# Patient Record
Sex: Female | Born: 1935 | Race: White | Hispanic: No | Smoking: Former smoker
Health system: Southern US, Community
[De-identification: ages and names within clinical notes are randomized; demographics above are authoritative.]

## PROBLEM LIST (undated history)

## (undated) DIAGNOSIS — I10 Essential (primary) hypertension: Secondary | ICD-10-CM

## (undated) DIAGNOSIS — E785 Hyperlipidemia, unspecified: Secondary | ICD-10-CM

## (undated) DIAGNOSIS — T50995A Adverse effect of other drugs, medicaments and biological substances, initial encounter: Secondary | ICD-10-CM

## (undated) DIAGNOSIS — E119 Type 2 diabetes mellitus without complications: Secondary | ICD-10-CM

## (undated) DIAGNOSIS — Z9289 Personal history of other medical treatment: Secondary | ICD-10-CM

## (undated) DIAGNOSIS — M1990 Unspecified osteoarthritis, unspecified site: Secondary | ICD-10-CM

## (undated) DIAGNOSIS — K760 Fatty (change of) liver, not elsewhere classified: Secondary | ICD-10-CM

## (undated) DIAGNOSIS — I639 Cerebral infarction, unspecified: Secondary | ICD-10-CM

## (undated) DIAGNOSIS — I2510 Atherosclerotic heart disease of native coronary artery without angina pectoris: Secondary | ICD-10-CM

## (undated) DIAGNOSIS — I350 Nonrheumatic aortic (valve) stenosis: Secondary | ICD-10-CM

## (undated) DIAGNOSIS — I340 Nonrheumatic mitral (valve) insufficiency: Secondary | ICD-10-CM

## (undated) DIAGNOSIS — G459 Transient cerebral ischemic attack, unspecified: Secondary | ICD-10-CM

## (undated) DIAGNOSIS — I480 Paroxysmal atrial fibrillation: Secondary | ICD-10-CM

## (undated) DIAGNOSIS — K635 Polyp of colon: Secondary | ICD-10-CM

## (undated) DIAGNOSIS — K219 Gastro-esophageal reflux disease without esophagitis: Secondary | ICD-10-CM

## (undated) DIAGNOSIS — F0390 Unspecified dementia without behavioral disturbance: Secondary | ICD-10-CM

## (undated) DIAGNOSIS — I255 Ischemic cardiomyopathy: Secondary | ICD-10-CM

## (undated) HISTORY — DX: Hyperlipidemia, unspecified: E78.5

## (undated) HISTORY — DX: Fatty (change of) liver, not elsewhere classified: K76.0

## (undated) HISTORY — PX: CATARACT EXTRACTION W/ INTRAOCULAR LENS  IMPLANT, BILATERAL: SHX1307

## (undated) HISTORY — PX: ABDOMINAL HYSTERECTOMY: SHX81

## (undated) HISTORY — DX: Polyp of colon: K63.5

## (undated) HISTORY — DX: Essential (primary) hypertension: I10

## (undated) HISTORY — DX: Gastro-esophageal reflux disease without esophagitis: K21.9

## (undated) HISTORY — PX: APPENDECTOMY: SHX54

---
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
## 1999-06-06 HISTORY — PX: CARDIAC CATHETERIZATION: SHX172

## 2000-02-17 ENCOUNTER — Ambulatory Visit (HOSPITAL_COMMUNITY): Admission: RE | Admit: 2000-02-17 | Discharge: 2000-02-17 | Payer: Self-pay | Admitting: Cardiology

## 2004-07-04 ENCOUNTER — Ambulatory Visit: Payer: Self-pay | Admitting: Internal Medicine

## 2005-09-12 ENCOUNTER — Ambulatory Visit: Payer: Self-pay | Admitting: Internal Medicine

## 2006-03-19 ENCOUNTER — Ambulatory Visit: Payer: Self-pay | Admitting: Internal Medicine

## 2006-11-01 ENCOUNTER — Ambulatory Visit: Payer: Self-pay | Admitting: Internal Medicine

## 2007-01-18 ENCOUNTER — Ambulatory Visit (HOSPITAL_COMMUNITY): Admission: RE | Admit: 2007-01-18 | Discharge: 2007-01-18 | Payer: Self-pay | Admitting: Internal Medicine

## 2007-01-18 ENCOUNTER — Ambulatory Visit: Payer: Self-pay | Admitting: Internal Medicine

## 2007-01-18 ENCOUNTER — Encounter: Payer: Self-pay | Admitting: Internal Medicine

## 2007-07-25 ENCOUNTER — Ambulatory Visit (HOSPITAL_COMMUNITY): Admission: RE | Admit: 2007-07-25 | Discharge: 2007-07-25 | Payer: Self-pay | Admitting: Ophthalmology

## 2007-08-22 ENCOUNTER — Ambulatory Visit (HOSPITAL_COMMUNITY): Admission: RE | Admit: 2007-08-22 | Discharge: 2007-08-22 | Payer: Self-pay | Admitting: Ophthalmology

## 2007-09-26 ENCOUNTER — Ambulatory Visit: Payer: Self-pay | Admitting: Internal Medicine

## 2008-09-29 DIAGNOSIS — I2510 Atherosclerotic heart disease of native coronary artery without angina pectoris: Secondary | ICD-10-CM

## 2008-09-29 DIAGNOSIS — Z8639 Personal history of other endocrine, nutritional and metabolic disease: Secondary | ICD-10-CM

## 2008-09-29 DIAGNOSIS — I10 Essential (primary) hypertension: Secondary | ICD-10-CM | POA: Insufficient documentation

## 2008-09-29 DIAGNOSIS — I517 Cardiomegaly: Secondary | ICD-10-CM

## 2008-09-29 DIAGNOSIS — E785 Hyperlipidemia, unspecified: Secondary | ICD-10-CM | POA: Insufficient documentation

## 2008-09-29 DIAGNOSIS — Z860100 Personal history of colon polyps, unspecified: Secondary | ICD-10-CM | POA: Insufficient documentation

## 2008-09-29 DIAGNOSIS — K219 Gastro-esophageal reflux disease without esophagitis: Secondary | ICD-10-CM

## 2008-09-29 DIAGNOSIS — J309 Allergic rhinitis, unspecified: Secondary | ICD-10-CM | POA: Insufficient documentation

## 2008-09-29 DIAGNOSIS — K7689 Other specified diseases of liver: Secondary | ICD-10-CM

## 2008-09-29 DIAGNOSIS — Z862 Personal history of diseases of the blood and blood-forming organs and certain disorders involving the immune mechanism: Secondary | ICD-10-CM

## 2008-09-30 ENCOUNTER — Ambulatory Visit: Payer: Self-pay | Admitting: Internal Medicine

## 2008-09-30 DIAGNOSIS — E119 Type 2 diabetes mellitus without complications: Secondary | ICD-10-CM

## 2008-09-30 DIAGNOSIS — R198 Other specified symptoms and signs involving the digestive system and abdomen: Secondary | ICD-10-CM

## 2008-12-29 ENCOUNTER — Encounter (INDEPENDENT_AMBULATORY_CARE_PROVIDER_SITE_OTHER): Payer: Self-pay

## 2009-01-13 ENCOUNTER — Encounter: Payer: Self-pay | Admitting: Internal Medicine

## 2009-08-20 ENCOUNTER — Encounter (INDEPENDENT_AMBULATORY_CARE_PROVIDER_SITE_OTHER): Payer: Self-pay | Admitting: *Deleted

## 2009-10-13 ENCOUNTER — Ambulatory Visit: Payer: Self-pay | Admitting: Internal Medicine

## 2009-10-27 ENCOUNTER — Encounter: Payer: Self-pay | Admitting: Internal Medicine

## 2010-02-10 ENCOUNTER — Encounter (INDEPENDENT_AMBULATORY_CARE_PROVIDER_SITE_OTHER): Payer: Self-pay | Admitting: *Deleted

## 2010-03-18 ENCOUNTER — Encounter: Payer: Self-pay | Admitting: Internal Medicine

## 2010-04-04 ENCOUNTER — Ambulatory Visit: Payer: Self-pay | Admitting: Internal Medicine

## 2010-04-04 ENCOUNTER — Ambulatory Visit (HOSPITAL_COMMUNITY): Admission: RE | Admit: 2010-04-04 | Discharge: 2010-04-04 | Payer: Self-pay | Admitting: Internal Medicine

## 2010-07-05 NOTE — Letter (Signed)
Summary: TRIAGE ORDER  TRIAGE ORDER   Imported By: Sofie Rower 03/18/2010 11:03:34  _____________________________________________________________________  External Attachment:    Type:   Image     Comment:   External Document

## 2010-07-05 NOTE — Letter (Signed)
Summary: Recall, Screening Colonoscopy Only  [facility]  [address]   [place], [place] [address]   Phone: [phone]  Fax: [phone]    February 10, 2010  CHEYLA DUCHEMIN [address] [place], [place]  [address] Sep 22, 1927   Dear Ms. Modena Nunnery,   [facility] records indicate it is time to schedule your colonoscopy.    Please call our office at [phone] and ask for the nurse.   Thank you,    Burnadette Peter, LPN [facility], [facility] Ph: [phone]   Fax: [phone]

## 2010-07-05 NOTE — Assessment & Plan Note (Signed)
Summary: FU OV 1 YR WITH RMR PER LSL,NASH,GERD/AMS   Visit Type:  Follow-up Visit Primary Care Provider:  tapper  Chief Complaint:  follow up- gerd and nash.  History of Present Illness: Followup history of colonic polyps-tubulovillous adenoma removed from her  colon in 2008, GERD, NASH. Patient is doing well from GI standpoint reflux symptoms well-controlled with Prilosec 20 mg orally daily. Has been on ursodiol 300 mg orally twice daily and vitamin E 800 IUs daily.  Mild constipation over the past couple of months since she fell. She denies rectal bleeding.  Last LFTs through Dr. Rayna Sexton office in August 2010 were normal. Patient fell in the bathtub 3 months and has been having significant left shoulder pain since that time. 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   Wants to be put off colonoscopy until this fall.  Preventive Screening-Counseling & Management  Alcohol-Tobacco     Smoking Status: quit  Current Problems (verified): 1)  Change in Bowels  (ICD-787.99) 2)  Dm  (ICD-250.00) 3)  Hypercholesterolemia  (ICD-272.0) 4)  Coronary Artery Disease  (ICD-414.00) 5)  Ventricular Hypertrophy, Left  (ICD-429.3) 6)  Hypertension  (ICD-401.9) 7)  Allergic Rhinitis, Chronic  (ICD-477.9) 8)  Colonic Polyps, Hx of  (ICD-V12.72) 9)  Gastroesophageal Reflux Disease, Hx of  (ICD-V12.79) 10)  Other Chronic Nonalcoholic Liver Disease  (JQZ-009.2) 11)  Liver Function Tests, Abnormal, Hx of  (ICD-V12.2)  Current Medications (verified): 1)  Atenolol 50 Mg Tabs (Atenolol) .... Once Daily 2)  Hydrochlorothiazide 25 Mg Tabs (Hydrochlorothiazide) .... Once Daily 3)  Allegra 180 Mg Tabs (Fexofenadine Hcl) .... Once Daily 4)  Nasonex 50 Mcg/act Susp (Mometasone Furoate) .... Once Daily 5)  Calcuim 600 + D .... Two Times A Day 6)  Simvastatin 20 Mg Tabs (Simvastatin) .... Once Daily 7)  Multivitamins  Tabs (Multiple Vitamin) .... Once Daily 8)  Vit E 400iu .... Two  Times A Day 9)  Zetia 10 Mg Tabs (Ezetimibe) .... Once Daily 10)  Lisinopril 40 Mg Tabs (Lisinopril) .... Once Daily 11)  Metformin Hcl 500 Mg Tabs (Metformin Hcl) .... Once Daily 12)  Prilosec Otc 20 Mg Tbec (Omeprazole Magnesium) .... Once Daily 13)  Ursodiol 300 Mg Caps (Ursodiol) .... Take 2 Capsules Every Day  Allergies (verified): 1)  ! Pcn 2)  ! * Ivp Dye 3)  ! Sulfa  Family History: Father: deceased Mother: deceased Siblings: 1 brother No FH of Colon Cancer:  Social History: Marital Status: Married Children: 2 Occupation: retired Patient is a former smoker.  Alcohol Use - no Smoking Status:  quit  Vital Signs:  Patient profile:   77 year old female Height:      64 inches Weight:      165 pounds BMI:     28.42 Temp:     98.0 degrees F oral Pulse rate:   88 / minute BP sitting:   112 / 70  (left arm) Cuff size:   regular  Vitals Entered By: Burnadette Peter LPN (Oct 13, 3298 7:62 AM)  Physical Exam  General:  pleasant lady appearing at baseline. Eyes:  no scleral icterus. Lungs:  clear to auscultation Heart:  regular irregular rhythm without murmur gallop rub Abdomen:  nondistended positive bowel sounds soft nontender liver palpable the right costal margin not enlarged by percussion. I do not appreciate a spleen today. No obvious ascites. Msk:  marked left shoulder clavicle tenderness diminished range of motion no bony deformity appreciated.  Impression & Recommendations: Impression: Pleasant 77 year old lady with history of tubulovillous adenoma removed from her colon in 2008.  No GI symptoms aside from  mild constipation these days;  She needs a surveillance colonoscopy. I recommend she have one now. Patient wants to put colonoscopy off until this fall.  GERD symptoms well controlled on Prilosec. We continue that regimen.  History of NASH. Normal LFTs August 20010; will repeat a a liver profile profile now.  She's had significant left shoulder pain since the  fall. Urged her to get back to Dr. Scotty Court ASAP for further evaluation of her symptoms.  We'll put her back on the call list to bring her back in September of this year for a followup colonoscopy.  Other Orders: T-Hepatic Function [phone])  Appended Document: Orders Update    Clinical Lists Changes  Orders: Added new Service order of Est. Patient Level III (92341) - Signed      Appended Document: FU OV 1 YR WITH RMR PER LSL,NASH,GERD/AMS reminder in computer

## 2010-07-05 NOTE — Letter (Signed)
Summary: [facility] Appointment Letter  [facility]  [address]   [place], [place] [address]   Phone: [phone]  Fax: [phone]    08/20/2009  CONTINA STRAIN [address] [place], [place]  [address] 1927-12-12  Dear Ms. Modena Nunnery,   Your physician has indicated that:   _______it is time to schedule an appointment.   _______you missed your appointment on______ and need to call and  reschedule.   _______you need to have lab work done.   _______you need to schedule an appointment to discuss lab or test results.   _______you need to call to reschedule your appointment that was scheduled on _________.   Please call our office at  [phone].    Thank you,    [facility] Ph: [phone]   Fax: [phone]

## 2010-08-17 LAB — GLUCOSE, CAPILLARY: Glucose-Capillary: 118 mg/dL — ABNORMAL HIGH (ref 70–99)

## 2010-10-18 NOTE — Op Note (Signed)
NAMEMESCAL, FLINCHBAUGH                 ACCOUNT NO.:  [id number]   MEDICAL RECORD NO.:  [id number]          PATIENT TYPE:  AMB   LOCATION:  DAY                           FACILITY:  [facility]   PHYSICIAN:  R. Garfield Cornea, M.D. DATE OF BIRTH:  1927-08-04   DATE OF PROCEDURE:  01/18/2007  DATE OF DISCHARGE:                                PROCEDURE NOTE   PROCEDURE:  Colonoscopy with snare polypectomy.   ENDOSCOPIST:  Bridgette Habermann, M.D.   INDICATIONS FOR PROCEDURE:  [age]-year-old lady with a history of  polyps on a prior colonoscopy.  Her last colonoscopy was in [place] in  2003, done by Dr. Lindalou Hose.  Reportedly, she had no polyps at that  time, but did 2 years prior to that exam.  She has no lower GI tract  symptoms.  There is no family history of colorectal neoplasia.  Colonoscopy is now being done as a surveillance maneuver.  This approach  has discussed with the patient at length.  Potential risks, benefits and  alternatives have been reviewed and questions answered; she is  agreeable.  Please see documentation in the medical record.   PROCEDURE NOTE:  O2 saturation, blood pressure, pulse and respirations  were monitored throughout the entire procedure.   CONSCIOUS SEDATION:  Versed 3 mg IV and Demerol 50 mg IV in divided  doses.   INSTRUMENT:  Pentax video chip system.   FINDINGS:  Digital rectal exam revealed no abnormalities.   ENDOSCOPIC FINDINGS:  Prep was adequate.   COLON:  Colonic mucosa was surveyed from the rectosigmoid junction  through the left, transverse and right colon to the area of the  appendical orifice, ileocecal valve and cecum; these structures were  well seen and photographed for the record.  From this level, the scope  was slowly withdrawn and all previously mentioned mucosal surfaces were  again seen.  The patient was noted to have a 5-mm polyp on a stalk in  the mid ascending colon; it was removed with hot snare cautery technique  and recovered  through the scope.  The remainder of the colonic mucosa  appeared normal.  Scope was pulled down to the rectum and a thorough  examination of the rectal mucosa including a retroflexed view of the  anal verge was undertaken.  The patient had an 8-mm pedunculated polyp  and approximately 10 cm from the anal verge; it was resected with hot  snare cautery technique and recovered.  The remaining rectal mucosa  appeared normal.  The patient tolerated the procedure well and was  reactive after endoscopy.   IMPRESSION:  1. Pedunculated rectal polyp, removed as described above, otherwise      normal rectum.  2. Ascending colon polyp removed with hot snare cautery technique.      Remainder of the colon mucosa appeared normal.   RECOMMENDATIONS:  1. No aspirin or arthritis medications for the next 10 days.  2. Follow up on path.  3. Further recommendations to follow.      Bridgette Habermann, M.D.  Electronically Signed     RMR/MEDQ  D:  01/18/2007  T:  01/19/2007  Job:  [id number]   cc:   Matthias Hughs  Fax: [phone]

## 2010-10-18 NOTE — H&P (Signed)
[name], [name]                 ACCOUNT NO.:  [id number]   MEDICAL RECORD NO.:  [id number]          PATIENT TYPE:  AMB   LOCATION:                                FACILITY:  [facility]   PHYSICIAN:  R. Garfield Cornea, M.D. DATE OF BIRTH:  05/11/1928   DATE OF ADMISSION:  DATE OF DISCHARGE:  LH                              HISTORY & PHYSICAL   CHIEF COMPLAINT:  History of colonic polyps.  Due for surveillance  colonoscopy.  Also history of nonalcoholic steatohepatitis, elevated  liver enzymes.   [name] is a pleasant 77 year old lady who last underwent colonoscopy by  Dr. Benson Norway back 5 years ago.  She is overdue for surveillance  and wishes to have her colonoscopy done by me.   It is interesting to note the reason we brought this nice lady in today  is because she has a history of nonalcoholic steatohepatitis, and we  received a hepatic profile from [facility] 2 days ago which revealed  aminotransferases at nearly 1000 with her name, date of birth, telephone  number, etc., on the lab report.  These labs were drawn on Oct 30, 2006.  However it is more interesting to note that [name] tells me  unequivocally that she has not had any lab work drawn (no phlebotomy) in  [place] this month.  The last set of labs she had was done over at  the [facility] in [place] back on September 19, 2006 orchestrated  by Dr. Scotty Court, at which time her GPT and GOT were normal and her  alkaline phosphatase was minimally elevated to 119.   This obviously presents a significant problem for someone else and will  be investigated further, but at this point in time I would conclude that  the hepatic profile on the chart from May 27 belongs to some other  patient.   Ms. Lentsch has not any melena or rectal bleeding.  She does have some  difficulty with personal hygiene, getting clean after she has a bowel  movement.  She is really not taking any fiber supplementation.  Reportedly prior  colonoscopy demonstrated aside from polyps only some  hemorrhoids.  I first saw this lady originally back in February 2004 for  elevated liver enzymes.  She did have mildly elevated transaminase at  that time and alkaline phosphatase.  Iron studies were not elevated.  AMA and ANA came back negative.  Anti-smooth muscle antibody came back  minimally elevated at 120.  Hepatitis B antibody and hepatitis B surface  antigen were negative at that time.  Ultrasound of the abdomen  demonstrated diffuse fatty infiltration of the liver with some  hepatomegaly.  Gallbladder appeared normal.   [name] has been able to accomplish significant weight loss in an  attempt to obtain a more healthy BMI and help with her steatohepatitis.  She has been maintained on Actigall and vitamin E since 2004.  Over the  past 3-4 years her GPT has been minimally elevated in the 50 range.  Alkaline phosphatase has been in the 1-teens to 120 to  130 range.  She  does have some typical gastroesophageal reflux symptoms which are well  controlled on Prilosec 20 mg orally daily.   PAST MEDICAL HISTORY:  1. Chronic allergic rhinitis.  2. Hypertension.  3. Left ventricular hypertrophy.  4. Nonobstructive coronary artery disease.  5. Hypercholesterolemia.  6. Gastroesophageal reflux disease.   PAST SURGERIES:  1. Complete hysterectomy.  2. Appendectomy.  3. Colonoscopies in the past by Dr. Lindalou Hose.   CURRENT MEDICATIONS:  1. Atenolol 50 mg daily.  2. Hydrochlorothiazide 25 mg daily.  3. Allegra 180 mg daily.  4. Nasonex 50 mcg daily.  5. Calcium 200 international units with vitamin D daily.  6. Simvastatin 40 mg daily.  7. Vitamin E 400 units b.i.d. daily.  8. Zetia 10 mg daily.  9. Lisinopril 40 mg daily.  10.Metformin 500 mg daily.  11.Multivitamin daily.  12.Prilosec 20 mg daily.   ALLERGIES:  PENICILLIN, IODINE, SULFA.   SOCIAL HISTORY:  The patient has been married for 52 years.  She has 2  children.   She is a retired Education officer, museum.  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  She is now a nonsmoker, has not smoked  in 15 years.  No alcohol or illicit drug use.   REVIEW OF SYSTEMS:  No recent chest pain or dyspnea on exertion.  Weight  has been stable recently.  She has lost 20 pounds since she first  started seeing me in 2004   PHYSICAL EXAMINATION:  Pleasant 78-year lady resting comfortably.  Weight 167.5, height 5 feet 2-1/2 inches, temperature 97.4, BP 120/60,  pulse 60.  SKIN:  Warm and dry.  There is no jaundice or cutaneous stigmata of  chronic liver disease.  HEENT EXAM:  No scleral icterus.  Conjunctivae are pink.  CHEST:  Lungs are clear to auscultation.  Heart has a regular rate and  rhythm without murmur, gallop or rub.  BREAST EXAM:  Deferred.  ABDOMEN:  Nondistended.  Positive bowel sounds, soft.  Liver edge  palpable at right costal margin.  No appreciable mass or splenomegaly.  EXTREMITIES:  No edema.  RECTAL EXAM:  Deferred until time of colonoscopy.   Labs from Dr. Rayna Sexton office on Oct 19, 2006:  Albumin is 3.9, alkaline  phosphatase 119, SGOT 19, SGPT 19, total bilirubin 0.6.   IMPRESSION:  [name] is a very pleasant 77 year old lady with a  history of nonalcoholic steatohepatitis with now minimal elevation in  alkaline phosphatase.  She has had some difficulties with personal  hygiene with having a bowel movement and a distant history of colonic  polyps.  She is due for colonoscopy now.   As far as polyp surveillance is concerned, will go ahead and set her up  for colonoscopy at her convenience; however, she does want to wait until  August until the hay season is over with.  Potential risks, benefits and  alternatives have been reviewed and questions answered.  She is  agreeable.  I am going to try her on some Metamucil one dose daily and see if this does not facilitate personal hygiene.   Would give some consideration to  backing off or stopping the Actigall.  This lady has been able to lose a good bit of weight and the efficacy of  Actigall in this setting may be somewhat limited.  The same could be  said for vitamin E supplementation at this time.   As stated above, we  have a hepatic profile from [facility] with this  nice lady's identifying information on it with significantly elevated  liver enzymes, but just simply based on the fact that Ms. Truman Hayward has not  had any labs drawn in [place] this month makes me conclude that these  labs pertain to another patient and this matter will be continue to be  investigated by our staff.  Will make further recommendations in the  very near future.      Bridgette Habermann, M.D.  Electronically Signed     RMR/MEDQ  D:  11/01/2006  T:  11/01/2006  Job:  [id number]   cc:   Matthias Hughs  Fax: [phone]

## 2010-10-18 NOTE — Assessment & Plan Note (Signed)
[name], [name]                  CHART#:  [id number]   DATE:  09/26/2007                       DOB:  [birth year]   Follow-up history of elevated LFTs, more recently predominantly elevated  alkaline phosphatase, nonalcoholic fatty liver disease (NASH), history  of gastroesophageal flux disease.  [name] was last seen by me on January 18, 2007 at which time she underwent a colonoscopy and she was found to  have colonic polyps which were removed.  She had tubular adenoma and  tubulovillous adenoma and it was recommendation she return in 3 years  for a follow-up examination.  She has been on Prilosec 20 mg orally  daily in lieu of Prevacid secondary to insurance constraints.  On  balance she liked Prevacid a little better than Prilosec but by and  large Prilosec is doing a pretty good job with controlling her reflux  symptoms.  She is having no odynophagia or dysphagia.  She is on vitamin  E and Ursodiol.  Her alkaline phosphatase has steadily improved. The  last assay I have is from September 16, 2007 when Dr. Scotty Court ordered LFTs.  Her alkaline phosphatase was 101, her SGOT and SGPT were normal along  with the bilirubin.   CURRENT MEDICATIONS:  See updated list.   ALLERGIES:  PENICILLIN, IVP DYE, AND SULFA.   She recently had cataract surgery performed.   PHYSICAL EXAMINATION:  Today she appears well.  Weight 169, height 5  foot 4, temperature 97.7,  BP 130/70, pulse 72.  SKIN:  Warm and dry.  There is no jaundice.  CHEST:  Lungs are clear to auscultation.  HEART:  Regular rate and rhythm without murmur, gallop, rub.  ABDOMEN:  Nondistended, positive bowel sounds, soft, nontender.  Liver  edge palpable right costal margin.  No appreciable spleen. No peripheral  edema.   ASSESSMENT:  1. Gastroesophageal reflux disease symptoms fairly well controlled on      Prilosec. I have advocated weight loss.  She is to continue      Prilosec 20 grams orally daily.  2. History of elevated LFTs,  alkaline phos almost certainly secondary      to NASH related to diabetes and obesity.   RECOMMENDATIONS:  Maximization of glycemic control, weight loss,  exercise,.  Her alkaline phosphatase has been slowly coming down.  Although the evidence is somewhat weak on the use of Urso with vitamin E  in the setting of steatohepatitis it is very much benign therapy and  seems to be helping this nice lady.  Consequently, I have recommended  she continue this approach along with maximization of diet, glycemic  control and weight loss/exercise.   We will get Dr. Scotty Court when she has labs rechecked in 3-6 months to  repeat her liver enzymes once again unless something comes up.  I plan  to see this nice lady back in 1 year.       [name], M.D.  Electronically Signed     RMR/MEDQ  D:  09/26/2007  T:  09/26/2007  Job:  [id number]   cc:   [name]

## 2010-10-21 NOTE — Cardiovascular Report (Signed)
[facility]  Patient:    [name], [name]                        MRN: [id number] Proc. Date: 02/17/00 Adm. Date:  [id number] Attending:  Ernestine Mcmurray CC:         Matthias Hughs, M.D.  Minus Breeding, M.D. [facility]  Cardiopulmonary Laboratory   Cardiac Catheterization  CLINICAL HISTORY:  Ms. 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  She is hypertension and hyperlipidemia and recently was seen by Dr. Percival Spanish with symptoms of exertional chest tightness.  He made a decision with her to evaluate her further with coronary angiography.  DESCRIPTION OF PROCEDURE:  The procedure was performed via the right femoral artery using an arterial sheath and 6 French preformed coronary catheters.  A front wall arterial puncture was performed and Isovue contrast was used. A distal aortogram was performed to rule out renovascular causes for hypertension.  The right femoral artery was closed with Perclose at the end of the procedure.  The patient tolerated the procedure well and left the laboratory in satisfactory condition.  RESULTS:  The left main coronary artery:  The left main coronary artery was free of significant disease.  Left anterior descending:  The left anterior descending artery gave rise to three septal perforators and two diagonal branches.  There was 40% ostial narrowing in the LAD.  Circumflex artery:  The circumflex artery was a moderate sized vessel that gave rise to an intermediate branch, a small and large marginal branch, an atrial branch and a posterolateral branch.  These vessels were free of significant disease.  Right coronary artery:  The right coronary was a moderate sized vessel that gave rise to a conus branch, two right ventricular branches, a posterior descending branch, and a posterolateral branch.  There was 30% narrowing in the proximal portion of the vessel.  LEFT  VENTRICULOGRAPHY:  The left ventriculogram performed in the RAO projection showed good wall motion with no areas of hypokinesis.  There appeared to be left ventricular hypertrophy.  The estimated ejection fraction was 70%.  DISTAL AORTOGRAM:  A distal aortogram was performed which showed patent renal arteries and no aortoiliac obstruction.  The aortic pressure was 194/97 with a mean of 140.  The left ventricular pressure was 194/21.  CONCLUSIONS: 1. Nonobstructive coronary artery disease with 40% narrowing in the    ostium of the left anterior descending artery and 30% narrowing in the    proximal right coronary artery with normal left ventricular function with    left ventricular hypertrophy. 2. Hypertensive heart disease.  RECOMMENDATIONS:  There is no major coronary obstruction.  The patients exertional dyspnea could be related to hypertensive disease and diastolic dysfunction.  We will plan to increase her Altace as suggested by Dr. Percival Spanish and arrange followup with Dr. Scotty Court. DD:  02/17/00 TD:  02/17/00 Job: [id number]

## 2011-02-01 DIAGNOSIS — G459 Transient cerebral ischemic attack, unspecified: Secondary | ICD-10-CM

## 2011-02-02 DIAGNOSIS — I4891 Unspecified atrial fibrillation: Secondary | ICD-10-CM

## 2011-02-03 DIAGNOSIS — G459 Transient cerebral ischemic attack, unspecified: Secondary | ICD-10-CM

## 2011-02-03 DIAGNOSIS — Q211 Atrial septal defect: Secondary | ICD-10-CM

## 2011-02-03 DIAGNOSIS — Q2111 Secundum atrial septal defect: Secondary | ICD-10-CM

## 2011-02-07 ENCOUNTER — Ambulatory Visit (INDEPENDENT_AMBULATORY_CARE_PROVIDER_SITE_OTHER): Payer: Medicare Other | Admitting: *Deleted

## 2011-02-07 DIAGNOSIS — Z7901 Long term (current) use of anticoagulants: Secondary | ICD-10-CM | POA: Insufficient documentation

## 2011-02-07 DIAGNOSIS — G459 Transient cerebral ischemic attack, unspecified: Secondary | ICD-10-CM

## 2011-02-07 DIAGNOSIS — Q211 Atrial septal defect: Secondary | ICD-10-CM | POA: Insufficient documentation

## 2011-02-07 LAB — POCT INR: INR: 1.8

## 2011-02-09 DIAGNOSIS — R072 Precordial pain: Secondary | ICD-10-CM

## 2011-02-10 ENCOUNTER — Encounter: Payer: Self-pay | Admitting: Cardiovascular Disease

## 2011-02-14 ENCOUNTER — Ambulatory Visit (INDEPENDENT_AMBULATORY_CARE_PROVIDER_SITE_OTHER): Payer: Medicare Other | Admitting: *Deleted

## 2011-02-14 DIAGNOSIS — Q211 Atrial septal defect: Secondary | ICD-10-CM

## 2011-02-14 DIAGNOSIS — G459 Transient cerebral ischemic attack, unspecified: Secondary | ICD-10-CM

## 2011-02-14 DIAGNOSIS — Z7901 Long term (current) use of anticoagulants: Secondary | ICD-10-CM

## 2011-02-24 ENCOUNTER — Ambulatory Visit (INDEPENDENT_AMBULATORY_CARE_PROVIDER_SITE_OTHER): Payer: Medicare Other | Admitting: Cardiology

## 2011-02-24 ENCOUNTER — Ambulatory Visit (INDEPENDENT_AMBULATORY_CARE_PROVIDER_SITE_OTHER): Payer: Medicare Other | Admitting: *Deleted

## 2011-02-24 ENCOUNTER — Encounter: Payer: Self-pay | Admitting: Cardiology

## 2011-02-24 VITALS — BP 130/83 | HR 57 | Ht 64.0 in | Wt 169.0 lb

## 2011-02-24 DIAGNOSIS — E7800 Pure hypercholesterolemia, unspecified: Secondary | ICD-10-CM

## 2011-02-24 DIAGNOSIS — Z7901 Long term (current) use of anticoagulants: Secondary | ICD-10-CM

## 2011-02-24 DIAGNOSIS — Q211 Atrial septal defect: Secondary | ICD-10-CM

## 2011-02-24 DIAGNOSIS — I2510 Atherosclerotic heart disease of native coronary artery without angina pectoris: Secondary | ICD-10-CM

## 2011-02-24 DIAGNOSIS — G459 Transient cerebral ischemic attack, unspecified: Secondary | ICD-10-CM

## 2011-02-24 LAB — BASIC METABOLIC PANEL
CO2: 30
Chloride: 100
Glucose, Bld: 83
Potassium: 4
Sodium: 137

## 2011-02-24 MED ORDER — WARFARIN SODIUM 5 MG PO TABS: 5.0000 mg | ORAL_TABLET | Freq: Every day | ORAL | Status: DC

## 2011-02-24 MED ORDER — NITROGLYCERIN 0.4 MG SL SUBL: 0.4000 mg | SUBLINGUAL_TABLET | SUBLINGUAL | Status: DC | PRN

## 2011-02-24 NOTE — Assessment & Plan Note (Signed)
Identified by saline contrast study on a limited echo. There was right-to-left shunting. 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

## 2011-02-24 NOTE — Assessment & Plan Note (Signed)
The patient does report some chest tightness on exertion but this is not always consistent at other times when she works in the yard she has no symptoms. She had a recent negative stress test. I provided her with some sublingual nitroglycerin. If she has ongoing symptoms or her symptoms worsen certainly a cardiac catheterization could be considered. The patient is a prior history of nonobstructive coronary artery disease.

## 2011-02-24 NOTE — Patient Instructions (Signed)
Your physician wants you to follow-up in: 6 months. You will receive a reminder letter in the mail one-two months in advance. If you don't receive a letter, please call our office to schedule the follow-up appointment. Your physician recommends that you continue on your current medications as directed. Please refer to the Current Medication list given to you today. Use NTG as needed/as directed.

## 2011-02-24 NOTE — Assessment & Plan Note (Signed)
Continue lifelong Coumadin therapy unless any contraindications given her recent TIA associated with a PFO. Aspirin was also added low dose.

## 2011-02-24 NOTE — Assessment & Plan Note (Signed)
On Coumadin therapy no recurrence

## 2011-02-24 NOTE — Assessment & Plan Note (Signed)
Continue risk factor modification the patient on statin drug therapy.

## 2011-02-24 NOTE — Progress Notes (Signed)
HPI The patient is an 76 year old female recently presented with dysarthria. She was felt to have a TIA and eventually a patent foramen ovale was affected with right-to-left shunt as well as evidence of right ventricular aneurysm. Followup stress testing was negative for ischemia. The patient an MRI of the brain which was within normal limits. There was a vas atrophy and extensive white matter change. Carotid Doppler showed no significant stenosis. He was on noncontrasted head CT a vague area of low attenuation in the right occipital lobe reflective of an acute or subacute infarct. The patient was placed on warfarin therapy. She has done well. She had no recurrent symptoms suggestive of TIA. The patient does report occasionally when she is walking particularly when she goes for a walk with her doctors some substernal chest tightness. There is also mild associated shortness of breath. There is no radiation of the chest pressure. However on today's the patient is able to work in the yard and pick up limbs as well as right her riding lawnmower without any difficulty. She experienced some pressure in the left side when laying in bed. She denies any palpitations presyncope orthopnea PND.  Allergies  Allergen Reactions  . Ivp Dye (Iodinated Diagnostic Agents)   . Penicillins     REACTION: unknown reaction  . Sulfonamide Derivatives     REACTION: unknown reaction    No current outpatient prescriptions on file prior to visit.    Past Medical History  Diagnosis Date  . Hypertension   . Diabetes mellitus   . Nonalcoholic hepatosteatosis   . GERD (gastroesophageal reflux disease)   . Allergic rhinitis   . Hyperlipidemia   . Colonic polyp     Past Surgical History  Procedure Date  . Appendectomy   . Cardiac catheterization 2005  . Abdominal hysterectomy     Family History  Problem Relation Age of Onset  . Heart failure Mother   . Prostate cancer Father   . Prostate cancer Brother      History   Social History  . Marital Status: Married    Spouse Name: N/A    Number of Children: N/A  . Years of Education: N/A   Occupational History  . Not on file.   Social History Main Topics  . Smoking status: Former Smoker -- 2.0 packs/day for 40 years    Types: Cigarettes    Quit date: 06/05/1992  . Smokeless tobacco: Former Systems developer    Types: [place] date: 12/03/1992   Comment: used tobacco to help quit smoking  . Alcohol Use: Not on file  . Drug Use: Not on file  . Sexually Active: Not on file   Other Topics Concern  . Not on file   Social History Narrative  . No narrative on file    YHC:WCBJSEGBT positives as outlined above. The remainder of the 18  point review of systems is negative  PHYSICAL EXAM BP 130/83  Pulse 57  Ht 5' 4"  (1.626 m)  Wt 169 lb (76.658 kg)  BMI 29.01 kg/m2 General: Well-developed, well-nourished in no distress Head: Normocephalic and atraumatic Eyes:PERRLA/EOMI intact, conjunctiva and lids normal Ears: No deformity or lesions Mouth:normal dentition, normal posterior pharynx Neck: Supple, no JVD.  No masses, thyromegaly or abnormal cervical nodes Lungs: Normal breath sounds bilaterally without wheezing.  Normal percussion Cardiac: regular rate and rhythm with normal S1 and S2, no S3 or S4.  PMI is normal.  No pathological murmurs Abdomen: Normal bowel sounds, abdomen is  soft and nontender without masses, organomegaly or hernias noted.  No hepatosplenomegaly MSK: Back normal, normal gait muscle strength and tone normal Vascular: Pulse is normal in all 4 extremities Extremities: No peripheral pitting edema Neurologic: Alert and oriented x 3 Skin: Intact without lesions or rashes Lymphatics: No significant adenopathy Psychologic: Normal affect    ECG: Not obtained  ASSESSMENT AND PLAN

## 2011-03-03 ENCOUNTER — Ambulatory Visit (INDEPENDENT_AMBULATORY_CARE_PROVIDER_SITE_OTHER): Payer: Medicare Other | Admitting: *Deleted

## 2011-03-03 ENCOUNTER — Encounter: Payer: Self-pay | Admitting: Internal Medicine

## 2011-03-03 DIAGNOSIS — G459 Transient cerebral ischemic attack, unspecified: Secondary | ICD-10-CM

## 2011-03-03 DIAGNOSIS — Z7901 Long term (current) use of anticoagulants: Secondary | ICD-10-CM

## 2011-03-03 DIAGNOSIS — Q211 Atrial septal defect: Secondary | ICD-10-CM

## 2011-03-03 DIAGNOSIS — Q2112 Patent foramen ovale: Secondary | ICD-10-CM

## 2011-03-03 LAB — POCT INR: INR: 2.5

## 2011-03-17 ENCOUNTER — Ambulatory Visit (INDEPENDENT_AMBULATORY_CARE_PROVIDER_SITE_OTHER): Payer: Medicare Other | Admitting: *Deleted

## 2011-03-17 DIAGNOSIS — Z7901 Long term (current) use of anticoagulants: Secondary | ICD-10-CM

## 2011-03-17 DIAGNOSIS — G459 Transient cerebral ischemic attack, unspecified: Secondary | ICD-10-CM

## 2011-03-17 DIAGNOSIS — Q211 Atrial septal defect: Secondary | ICD-10-CM

## 2011-03-17 LAB — POCT INR: INR: 3.2

## 2011-04-04 ENCOUNTER — Ambulatory Visit (INDEPENDENT_AMBULATORY_CARE_PROVIDER_SITE_OTHER): Payer: Medicare Other | Admitting: *Deleted

## 2011-04-04 DIAGNOSIS — Q211 Atrial septal defect: Secondary | ICD-10-CM

## 2011-04-04 DIAGNOSIS — Z7901 Long term (current) use of anticoagulants: Secondary | ICD-10-CM

## 2011-04-04 DIAGNOSIS — G459 Transient cerebral ischemic attack, unspecified: Secondary | ICD-10-CM

## 2011-04-04 LAB — POCT INR: INR: 2.3

## 2011-05-02 ENCOUNTER — Other Ambulatory Visit: Payer: Self-pay | Admitting: *Deleted

## 2011-05-02 ENCOUNTER — Ambulatory Visit (INDEPENDENT_AMBULATORY_CARE_PROVIDER_SITE_OTHER): Payer: Medicare Other | Admitting: *Deleted

## 2011-05-02 DIAGNOSIS — Z7901 Long term (current) use of anticoagulants: Secondary | ICD-10-CM

## 2011-05-02 DIAGNOSIS — Q211 Atrial septal defect: Secondary | ICD-10-CM

## 2011-05-02 DIAGNOSIS — G459 Transient cerebral ischemic attack, unspecified: Secondary | ICD-10-CM

## 2011-05-02 LAB — POCT INR: INR: 2.3

## 2011-05-02 MED ORDER — NITROGLYCERIN 0.4 MG SL SUBL: 0.4000 mg | SUBLINGUAL_TABLET | SUBLINGUAL | Status: DC | PRN

## 2011-05-02 MED ORDER — WARFARIN SODIUM 5 MG PO TABS: 5.0000 mg | ORAL_TABLET | Freq: Every day | ORAL | Status: DC

## 2011-06-02 ENCOUNTER — Ambulatory Visit (INDEPENDENT_AMBULATORY_CARE_PROVIDER_SITE_OTHER): Payer: Medicare Other | Admitting: *Deleted

## 2011-06-02 DIAGNOSIS — Z7901 Long term (current) use of anticoagulants: Secondary | ICD-10-CM

## 2011-06-02 DIAGNOSIS — Q211 Atrial septal defect: Secondary | ICD-10-CM

## 2011-06-02 DIAGNOSIS — G459 Transient cerebral ischemic attack, unspecified: Secondary | ICD-10-CM

## 2011-06-02 LAB — POCT INR: INR: 1.7

## 2011-06-23 ENCOUNTER — Ambulatory Visit (INDEPENDENT_AMBULATORY_CARE_PROVIDER_SITE_OTHER): Payer: Medicare Other | Admitting: *Deleted

## 2011-06-23 DIAGNOSIS — Q211 Atrial septal defect: Secondary | ICD-10-CM

## 2011-06-23 DIAGNOSIS — Z7901 Long term (current) use of anticoagulants: Secondary | ICD-10-CM

## 2011-06-23 DIAGNOSIS — G459 Transient cerebral ischemic attack, unspecified: Secondary | ICD-10-CM

## 2011-07-14 ENCOUNTER — Ambulatory Visit (INDEPENDENT_AMBULATORY_CARE_PROVIDER_SITE_OTHER): Payer: Medicare Other | Admitting: *Deleted

## 2011-07-14 DIAGNOSIS — G459 Transient cerebral ischemic attack, unspecified: Secondary | ICD-10-CM

## 2011-07-14 DIAGNOSIS — Q211 Atrial septal defect: Secondary | ICD-10-CM

## 2011-07-14 DIAGNOSIS — Q2112 Patent foramen ovale: Secondary | ICD-10-CM

## 2011-07-14 DIAGNOSIS — Z7901 Long term (current) use of anticoagulants: Secondary | ICD-10-CM

## 2011-07-14 MED ORDER — WARFARIN SODIUM 5 MG PO TABS: 5.0000 mg | ORAL_TABLET | Freq: Every day | ORAL | Status: DC

## 2011-08-11 ENCOUNTER — Ambulatory Visit (INDEPENDENT_AMBULATORY_CARE_PROVIDER_SITE_OTHER): Payer: Medicare Other | Admitting: *Deleted

## 2011-08-11 DIAGNOSIS — G459 Transient cerebral ischemic attack, unspecified: Secondary | ICD-10-CM

## 2011-08-11 DIAGNOSIS — Q211 Atrial septal defect: Secondary | ICD-10-CM

## 2011-08-11 DIAGNOSIS — Z7901 Long term (current) use of anticoagulants: Secondary | ICD-10-CM

## 2011-09-08 ENCOUNTER — Ambulatory Visit (INDEPENDENT_AMBULATORY_CARE_PROVIDER_SITE_OTHER): Payer: Medicare Other | Admitting: *Deleted

## 2011-09-08 DIAGNOSIS — Z7901 Long term (current) use of anticoagulants: Secondary | ICD-10-CM

## 2011-09-08 DIAGNOSIS — Q211 Atrial septal defect: Secondary | ICD-10-CM

## 2011-09-08 DIAGNOSIS — G459 Transient cerebral ischemic attack, unspecified: Secondary | ICD-10-CM

## 2011-10-20 ENCOUNTER — Ambulatory Visit (INDEPENDENT_AMBULATORY_CARE_PROVIDER_SITE_OTHER): Payer: Medicare Other | Admitting: *Deleted

## 2011-10-20 DIAGNOSIS — Z7901 Long term (current) use of anticoagulants: Secondary | ICD-10-CM

## 2011-10-20 DIAGNOSIS — Q211 Atrial septal defect: Secondary | ICD-10-CM

## 2011-10-20 DIAGNOSIS — G459 Transient cerebral ischemic attack, unspecified: Secondary | ICD-10-CM

## 2011-11-24 ENCOUNTER — Ambulatory Visit (INDEPENDENT_AMBULATORY_CARE_PROVIDER_SITE_OTHER): Payer: Medicare Other | Admitting: *Deleted

## 2011-11-24 DIAGNOSIS — G459 Transient cerebral ischemic attack, unspecified: Secondary | ICD-10-CM

## 2011-11-24 DIAGNOSIS — Q211 Atrial septal defect: Secondary | ICD-10-CM

## 2011-11-24 DIAGNOSIS — Z7901 Long term (current) use of anticoagulants: Secondary | ICD-10-CM

## 2011-12-15 ENCOUNTER — Ambulatory Visit (INDEPENDENT_AMBULATORY_CARE_PROVIDER_SITE_OTHER): Payer: Medicare Other | Admitting: *Deleted

## 2011-12-15 DIAGNOSIS — Q211 Atrial septal defect: Secondary | ICD-10-CM

## 2011-12-15 DIAGNOSIS — Z7901 Long term (current) use of anticoagulants: Secondary | ICD-10-CM

## 2011-12-15 DIAGNOSIS — G459 Transient cerebral ischemic attack, unspecified: Secondary | ICD-10-CM

## 2012-01-12 ENCOUNTER — Ambulatory Visit (INDEPENDENT_AMBULATORY_CARE_PROVIDER_SITE_OTHER): Payer: Medicare Other | Admitting: *Deleted

## 2012-01-12 DIAGNOSIS — G459 Transient cerebral ischemic attack, unspecified: Secondary | ICD-10-CM

## 2012-01-12 DIAGNOSIS — Z7901 Long term (current) use of anticoagulants: Secondary | ICD-10-CM

## 2012-01-12 DIAGNOSIS — Q211 Atrial septal defect: Secondary | ICD-10-CM

## 2012-01-12 LAB — POCT INR: INR: 2.1

## 2012-02-09 ENCOUNTER — Ambulatory Visit (INDEPENDENT_AMBULATORY_CARE_PROVIDER_SITE_OTHER): Payer: Medicare Other | Admitting: *Deleted

## 2012-02-09 DIAGNOSIS — G459 Transient cerebral ischemic attack, unspecified: Secondary | ICD-10-CM

## 2012-02-09 DIAGNOSIS — Q211 Atrial septal defect: Secondary | ICD-10-CM

## 2012-02-09 DIAGNOSIS — Z7901 Long term (current) use of anticoagulants: Secondary | ICD-10-CM

## 2012-02-15 ENCOUNTER — Ambulatory Visit: Payer: Medicare Other | Admitting: Cardiology

## 2012-02-16 ENCOUNTER — Ambulatory Visit (INDEPENDENT_AMBULATORY_CARE_PROVIDER_SITE_OTHER): Payer: Medicare Other | Admitting: Cardiology

## 2012-02-16 ENCOUNTER — Encounter: Payer: Self-pay | Admitting: Cardiology

## 2012-02-16 VITALS — BP 150/78 | HR 69 | Ht 64.0 in | Wt 172.0 lb

## 2012-02-16 DIAGNOSIS — G459 Transient cerebral ischemic attack, unspecified: Secondary | ICD-10-CM

## 2012-02-16 DIAGNOSIS — I10 Essential (primary) hypertension: Secondary | ICD-10-CM

## 2012-02-16 DIAGNOSIS — Q211 Atrial septal defect: Secondary | ICD-10-CM

## 2012-02-16 DIAGNOSIS — I2510 Atherosclerotic heart disease of native coronary artery without angina pectoris: Secondary | ICD-10-CM

## 2012-02-16 DIAGNOSIS — I517 Cardiomegaly: Secondary | ICD-10-CM

## 2012-02-16 DIAGNOSIS — E7800 Pure hypercholesterolemia, unspecified: Secondary | ICD-10-CM

## 2012-02-16 MED ORDER — SPIRONOLACTONE 25 MG PO TABS: 25.0000 mg | ORAL_TABLET | Freq: Every day | ORAL | Status: DC

## 2012-02-16 NOTE — Progress Notes (Signed)
HPI The patient presents for followup of a history of TIA, nonobstructive coronary disease and a PFO. Since she was last seen she has not been hospitalized. She has had no chest pressure other than some usual discomfort she gets intermittently in her upper epigastric area. She has some chronic dyspnea with exertion but this is unchanged. She's not having any PND or orthopnea. She's not had any palpitations, presyncope or syncope. She's had no neurologic complaints. She has very mild lower extremity swelling which she's been on her feet for a while. She tolerates blood thinners without any evidence of bleeding.  Allergies  Allergen Reactions  . Ivp Dye (Iodinated Diagnostic Agents)   . Penicillins     REACTION: unknown reaction  . Sulfonamide Derivatives     REACTION: unknown reaction    Current Outpatient Prescriptions  Medication Sig Dispense Refill  . aspirin EC 81 MG tablet Take 81 mg by mouth daily.        Marland Kitchen atenolol (TENORMIN) 50 MG tablet Take 50 mg by mouth daily.        . Calcium Carbonate-Vitamin D (CALTRATE 600+D) 600-400 MG-UNIT per tablet Take 2 tablets by mouth daily.       . carboxymethylcellulose (REFRESH) 1 % ophthalmic solution Place 1 drop into both eyes daily.        . fexofenadine (ALLEGRA) 180 MG tablet Take 180 mg by mouth daily.        . hydrochlorothiazide (HYDRODIURIL) 25 MG tablet Take 25 mg by mouth daily.        Marland Kitchen losartan (COZAAR) 100 MG tablet Take 100 mg by mouth daily.      . metFORMIN (GLUCOPHAGE-XR) 500 MG 24 hr tablet Take 500 mg by mouth daily with breakfast.        . mometasone (NASONEX) 50 MCG/ACT nasal spray Place 2 sprays into the nose daily.        . Multiple Vitamin (MULTIVITAMIN) tablet Take 1 tablet by mouth daily.        . nitroGLYCERIN (NITROSTAT) 0.4 MG SL tablet Place 1 tablet (0.4 mg total) under the tongue every 5 (five) minutes as needed for chest pain.  100 tablet  3  . omeprazole (PRILOSEC) 20 MG capsule Take 20 mg by mouth daily.         . simvastatin (ZOCOR) 20 MG tablet Take 20 mg by mouth at bedtime.        . ursodiol (ACTIGALL) 300 MG capsule Take 300 mg by mouth 2 (two) times daily.        . vitamin E 400 UNIT capsule Take 400 Units by mouth 2 (two) times daily.        Marland Kitchen warfarin (JANTOVEN) 5 MG tablet Take 1 tablet (5 mg total) by mouth daily.  90 tablet  3    Past Medical History  Diagnosis Date  . Hypertension   . Diabetes mellitus   . Nonalcoholic hepatosteatosis   . GERD (gastroesophageal reflux disease)   . Allergic rhinitis   . Hyperlipidemia   . Colonic polyp     Past Surgical History  Procedure Date  . Appendectomy   . Cardiac catheterization 2005  . Abdominal hysterectomy     ROS:  As stated in the HPI and negative for all other systems.  PHYSICAL EXAM BP 150/78  Ht 5' 4"  (1.626 m)  Wt 172 lb (78.019 kg)  BMI 29.52 kg/m2 GENERAL:  Well appearing HEENT:  Pupils equal round and reactive, fundi not visualized,  oral mucosa unremarkable NECK:  No jugular venous distention, waveform within normal limits, carotid upstroke brisk and symmetric, no bruits, no thyromegaly LYMPHATICS:  No cervical, inguinal adenopathy LUNGS:  Clear to auscultation bilaterally BACK:  No CVA tenderness CHEST:  Unremarkable HEART:  PMI not displaced or sustained,S1 and S2 within normal limits, no S3, no S4, no clicks, no rubs, no murmurs ABD:  Flat, positive bowel sounds normal in frequency in pitch, no bruits, no rebound, no guarding, no midline pulsatile mass, no hepatomegaly, no splenomegaly EXT:  2 plus pulses throughout, no edema, no cyanosis no clubbing SKIN:  No rashes no nodules NEURO:  Cranial nerves II through XII grossly intact, motor grossly intact throughout PSYCH:  Cognitively intact, oriented to person place and time   EKG:  Sinus rhythm, rate 69, left axis deviation, poor anterior R wave progression, no acute ST-T wave changes. 02/16/2012   ASSESSMENT AND PLAN  TIA (transient ischemic attack) -    She has had no recurrence. She'll remain on anticoagulation as below.  PFO (patent foramen ovale) -  Identified by saline contrast study on a limited echo. There was right-to-left shunting. 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  However, she's had no symptoms. No imaging is indicated at this point.   CORONARY ARTERY DISEASE -  She has had nonobstructive disease in the past with a negative stress perfusion study in September of last year. No change in therapy is indicated. She will continue with risk reduction.   HYPERTENSION Her blood pressure is not at target.  I am going start spironolactone.  She will have appropriate followup of potassium and renal function.   Encounter for long-term (current) use of anticoagulants -  Continue lifelong Coumadin therapy unless any contraindications given her recent TIA associated with a PFO. Aspirin was also added low dose.

## 2012-02-16 NOTE — Patient Instructions (Addendum)
Your physician recommends that you schedule a follow-up appointment in: 6 months. You will receive a reminder letter in the mail in about 4 months reminding you to call and schedule your appointment. If you don't receive this letter, please contact our office.  Your physician has recommended you make the following change in your medication: Start spironolactone 25 mg daily. All other medication will remain the same. Your new prescription has been sent to your pharmacy. Your physician recommends that you return for lab work for BMET ON 02/21/12 AGAIN ON 03/01/12 AND AGAIN ON 04/17/12 AT [facility].

## 2012-02-21 ENCOUNTER — Encounter: Payer: Self-pay | Admitting: Cardiology

## 2012-02-26 ENCOUNTER — Telehealth: Payer: Self-pay | Admitting: *Deleted

## 2012-02-26 NOTE — Telephone Encounter (Signed)
Notes Recorded by Laurine Blazer, LPN on 07/23/2881 at 3:74 AM Patient notified.

## 2012-02-26 NOTE — Telephone Encounter (Signed)
Message copied by Laurine Blazer on Mon Feb 26, 2012  9:00 AM ------      Message from: Minus Breeding      Created: Sun Feb 25, 2012  4:22 PM       Labs OK.  Call Ms. Sebree with the results and send results to [facility] B, MD

## 2012-03-05 ENCOUNTER — Ambulatory Visit (INDEPENDENT_AMBULATORY_CARE_PROVIDER_SITE_OTHER): Payer: Medicare Other | Admitting: *Deleted

## 2012-03-05 DIAGNOSIS — G459 Transient cerebral ischemic attack, unspecified: Secondary | ICD-10-CM

## 2012-03-05 DIAGNOSIS — Q211 Atrial septal defect: Secondary | ICD-10-CM

## 2012-03-05 DIAGNOSIS — Z7901 Long term (current) use of anticoagulants: Secondary | ICD-10-CM

## 2012-03-05 LAB — POCT INR: INR: 2.7

## 2012-03-07 ENCOUNTER — Telehealth: Payer: Self-pay | Admitting: *Deleted

## 2012-03-07 NOTE — Telephone Encounter (Signed)
Patient informed and copy sent to PCP. 

## 2012-03-07 NOTE — Telephone Encounter (Signed)
Message copied by Merlene Laughter on Thu Mar 07, 2012 10:33 AM ------      Message from: Minus Breeding      Created: Thu Mar 07, 2012  7:50 AM       Labs OK.  Call Ms. Lefever with the results and send results to [facility] B, MD

## 2012-04-02 ENCOUNTER — Ambulatory Visit (INDEPENDENT_AMBULATORY_CARE_PROVIDER_SITE_OTHER): Payer: Medicare Other | Admitting: *Deleted

## 2012-04-02 DIAGNOSIS — Q211 Atrial septal defect: Secondary | ICD-10-CM

## 2012-04-02 DIAGNOSIS — G459 Transient cerebral ischemic attack, unspecified: Secondary | ICD-10-CM

## 2012-04-02 DIAGNOSIS — Z7901 Long term (current) use of anticoagulants: Secondary | ICD-10-CM

## 2012-04-02 LAB — POCT INR: INR: 2.9

## 2012-04-19 ENCOUNTER — Telehealth: Payer: Self-pay | Admitting: *Deleted

## 2012-04-19 NOTE — Telephone Encounter (Signed)
Patient informed and said she has already seen her family doctor on yesterday about the A1C elevation.

## 2012-04-19 NOTE — Telephone Encounter (Signed)
Message copied by Merlene Laughter on Fri Apr 19, 2012 10:31 AM ------      Message from: Minus Breeding      Created: Thu Apr 18, 2012  1:43 PM       Labs are OK except that her A1C is not well controlled.  She needs to follow with her primary MD for this.

## 2012-04-30 ENCOUNTER — Other Ambulatory Visit: Payer: Self-pay | Admitting: *Deleted

## 2012-04-30 ENCOUNTER — Ambulatory Visit (INDEPENDENT_AMBULATORY_CARE_PROVIDER_SITE_OTHER): Payer: Medicare Other | Admitting: *Deleted

## 2012-04-30 DIAGNOSIS — Q211 Atrial septal defect: Secondary | ICD-10-CM

## 2012-04-30 DIAGNOSIS — Z7901 Long term (current) use of anticoagulants: Secondary | ICD-10-CM

## 2012-04-30 DIAGNOSIS — G459 Transient cerebral ischemic attack, unspecified: Secondary | ICD-10-CM

## 2012-04-30 LAB — POCT INR: INR: 2.7

## 2012-04-30 MED ORDER — ASPIRIN EC 81 MG PO TBEC: 81.0000 mg | DELAYED_RELEASE_TABLET | Freq: Every day | ORAL | Status: DC

## 2012-06-04 ENCOUNTER — Ambulatory Visit (INDEPENDENT_AMBULATORY_CARE_PROVIDER_SITE_OTHER): Payer: Medicare Other | Admitting: *Deleted

## 2012-06-04 DIAGNOSIS — G459 Transient cerebral ischemic attack, unspecified: Secondary | ICD-10-CM

## 2012-06-04 DIAGNOSIS — Z7901 Long term (current) use of anticoagulants: Secondary | ICD-10-CM

## 2012-06-04 DIAGNOSIS — Q211 Atrial septal defect: Secondary | ICD-10-CM

## 2012-06-04 LAB — POCT INR: INR: 2.2

## 2012-07-16 ENCOUNTER — Ambulatory Visit (INDEPENDENT_AMBULATORY_CARE_PROVIDER_SITE_OTHER): Payer: Medicare Other | Admitting: *Deleted

## 2012-07-16 DIAGNOSIS — Z7901 Long term (current) use of anticoagulants: Secondary | ICD-10-CM

## 2012-07-16 DIAGNOSIS — G459 Transient cerebral ischemic attack, unspecified: Secondary | ICD-10-CM

## 2012-07-16 DIAGNOSIS — Q211 Atrial septal defect: Secondary | ICD-10-CM

## 2012-07-31 ENCOUNTER — Encounter: Payer: Self-pay | Admitting: Cardiovascular Disease

## 2012-08-20 ENCOUNTER — Encounter: Payer: Self-pay | Admitting: Cardiology

## 2012-08-20 ENCOUNTER — Ambulatory Visit (INDEPENDENT_AMBULATORY_CARE_PROVIDER_SITE_OTHER): Payer: Medicare Other | Admitting: Cardiology

## 2012-08-20 ENCOUNTER — Ambulatory Visit (INDEPENDENT_AMBULATORY_CARE_PROVIDER_SITE_OTHER): Payer: Medicare Other | Admitting: *Deleted

## 2012-08-20 VITALS — BP 121/75 | HR 67 | Ht 64.0 in | Wt 176.8 lb

## 2012-08-20 DIAGNOSIS — G459 Transient cerebral ischemic attack, unspecified: Secondary | ICD-10-CM

## 2012-08-20 DIAGNOSIS — Q211 Atrial septal defect: Secondary | ICD-10-CM

## 2012-08-20 DIAGNOSIS — I10 Essential (primary) hypertension: Secondary | ICD-10-CM

## 2012-08-20 DIAGNOSIS — Z7901 Long term (current) use of anticoagulants: Secondary | ICD-10-CM

## 2012-08-20 LAB — POCT INR: INR: 2.4

## 2012-08-20 NOTE — Patient Instructions (Signed)
Continue all current medications. Your physician wants you to follow up in: 6 months.  You will receive a reminder letter in the mail one-two months in advance.  If you don't receive a letter, please call our office to schedule the follow up appointment   

## 2012-08-20 NOTE — Progress Notes (Signed)
HPI The patient presents for followup of a history of TIA, nonobstructive coronary disease and a PFO. Since I last saw her she thinks that she has done well.  . She has had no chest pressure.  She walks daily without limiting symptoms.  She's not having any PND or orthopnea. She's not had any palpitations, presyncope or syncope.  She does get the discomfort. However, this has been a stable pattern for some time and unchanged and she had her stress test previously. She says she has taken about 10 nitroglycerin in the last 6 months. She'll take this with the arm pain and the pain will go away quickly. She doesn't describe associated symptoms. She can't do her walking or vacuuming without bringing this on. She's not had any new neurologic complaints such as visual or speech disturbances she has no motor deficit.   Allergies  Allergen Reactions  . Ivp Dye (Iodinated Diagnostic Agents)   . Penicillins     REACTION: unknown reaction  . Sulfonamide Derivatives     REACTION: unknown reaction    Current Outpatient Prescriptions  Medication Sig Dispense Refill  . aspirin EC 81 MG tablet Take 1 tablet (81 mg total) by mouth daily.  90 tablet  3  . atenolol (TENORMIN) 50 MG tablet Take 50 mg by mouth daily.        . Calcium Carbonate-Vitamin D (CALTRATE 600+D) 600-400 MG-UNIT per tablet Take 2 tablets by mouth daily.       . carboxymethylcellulose (REFRESH) 1 % ophthalmic solution Place 1 drop into both eyes as needed.       . fexofenadine (ALLEGRA) 180 MG tablet Take 180 mg by mouth daily.        Marland Kitchen glipiZIDE (GLUCOTROL XL) 2.5 MG 24 hr tablet Take 2.5 mg by mouth daily.      . hydrochlorothiazide (HYDRODIURIL) 25 MG tablet Take 25 mg by mouth daily.        Marland Kitchen losartan (COZAAR) 100 MG tablet Take 100 mg by mouth daily.      . metFORMIN (GLUCOPHAGE-XR) 500 MG 24 hr tablet Take 500 mg by mouth daily with breakfast.        . mometasone (NASONEX) 50 MCG/ACT nasal spray Place 2 sprays into the nose daily.         . Multiple Vitamin (MULTIVITAMIN) tablet Take 1 tablet by mouth daily.        . nitroGLYCERIN (NITROSTAT) 0.4 MG SL tablet Place 1 tablet (0.4 mg total) under the tongue every 5 (five) minutes as needed for chest pain.  100 tablet  3  . omeprazole (PRILOSEC) 20 MG capsule Take 20 mg by mouth daily.        . simvastatin (ZOCOR) 20 MG tablet Take 20 mg by mouth at bedtime.        Marland Kitchen spironolactone (ALDACTONE) 25 MG tablet Take 1 tablet (25 mg total) by mouth daily.  30 tablet  6  . ursodiol (ACTIGALL) 300 MG capsule Take 300 mg by mouth 2 (two) times daily.        . vitamin E 400 UNIT capsule Take 400 Units by mouth 2 (two) times daily.        Marland Kitchen warfarin (JANTOVEN) 5 MG tablet Take 1 tablet (5 mg total) by mouth daily.  90 tablet  3   No current facility-administered medications for this visit.    Past Medical History  Diagnosis Date  . Hypertension   . Diabetes mellitus   . Nonalcoholic  hepatosteatosis   . GERD (gastroesophageal reflux disease)   . Allergic rhinitis   . Hyperlipidemia   . Colonic polyp     Past Surgical History  Procedure Laterality Date  . Appendectomy    . Cardiac catheterization  2001    Nonobstructive CAD  . Abdominal hysterectomy      ROS:  As stated in the HPI and negative for all other systems.  PHYSICAL EXAM BP 121/75  Pulse 67  Ht 5' 4"  (1.626 m)  Wt 176 lb 12.8 oz (80.196 kg)  BMI 30.33 kg/m2 GENERAL:  Well appearing HEENT:  Pupils equal round and reactive, fundi not visualized, oral mucosa unremarkable NECK:  No jugular venous distention, waveform within normal limits, carotid upstroke brisk and symmetric, no bruits, no thyromegaly LUNGS:  Clear to auscultation bilaterally CHEST:  Unremarkable HEART:  PMI not displaced or sustained,S1 and S2 within normal limits, no S3, no S4, no clicks, no rubs, no murmurs ABD:  Flat, positive bowel sounds normal in frequency in pitch, no bruits, no rebound, no guarding, no midline pulsatile mass, no  hepatomegaly, no splenomegaly EXT:  2 plus pulses throughout, no edema, no cyanosis no clubbing   ASSESSMENT AND PLAN  TIA (transient ischemic attack) -  She has had no recurrence. She'll remain on anticoagulation as below.  PFO (patent foramen ovale) -  Identified by saline contrast study on a limited echo. There was right-to-left shunting. 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  However, she's had no symptoms. No imaging is indicated at this point.   CORONARY ARTERY DISEASE -  She has had nonobstructive disease in the past with a negative stress perfusion study in September of 2012.  Her arm pain pattern is chronic. She will continue the meds as listed.  HYPERTENSION Her blood pressure is at target.  She will continue the meds as listed. She is followup of her potassium and renal function since starting spironolactone.  Encounter for long-term (current) use of anticoagulants -  Continue lifelong Coumadin therapy unless any contraindications given her recent TIA associated with a PFO. I reviewed previous documentation and agree that she also should continue the aspirin.

## 2012-10-01 ENCOUNTER — Ambulatory Visit (INDEPENDENT_AMBULATORY_CARE_PROVIDER_SITE_OTHER): Payer: Medicare Other | Admitting: *Deleted

## 2012-10-01 DIAGNOSIS — Z7901 Long term (current) use of anticoagulants: Secondary | ICD-10-CM

## 2012-10-01 DIAGNOSIS — G459 Transient cerebral ischemic attack, unspecified: Secondary | ICD-10-CM

## 2012-10-01 DIAGNOSIS — Q211 Atrial septal defect: Secondary | ICD-10-CM

## 2012-10-01 LAB — POCT INR: INR: 2.4

## 2012-11-12 ENCOUNTER — Ambulatory Visit (INDEPENDENT_AMBULATORY_CARE_PROVIDER_SITE_OTHER): Payer: Medicare Other | Admitting: *Deleted

## 2012-11-12 DIAGNOSIS — G459 Transient cerebral ischemic attack, unspecified: Secondary | ICD-10-CM

## 2012-11-12 DIAGNOSIS — Q211 Atrial septal defect: Secondary | ICD-10-CM

## 2012-11-12 DIAGNOSIS — Z7901 Long term (current) use of anticoagulants: Secondary | ICD-10-CM

## 2012-11-12 LAB — POCT INR: INR: 3.3

## 2012-12-02 ENCOUNTER — Encounter: Payer: Self-pay | Admitting: Cardiovascular Disease

## 2012-12-10 ENCOUNTER — Ambulatory Visit (INDEPENDENT_AMBULATORY_CARE_PROVIDER_SITE_OTHER): Payer: Medicare Other | Admitting: *Deleted

## 2012-12-10 DIAGNOSIS — G459 Transient cerebral ischemic attack, unspecified: Secondary | ICD-10-CM

## 2012-12-10 DIAGNOSIS — Q211 Atrial septal defect: Secondary | ICD-10-CM

## 2012-12-10 DIAGNOSIS — Z7901 Long term (current) use of anticoagulants: Secondary | ICD-10-CM

## 2013-01-07 ENCOUNTER — Ambulatory Visit (INDEPENDENT_AMBULATORY_CARE_PROVIDER_SITE_OTHER): Payer: Medicare Other | Admitting: *Deleted

## 2013-01-07 DIAGNOSIS — Z7901 Long term (current) use of anticoagulants: Secondary | ICD-10-CM

## 2013-01-07 DIAGNOSIS — Q211 Atrial septal defect: Secondary | ICD-10-CM

## 2013-01-07 DIAGNOSIS — G459 Transient cerebral ischemic attack, unspecified: Secondary | ICD-10-CM

## 2013-02-07 ENCOUNTER — Encounter: Payer: Self-pay | Admitting: Cardiovascular Disease

## 2013-02-07 ENCOUNTER — Ambulatory Visit (INDEPENDENT_AMBULATORY_CARE_PROVIDER_SITE_OTHER): Payer: Medicare Other | Admitting: *Deleted

## 2013-02-07 ENCOUNTER — Ambulatory Visit (INDEPENDENT_AMBULATORY_CARE_PROVIDER_SITE_OTHER): Payer: Medicare Other | Admitting: Cardiovascular Disease

## 2013-02-07 VITALS — BP 145/81 | HR 69 | Ht 64.0 in | Wt 180.4 lb

## 2013-02-07 DIAGNOSIS — Q211 Atrial septal defect: Secondary | ICD-10-CM

## 2013-02-07 DIAGNOSIS — I10 Essential (primary) hypertension: Secondary | ICD-10-CM

## 2013-02-07 DIAGNOSIS — I2510 Atherosclerotic heart disease of native coronary artery without angina pectoris: Secondary | ICD-10-CM

## 2013-02-07 DIAGNOSIS — G459 Transient cerebral ischemic attack, unspecified: Secondary | ICD-10-CM

## 2013-02-07 DIAGNOSIS — Z7901 Long term (current) use of anticoagulants: Secondary | ICD-10-CM

## 2013-02-07 NOTE — Progress Notes (Signed)
Patient ID: [name], female   DOB: [birth year], 79 y.o.   MRN: [id number]   SUBJECTIVE: [name] presents for followup of a history of TIA, nonobstructive coronary disease and a PFO.   She has had no chest pressure. She walks daily without limiting symptoms. She's not having any PND or orthopnea. She's not had any palpitations, presyncope or syncope. She does have arm discomfort which she believes may be due to arthritis. However, this has been a stable pattern for some time and unchanged since her stress test previously completed.   She also has diabetes.  She'll take SL nitro with the arm pain and the pain will go away quickly. She doesn't describe associated symptoms. She's not had any new neurologic complaints such as visual or speech disturbances she has no motor deficit.   2 weeks ago, she lost her balance and fell while doing yardwork and bumped her forehead. This happened when she was bending over to pick something up. She denies antecedent headache or dizziness. She's been told she has inner ear problems. These balance problems predate her TIA.  She checks her BP at home and her SBP runs between 120-130 mmHg range, 130/81 she says.  She has some difficulty with her speech at times. She sometimes has mild swelling in her legs and feet, but stopped HCTZ (PCP d/c it) as she was up much of the night urinating. She says the swelling isn't bothersome. She had previously been on it 5-6 years.  She is a retired Public relations account executive (Educational psychologist), and taught for 40 years.     Allergies  Allergen Reactions  . Ivp Dye [Iodinated Diagnostic Agents]   . Penicillins     REACTION: unknown reaction  . Sulfonamide Derivatives     REACTION: unknown reaction    Current Outpatient Prescriptions  Medication Sig Dispense Refill  . aspirin EC 81 MG tablet Take 1 tablet (81 mg total) by mouth daily.  90 tablet  3  . atenolol (TENORMIN) 50 MG tablet Take 50 mg by mouth daily.        .  Calcium Carbonate-Vitamin D (CALTRATE 600+D) 600-400 MG-UNIT per tablet Take 2 tablets by mouth daily.       . carboxymethylcellulose (REFRESH) 1 % ophthalmic solution Place 1 drop into both eyes as needed.       . fexofenadine (ALLEGRA) 180 MG tablet Take 180 mg by mouth daily.        Marland Kitchen glipiZIDE (GLUCOTROL XL) 2.5 MG 24 hr tablet Take 2.5 mg by mouth daily.      Marland Kitchen losartan (COZAAR) 100 MG tablet Take 100 mg by mouth daily.      . metFORMIN (GLUCOPHAGE-XR) 500 MG 24 hr tablet Take 500 mg by mouth daily with breakfast.        . mometasone (NASONEX) 50 MCG/ACT nasal spray Place 2 sprays into the nose daily.        . Multiple Vitamin (MULTIVITAMIN) tablet Take 1 tablet by mouth daily.        . nitroGLYCERIN (NITROSTAT) 0.4 MG SL tablet Place 1 tablet (0.4 mg total) under the tongue every 5 (five) minutes as needed for chest pain.  100 tablet  3  . omeprazole (PRILOSEC) 20 MG capsule Take 20 mg by mouth daily.        . simvastatin (ZOCOR) 20 MG tablet Take 20 mg by mouth at bedtime.        . ursodiol (ACTIGALL) 300 MG capsule Take 300  mg by mouth 2 (two) times daily.        . vitamin E 400 UNIT capsule Take 400 Units by mouth 2 (two) times daily.        Marland Kitchen warfarin (JANTOVEN) 5 MG tablet Take 1 tablet (5 mg total) by mouth daily.  90 tablet  3   No current facility-administered medications for this visit.    Past Medical History  Diagnosis Date  . Hypertension   . Diabetes mellitus   . Nonalcoholic hepatosteatosis   . GERD (gastroesophageal reflux disease)   . Allergic rhinitis   . Hyperlipidemia   . Colonic polyp     Past Surgical History  Procedure Laterality Date  . Appendectomy    . Cardiac catheterization  2001    Nonobstructive CAD  . Abdominal hysterectomy      History   Social History  . Marital Status: Married    Spouse Name: N/A    Number of Children: N/A  . Years of Education: N/A   Occupational History  . Not on file.   Social History Main Topics  . Smoking  status: Former Smoker -- 2.00 packs/day for 40 years    Types: Cigarettes    Quit date: 06/05/1992  . Smokeless tobacco: Former Systems developer    Types: [place] date: 12/03/1992     Comment: used tobacco to help quit smoking  . Alcohol Use: Not on file  . Drug Use: Not on file  . Sexual Activity: Not on file   Other Topics Concern  . Not on file   Social History Narrative  . No narrative on file    BP: 145/81 Pulse: 69  PHYSICAL EXAM General: NAD Neck: No JVD, no thyromegaly or thyroid nodule.  Lungs: Clear to auscultation bilaterally with normal respiratory effort. CV: Nondisplaced PMI.  Heart regular S1/S2, no S3/S4, no murmur.  No peripheral edema.  No carotid bruit.  Normal pedal pulses.  Abdomen: Soft, nontender, no hepatosplenomegaly, no distention.  Neurologic: Alert and oriented x 3.  Psych: Normal affect. Extremities: No clubbing or cyanosis.   ECG: reviewed and available in electronic records. (NSR, 69 bpm)      ASSESSMENT AND PLAN: TIA (transient ischemic attack) -  She has had no recurrence. She'll remain on anticoagulation as below.   PFO (patent foramen ovale) -  Identified by saline contrast study on a limited echo. There was right-to-left shunting. 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 However, she's had no symptoms. No imaging is indicated at this point.   CORONARY ARTERY DISEASE -  She has had nonobstructive disease in the past with a negative stress perfusion study in September of 2012. Her arm pain pattern is chronic. She will continue the meds as listed.   HYPERTENSION  Her blood pressure is at target (reportedly at home, slightly elevated in office). She will continue the meds as listed.   Encounter for long-term (current) use of anticoagulants -  Continue lifelong Coumadin therapy unless any contraindications given her h/o TIA associated with a PFO. She should continue the aspirin as well.     Kate Sable, M.D.,  F.A.C.C.

## 2013-02-07 NOTE — Patient Instructions (Signed)
Continue all current medications. Your physician wants you to follow up in:  1 year.  You will receive a reminder letter in the mail one-two months in advance.  If you don't receive a letter, please call our office to schedule the follow up appointment   

## 2013-03-21 ENCOUNTER — Ambulatory Visit (INDEPENDENT_AMBULATORY_CARE_PROVIDER_SITE_OTHER): Payer: Medicare Other | Admitting: *Deleted

## 2013-03-21 DIAGNOSIS — Q211 Atrial septal defect: Secondary | ICD-10-CM

## 2013-03-21 DIAGNOSIS — Z7901 Long term (current) use of anticoagulants: Secondary | ICD-10-CM

## 2013-03-21 DIAGNOSIS — G459 Transient cerebral ischemic attack, unspecified: Secondary | ICD-10-CM

## 2013-03-21 LAB — POCT INR: INR: 2.2

## 2013-04-02 ENCOUNTER — Encounter: Payer: Self-pay | Admitting: Cardiovascular Disease

## 2013-04-16 ENCOUNTER — Encounter: Payer: Self-pay | Admitting: Cardiovascular Disease

## 2013-04-16 ENCOUNTER — Ambulatory Visit (INDEPENDENT_AMBULATORY_CARE_PROVIDER_SITE_OTHER): Payer: Medicare Other | Admitting: Cardiovascular Disease

## 2013-04-16 VITALS — BP 146/79 | HR 75 | Ht 64.0 in | Wt 177.0 lb

## 2013-04-16 DIAGNOSIS — Z7901 Long term (current) use of anticoagulants: Secondary | ICD-10-CM

## 2013-04-16 DIAGNOSIS — I10 Essential (primary) hypertension: Secondary | ICD-10-CM

## 2013-04-16 DIAGNOSIS — G459 Transient cerebral ischemic attack, unspecified: Secondary | ICD-10-CM

## 2013-04-16 DIAGNOSIS — E7800 Pure hypercholesterolemia, unspecified: Secondary | ICD-10-CM

## 2013-04-16 DIAGNOSIS — I2510 Atherosclerotic heart disease of native coronary artery without angina pectoris: Secondary | ICD-10-CM

## 2013-04-16 DIAGNOSIS — Q211 Atrial septal defect: Secondary | ICD-10-CM

## 2013-04-16 MED ORDER — ATENOLOL 50 MG PO TABS: 75.0000 mg | ORAL_TABLET | Freq: Every day | ORAL | Status: DC

## 2013-04-16 NOTE — Addendum Note (Signed)
Addended by: Laurine Blazer on: 04/16/2013 10:57 AM   Modules accepted: Orders

## 2013-04-16 NOTE — Patient Instructions (Signed)
   Increase Atenolol to 81m daily - written script given Continue all other medications.   Nurse visit for blood pressure & heart rate check in 2 weeks  Follow up in  3 months

## 2013-04-16 NOTE — Progress Notes (Signed)
Patient ID: [name], female   DOB: [birth year], 79 y.o.   MRN: [id number]      SUBJECTIVE: [name] is an 79 yr old woman with a h/o recurrent TIA's and a PFO, diabetes, HTN, hyperlipidemia, nonobstructive CAD and is on lifelong warfarin therapy, who presents for post-hospital follow-up. She was hospitalized in late October at [facility] for a TIA. Her BP was 180/110 mmHg on admission. An echocardiogram revealed normal LV systolic function (EF 42-35%), grade I diastolic dysfunction,  mild to moderate LAE, mild to moderate mitral regurgitation, as well as moderate AV sclerosis without stenosis. The RV was noted to be poorly visualized thus there were no comments made on regional wall motion. However, the cavity size and systolic function were reported as normal. Carotid Dopplers revealed 36-14% LICA stenosis with no focal RICA stenosis. Her ECG showed NSR with a PVC, 74 bpm. Renal function was normal (BUN 12, creatinine 0.71).  She is feeling much better. She had some wax removed from her right ear and no longer has a sense of fullness or dizziness. She very seldom gets fleeting chest pains, but does carry her SL nitro with her. She denies shortness of breath as well.  She thinks her BP cuff is too large and is probably inaccurate.  Allergies  Allergen Reactions  . Ivp Dye [Iodinated Diagnostic Agents]   . Penicillins     REACTION: unknown reaction  . Sulfonamide Derivatives     REACTION: unknown reaction    Current Outpatient Prescriptions  Medication Sig Dispense Refill  . aspirin EC 81 MG tablet Take 1 tablet (81 mg total) by mouth daily.  90 tablet  3  . atenolol (TENORMIN) 50 MG tablet Take 50 mg by mouth daily.        . Calcium Carbonate-Vitamin D (CALTRATE 600+D) 600-400 MG-UNIT per tablet Take 1 tablet by mouth daily.       . carboxymethylcellulose (REFRESH) 1 % ophthalmic solution Place 1 drop into both eyes as needed.       . docusate sodium (COLACE) 100 MG capsule Take 100  mg by mouth 2 (two) times daily.      . fexofenadine (ALLEGRA) 180 MG tablet Take 180 mg by mouth daily.        Marland Kitchen glipiZIDE (GLUCOTROL XL) 2.5 MG 24 hr tablet Take 2.5 mg by mouth daily.      Marland Kitchen losartan (COZAAR) 100 MG tablet Take 100 mg by mouth daily.      . metFORMIN (GLUCOPHAGE-XR) 500 MG 24 hr tablet Take 500 mg by mouth daily with breakfast.        . mometasone (NASONEX) 50 MCG/ACT nasal spray Place 2 sprays into the nose daily.        . Multiple Vitamin (MULTIVITAMIN) tablet Take 1 tablet by mouth daily.        . nitroGLYCERIN (NITROSTAT) 0.4 MG SL tablet Place 0.4 mg under the tongue every 5 (five) minutes as needed for chest pain.      Marland Kitchen omeprazole (PRILOSEC) 20 MG capsule Take 20 mg by mouth daily.        . simvastatin (ZOCOR) 20 MG tablet Take 20 mg by mouth at bedtime.        . ursodiol (ACTIGALL) 300 MG capsule Take 300 mg by mouth 2 (two) times daily.        . vitamin E 400 UNIT capsule Take 400 Units by mouth 2 (two) times daily.        Marland Kitchen  warfarin (JANTOVEN) 5 MG tablet Take 1 tablet (5 mg total) by mouth daily.  90 tablet  3   No current facility-administered medications for this visit.    Past Medical History  Diagnosis Date  . Hypertension   . Diabetes mellitus   . Nonalcoholic hepatosteatosis   . GERD (gastroesophageal reflux disease)   . Allergic rhinitis   . Hyperlipidemia   . Colonic polyp     Past Surgical History  Procedure Laterality Date  . Appendectomy    . Cardiac catheterization  2001    Nonobstructive CAD  . Abdominal hysterectomy      History   Social History  . Marital Status: Married    Spouse Name: N/A    Number of Children: N/A  . Years of Education: N/A   Occupational History  . Not on file.   Social History Main Topics  . Smoking status: Former Smoker -- 2.00 packs/day for 40 years    Types: Cigarettes    Quit date: 06/05/1992  . Smokeless tobacco: Former Systems developer    Types: [employer] date: 12/03/1992     Comment: used tobacco  to help quit smoking  . Alcohol Use: Not on file  . Drug Use: Not on file  . Sexual Activity: Not on file   Other Topics Concern  . Not on file   Social History Narrative  . No narrative on file     Filed Vitals:   04/16/13 0946  BP: 146/79  Pulse: 75  Height: 5' 4"  (1.626 m)  Weight: 177 lb (80.287 kg)    PHYSICAL EXAM General: NAD Neck: No JVD, no thyromegaly or thyroid nodule.  Lungs: Clear to auscultation bilaterally with normal respiratory effort. CV: Nondisplaced PMI.  Heart regular S1/S2, no S3/S4, no murmur.  No peripheral edema.  Left carotid bruit.  Normal pedal pulses.  Abdomen: Soft, nontender, no hepatosplenomegaly, no distention.  Neurologic: Alert and oriented x 3.  Psych: Normal affect. Extremities: No clubbing or cyanosis.   ECG: reviewed and available in electronic records.      ASSESSMENT AND PLAN: TIA (transient ischemic attack) -  She has had a recurrence, and optimal BP control is of supreme importance. She'll remain on anticoagulation as below. I will increase her atenolol to 75 mg daily and have her return for a nurse visit in 2 weeks to assess both BP and HR.  PFO (patent foramen ovale) -  Previously identified by saline contrast study on a limited echo. There was right-to-left shunting and also some evidence of a right ventricular aneurysm. Her most recent echo did not comment on this, but did mention normal size and function.   CORONARY ARTERY DISEASE -  She has had nonobstructive disease in the past with a negative stress perfusion study in September of 2012. She will continue the meds as listed.   HYPERTENSION  Her blood pressure is not at target, given her recurrent TIA's. I will increase her atenolol to 75 mg daily and have her return for a nurse visit in 2 weeks to assess both BP and HR. If her HR is low or she begins to feel lightheaded or dizzy due to bradycardia, I would reduce the atenolol dose and start amlodipine.  Encounter for  long-term (current) use of anticoagulants -  Continue lifelong Coumadin therapy unless any contraindications given her recent TIA associated with a PFO. I reviewed previous documentation and agree that she also should continue the aspirin.  A significant amount  of time was spent reviewing all hospital records. Time spent: 45 minutes.  Kate Sable, M.D., F.A.C.C.

## 2013-04-30 ENCOUNTER — Ambulatory Visit (INDEPENDENT_AMBULATORY_CARE_PROVIDER_SITE_OTHER): Payer: Medicare Other | Admitting: Cardiology

## 2013-04-30 ENCOUNTER — Ambulatory Visit (INDEPENDENT_AMBULATORY_CARE_PROVIDER_SITE_OTHER): Payer: Medicare Other | Admitting: *Deleted

## 2013-04-30 VITALS — BP 129/70 | HR 65 | Ht 64.0 in | Wt 177.0 lb

## 2013-04-30 DIAGNOSIS — I10 Essential (primary) hypertension: Secondary | ICD-10-CM

## 2013-04-30 DIAGNOSIS — Z7901 Long term (current) use of anticoagulants: Secondary | ICD-10-CM

## 2013-04-30 DIAGNOSIS — G459 Transient cerebral ischemic attack, unspecified: Secondary | ICD-10-CM

## 2013-04-30 DIAGNOSIS — Q211 Atrial septal defect: Secondary | ICD-10-CM

## 2013-04-30 LAB — POCT INR: INR: 2

## 2013-04-30 NOTE — Progress Notes (Signed)
Patient complains of  no chest pain or shortness of breath. Patient does complain of dizziness while walking, and light headed when going from a sitting position to a standing position.

## 2013-05-05 ENCOUNTER — Telehealth: Payer: Self-pay | Admitting: Cardiology

## 2013-05-05 NOTE — Telephone Encounter (Signed)
Called left pt a message to return call.

## 2013-05-05 NOTE — Telephone Encounter (Signed)
Pt informed and pt verbalized understanding.

## 2013-05-05 NOTE — Telephone Encounter (Signed)
Message copied by Lewayne Bunting on Mon May 05, 2013 10:43 AM ------      Message from: Kate Sable A      Created: Wed Apr 30, 2013  7:44 PM       No changes in Rx for now.      Must move slowly when going from sitting to standing position, due to orthostasis.            ----- Message -----         From: Lewayne Bunting, CMA         Sent: 04/30/2013   9:03 AM           To: Herminio Commons, MD                   ------

## 2013-06-04 ENCOUNTER — Encounter: Payer: Self-pay | Admitting: Cardiovascular Disease

## 2013-06-10 ENCOUNTER — Ambulatory Visit (INDEPENDENT_AMBULATORY_CARE_PROVIDER_SITE_OTHER): Payer: Medicare Other | Admitting: *Deleted

## 2013-06-10 DIAGNOSIS — Z7901 Long term (current) use of anticoagulants: Secondary | ICD-10-CM

## 2013-06-10 DIAGNOSIS — Q2112 Patent foramen ovale: Secondary | ICD-10-CM

## 2013-06-10 DIAGNOSIS — Q211 Atrial septal defect: Secondary | ICD-10-CM

## 2013-06-10 DIAGNOSIS — G459 Transient cerebral ischemic attack, unspecified: Secondary | ICD-10-CM

## 2013-06-10 DIAGNOSIS — Q2111 Secundum atrial septal defect: Secondary | ICD-10-CM

## 2013-06-10 LAB — POCT INR: INR: 4.6

## 2013-06-24 ENCOUNTER — Ambulatory Visit (INDEPENDENT_AMBULATORY_CARE_PROVIDER_SITE_OTHER): Payer: Medicare Other | Admitting: *Deleted

## 2013-06-24 DIAGNOSIS — Q211 Atrial septal defect: Secondary | ICD-10-CM

## 2013-06-24 DIAGNOSIS — G459 Transient cerebral ischemic attack, unspecified: Secondary | ICD-10-CM

## 2013-06-24 DIAGNOSIS — Q2112 Patent foramen ovale: Secondary | ICD-10-CM

## 2013-06-24 DIAGNOSIS — Z7901 Long term (current) use of anticoagulants: Secondary | ICD-10-CM

## 2013-06-24 DIAGNOSIS — Q2111 Secundum atrial septal defect: Secondary | ICD-10-CM

## 2013-06-24 LAB — POCT INR: INR: 2.8

## 2013-07-17 ENCOUNTER — Ambulatory Visit (INDEPENDENT_AMBULATORY_CARE_PROVIDER_SITE_OTHER): Payer: Medicare Other | Admitting: Cardiovascular Disease

## 2013-07-17 ENCOUNTER — Ambulatory Visit (INDEPENDENT_AMBULATORY_CARE_PROVIDER_SITE_OTHER): Payer: Medicare Other | Admitting: *Deleted

## 2013-07-17 ENCOUNTER — Encounter: Payer: Self-pay | Admitting: Cardiovascular Disease

## 2013-07-17 VITALS — BP 158/79 | HR 72 | Ht 64.0 in | Wt 172.0 lb

## 2013-07-17 DIAGNOSIS — Q2112 Patent foramen ovale: Secondary | ICD-10-CM

## 2013-07-17 DIAGNOSIS — Z7901 Long term (current) use of anticoagulants: Secondary | ICD-10-CM

## 2013-07-17 DIAGNOSIS — Q2111 Secundum atrial septal defect: Secondary | ICD-10-CM

## 2013-07-17 DIAGNOSIS — G459 Transient cerebral ischemic attack, unspecified: Secondary | ICD-10-CM

## 2013-07-17 DIAGNOSIS — I10 Essential (primary) hypertension: Secondary | ICD-10-CM

## 2013-07-17 DIAGNOSIS — Q211 Atrial septal defect: Secondary | ICD-10-CM

## 2013-07-17 DIAGNOSIS — I2510 Atherosclerotic heart disease of native coronary artery without angina pectoris: Secondary | ICD-10-CM

## 2013-07-17 LAB — POCT INR: INR: 2.2

## 2013-07-17 MED ORDER — ATENOLOL 50 MG PO TABS: 50.0000 mg | ORAL_TABLET | Freq: Every day | ORAL | Status: DC

## 2013-07-17 MED ORDER — AMLODIPINE BESYLATE 5 MG PO TABS: 5.0000 mg | ORAL_TABLET | Freq: Every day | ORAL | Status: DC

## 2013-07-17 NOTE — Progress Notes (Signed)
Patient ID: [name], female   DOB: 11-04-1927, 80 y.o.   MRN: [id number]      SUBJECTIVE: Mrs. Byrer is an 80 yr old woman with a h/o recurrent TIA's and a PFO, diabetes, HTN, hyperlipidemia, nonobstructive CAD and is on lifelong warfarin therapy. She occasionally gets lightheaded and dizzy but denies syncope. She very seldom has chest pain which he thinks is related to her arthritis. She seldom gets shortness of breath. [place] walk [place].     Allergies  Allergen Reactions  . Ivp Dye [Iodinated Diagnostic Agents]   . Penicillins     REACTION: unknown reaction  . Sulfonamide Derivatives     REACTION: unknown reaction    Current Outpatient Prescriptions  Medication Sig Dispense Refill  . aspirin EC 81 MG tablet Take 1 tablet (81 mg total) by mouth daily.  90 tablet  3  . atenolol (TENORMIN) 50 MG tablet Take 1.5 tablets (75 mg total) by mouth daily.  45 tablet  1  . Calcium Carbonate-Vitamin D (CALTRATE 600+D) 600-400 MG-UNIT per tablet Take 1 tablet by mouth daily.       . carboxymethylcellulose (REFRESH) 1 % ophthalmic solution Place 1 drop into both eyes as needed.       . docusate sodium (COLACE) 100 MG capsule Take 100 mg by mouth 2 (two) times daily.      . fexofenadine (ALLEGRA) 180 MG tablet Take 180 mg by mouth daily.        Marland Kitchen glipiZIDE (GLUCOTROL XL) 2.5 MG 24 hr tablet Take 2.5 mg by mouth daily.      Marland Kitchen losartan (COZAAR) 100 MG tablet Take 100 mg by mouth daily.      . metFORMIN (GLUCOPHAGE-XR) 500 MG 24 hr tablet Take 500 mg by mouth daily with breakfast.        . mometasone (NASONEX) 50 MCG/ACT nasal spray Place 2 sprays into the nose daily.        . Multiple Vitamin (MULTIVITAMIN) tablet Take 1 tablet by mouth daily.        . nitroGLYCERIN (NITROSTAT) 0.4 MG SL tablet Place 0.4 mg under the tongue every 5 (five) minutes as needed for chest pain.      Marland Kitchen omeprazole (PRILOSEC) 20 MG capsule Take 20 mg by mouth daily.        . simvastatin (ZOCOR)  20 MG tablet Take 20 mg by mouth at bedtime.        . ursodiol (ACTIGALL) 300 MG capsule Take 300 mg by mouth 2 (two) times daily.        . vitamin E 400 UNIT capsule Take 400 Units by mouth 2 (two) times daily.        Marland Kitchen warfarin (JANTOVEN) 5 MG tablet Take 1 tablet (5 mg total) by mouth daily.  90 tablet  3   No current facility-administered medications for this visit.    Past Medical History  Diagnosis Date  . Hypertension   . Diabetes mellitus   . Nonalcoholic hepatosteatosis   . GERD (gastroesophageal reflux disease)   . Allergic rhinitis   . Hyperlipidemia   . Colonic polyp     Past Surgical History  Procedure Laterality Date  . Appendectomy    . Cardiac catheterization  2001    Nonobstructive CAD  . Abdominal hysterectomy      History   Social History  . Marital Status: Married    Spouse Name: N/A    Number  of Children: N/A  . Years of Education: N/A   Occupational History  . Not on file.   Social History Main Topics  . Smoking status: Former Smoker -- 2.00 packs/day for 40 years    Types: Cigarettes    Quit date: 06/05/1992  . Smokeless tobacco: Former Systems developer    Types: [place] date: 12/03/1992     Comment: used tobacco to help quit smoking  . Alcohol Use: Not on file  . Drug Use: Not on file  . Sexual Activity: Not on file   Other Topics Concern  . Not on file   Social History Narrative  . No narrative on file     Filed Vitals:   07/17/13 1024  BP: 158/79  Pulse: 72  Height: 5' 4"  (1.626 m)  Weight: 172 lb (78.019 kg)  SpO2: 96%    PHYSICAL EXAM General: NAD Neck: No JVD, no thyromegaly or thyroid nodule.  Lungs: Clear to auscultation bilaterally with normal respiratory effort. CV: Nondisplaced PMI.  Heart regular S1/S2, no S3/S4, no murmur.  No peripheral edema.  No carotid bruit.  Normal pedal pulses.  Abdomen: Soft, nontender, no hepatosplenomegaly, no distention.  Neurologic: Alert and oriented x 3.  Psych: Normal  affect. Extremities: No clubbing or cyanosis.   ECG: reviewed and available in electronic records.      ASSESSMENT AND PLAN: TIA (transient ischemic attack) -  Optimal BP control is of supreme importance. She'll remain on anticoagulation as below. I will reduce atenolol to 50 mg daily and start amlodipine 5 mg daily.  PFO (patent foramen ovale) -  Previously identified by saline contrast study on a limited echo. There was right-to-left shunting and also some evidence of a right ventricular aneurysm. Her most recent echo did not comment on this, but did mention normal size and function.   CORONARY ARTERY DISEASE -  She has had nonobstructive disease in the past with a negative stress perfusion study in September of 2012. She will continue the meds as listed.   HYPERTENSION  Her blood pressure is not at target, given her recurrent TIA's. I will reduce atenolol to 50 mg daily and start amlodipine 5 mg daily.  Encounter for long-term (current) use of anticoagulants -  Continue lifelong Coumadin therapy unless any contraindications given her h/o recurrent TIA associated with a PFO. Continue ASA as well. Will have INR checked today.  Dispo: f/u 6 months.  Kate Sable, M.D., F.A.C.C.

## 2013-07-17 NOTE — Patient Instructions (Signed)
Your physician recommends that you schedule a follow-up appointment in: 6 months with Dr. Bronson Ing. You should receive a letter in the mail in 4 months. If you do not receive this letter by June 2015 call our office to schedule this appointment.   Your physician has recommended you make the following change in your medication:  Decrease Atenolol 50 MG take 1 tablet by mouth once daily Start Amlodipine (Norvasc) 5 MG take 1 tablet by mouth once daily  Continue all other medications the same.

## 2013-08-15 ENCOUNTER — Ambulatory Visit (INDEPENDENT_AMBULATORY_CARE_PROVIDER_SITE_OTHER): Payer: Medicare Other | Admitting: *Deleted

## 2013-08-15 DIAGNOSIS — Q2112 Patent foramen ovale: Secondary | ICD-10-CM

## 2013-08-15 DIAGNOSIS — Q211 Atrial septal defect: Secondary | ICD-10-CM

## 2013-08-15 DIAGNOSIS — Q2111 Secundum atrial septal defect: Secondary | ICD-10-CM

## 2013-08-15 DIAGNOSIS — Z7901 Long term (current) use of anticoagulants: Secondary | ICD-10-CM

## 2013-08-15 DIAGNOSIS — G459 Transient cerebral ischemic attack, unspecified: Secondary | ICD-10-CM

## 2013-08-15 LAB — POCT INR: INR: 2.8

## 2013-09-17 ENCOUNTER — Encounter: Payer: Self-pay | Admitting: Cardiovascular Disease

## 2013-09-26 ENCOUNTER — Ambulatory Visit (INDEPENDENT_AMBULATORY_CARE_PROVIDER_SITE_OTHER): Payer: Medicare Other | Admitting: *Deleted

## 2013-09-26 DIAGNOSIS — Q211 Atrial septal defect: Secondary | ICD-10-CM

## 2013-09-26 DIAGNOSIS — Q2111 Secundum atrial septal defect: Secondary | ICD-10-CM

## 2013-09-26 DIAGNOSIS — Q2112 Patent foramen ovale: Secondary | ICD-10-CM

## 2013-09-26 DIAGNOSIS — Z7901 Long term (current) use of anticoagulants: Secondary | ICD-10-CM

## 2013-09-26 DIAGNOSIS — G459 Transient cerebral ischemic attack, unspecified: Secondary | ICD-10-CM

## 2013-09-26 LAB — POCT INR: INR: 1.9

## 2013-10-17 ENCOUNTER — Ambulatory Visit (INDEPENDENT_AMBULATORY_CARE_PROVIDER_SITE_OTHER): Payer: Medicare Other | Admitting: *Deleted

## 2013-10-17 DIAGNOSIS — Z7901 Long term (current) use of anticoagulants: Secondary | ICD-10-CM

## 2013-10-17 DIAGNOSIS — Q2112 Patent foramen ovale: Secondary | ICD-10-CM

## 2013-10-17 DIAGNOSIS — Q211 Atrial septal defect: Secondary | ICD-10-CM

## 2013-10-17 DIAGNOSIS — Q2111 Secundum atrial septal defect: Secondary | ICD-10-CM

## 2013-10-17 DIAGNOSIS — G459 Transient cerebral ischemic attack, unspecified: Secondary | ICD-10-CM

## 2013-10-17 LAB — POCT INR: INR: 3.1

## 2013-11-14 ENCOUNTER — Ambulatory Visit (INDEPENDENT_AMBULATORY_CARE_PROVIDER_SITE_OTHER): Payer: Medicare Other | Admitting: *Deleted

## 2013-11-14 DIAGNOSIS — Q211 Atrial septal defect: Secondary | ICD-10-CM

## 2013-11-14 DIAGNOSIS — Q2111 Secundum atrial septal defect: Secondary | ICD-10-CM

## 2013-11-14 DIAGNOSIS — G459 Transient cerebral ischemic attack, unspecified: Secondary | ICD-10-CM

## 2013-11-14 DIAGNOSIS — Q2112 Patent foramen ovale: Secondary | ICD-10-CM

## 2013-11-14 DIAGNOSIS — Z7901 Long term (current) use of anticoagulants: Secondary | ICD-10-CM

## 2013-11-14 LAB — POCT INR: INR: 2.2

## 2013-12-12 ENCOUNTER — Ambulatory Visit (INDEPENDENT_AMBULATORY_CARE_PROVIDER_SITE_OTHER): Payer: Medicare Other | Admitting: *Deleted

## 2013-12-12 DIAGNOSIS — Q211 Atrial septal defect: Secondary | ICD-10-CM

## 2013-12-12 DIAGNOSIS — Q2111 Secundum atrial septal defect: Secondary | ICD-10-CM

## 2013-12-12 DIAGNOSIS — G459 Transient cerebral ischemic attack, unspecified: Secondary | ICD-10-CM

## 2013-12-12 DIAGNOSIS — Q2112 Patent foramen ovale: Secondary | ICD-10-CM

## 2013-12-12 DIAGNOSIS — Z7901 Long term (current) use of anticoagulants: Secondary | ICD-10-CM

## 2013-12-12 LAB — POCT INR: INR: 3.2

## 2014-01-09 ENCOUNTER — Ambulatory Visit (INDEPENDENT_AMBULATORY_CARE_PROVIDER_SITE_OTHER): Payer: Medicare Other | Admitting: *Deleted

## 2014-01-09 DIAGNOSIS — Q211 Atrial septal defect: Secondary | ICD-10-CM

## 2014-01-09 DIAGNOSIS — Z7901 Long term (current) use of anticoagulants: Secondary | ICD-10-CM

## 2014-01-09 DIAGNOSIS — Q2111 Secundum atrial septal defect: Secondary | ICD-10-CM

## 2014-01-09 DIAGNOSIS — Q2112 Patent foramen ovale: Secondary | ICD-10-CM

## 2014-01-09 DIAGNOSIS — G459 Transient cerebral ischemic attack, unspecified: Secondary | ICD-10-CM

## 2014-01-09 LAB — POCT INR: INR: 2.5

## 2014-01-20 ENCOUNTER — Ambulatory Visit: Payer: Medicare Other | Admitting: Cardiovascular Disease

## 2014-02-06 ENCOUNTER — Ambulatory Visit (INDEPENDENT_AMBULATORY_CARE_PROVIDER_SITE_OTHER): Payer: Medicare Other | Admitting: Pharmacist

## 2014-02-06 DIAGNOSIS — Q211 Atrial septal defect: Secondary | ICD-10-CM

## 2014-02-06 DIAGNOSIS — Q2112 Patent foramen ovale: Secondary | ICD-10-CM

## 2014-02-06 DIAGNOSIS — G459 Transient cerebral ischemic attack, unspecified: Secondary | ICD-10-CM

## 2014-02-06 DIAGNOSIS — Z7901 Long term (current) use of anticoagulants: Secondary | ICD-10-CM

## 2014-02-06 DIAGNOSIS — Q2111 Secundum atrial septal defect: Secondary | ICD-10-CM

## 2014-02-06 LAB — POCT INR: INR: 1.6

## 2014-02-16 ENCOUNTER — Telehealth: Payer: Self-pay | Admitting: *Deleted

## 2014-02-16 NOTE — Telephone Encounter (Signed)
Can pt hold coumadin 3 days for dental extraction?

## 2014-02-16 NOTE — Telephone Encounter (Signed)
Pt's dentist called asking how long patient would need to hold her coumadin prior to her dental procedure.

## 2014-02-17 NOTE — Telephone Encounter (Signed)
Info relayed.

## 2014-02-17 NOTE — Telephone Encounter (Signed)
That would be fine (3 days).

## 2014-02-27 ENCOUNTER — Ambulatory Visit (INDEPENDENT_AMBULATORY_CARE_PROVIDER_SITE_OTHER): Payer: Medicare Other | Admitting: *Deleted

## 2014-02-27 DIAGNOSIS — G459 Transient cerebral ischemic attack, unspecified: Secondary | ICD-10-CM

## 2014-02-27 DIAGNOSIS — Q2112 Patent foramen ovale: Secondary | ICD-10-CM

## 2014-02-27 DIAGNOSIS — Z7901 Long term (current) use of anticoagulants: Secondary | ICD-10-CM

## 2014-02-27 DIAGNOSIS — Q2111 Secundum atrial septal defect: Secondary | ICD-10-CM

## 2014-02-27 DIAGNOSIS — Q211 Atrial septal defect: Secondary | ICD-10-CM

## 2014-02-27 LAB — POCT INR: INR: 2.3

## 2014-03-06 ENCOUNTER — Ambulatory Visit (INDEPENDENT_AMBULATORY_CARE_PROVIDER_SITE_OTHER): Payer: Medicare Other | Admitting: Cardiovascular Disease

## 2014-03-06 ENCOUNTER — Encounter: Payer: Self-pay | Admitting: Cardiovascular Disease

## 2014-03-06 VITALS — BP 130/78 | HR 60 | Ht 64.0 in | Wt 176.5 lb

## 2014-03-06 DIAGNOSIS — E7800 Pure hypercholesterolemia, unspecified: Secondary | ICD-10-CM

## 2014-03-06 DIAGNOSIS — Q211 Atrial septal defect: Secondary | ICD-10-CM

## 2014-03-06 DIAGNOSIS — G459 Transient cerebral ischemic attack, unspecified: Secondary | ICD-10-CM

## 2014-03-06 DIAGNOSIS — R42 Dizziness and giddiness: Secondary | ICD-10-CM

## 2014-03-06 DIAGNOSIS — Q2112 Patent foramen ovale: Secondary | ICD-10-CM

## 2014-03-06 DIAGNOSIS — I10 Essential (primary) hypertension: Secondary | ICD-10-CM

## 2014-03-06 DIAGNOSIS — I2510 Atherosclerotic heart disease of native coronary artery without angina pectoris: Secondary | ICD-10-CM

## 2014-03-06 MED ORDER — ATENOLOL 25 MG PO TABS: 25.0000 mg | ORAL_TABLET | Freq: Every day | ORAL | Status: DC

## 2014-03-06 NOTE — Progress Notes (Signed)
Patient ID: [name], female   DOB: 08-17-1927, 80 y.o.   MRN: [id number]      SUBJECTIVE: Mrs. Simonian is an 80 yr old woman with a h/o recurrent TIA's and a PFO, diabetes, HTN, hyperlipidemia, nonobstructive CAD and is on lifelong warfarin therapy. ECG performed in the office today demonstrates normal sinus rhythm with an isolated PVC and a nonspecific T wave abnormality. She has been experiencing dizziness from time to time. She denies chest pain and shortness of breath as well as palpitations. She has had some arthritic pain of her left knee and of her cervical spine. She has had some minimal swelling of her ankles on occasion.   Review of Systems: As per "subjective", otherwise negative.  Allergies  Allergen Reactions  . Ivp Dye [Iodinated Diagnostic Agents]   . Penicillins     REACTION: unknown reaction  . Sulfonamide Derivatives     REACTION: unknown reaction    Current Outpatient Prescriptions  Medication Sig Dispense Refill  . amLODipine (NORVASC) 5 MG tablet Take 1 tablet (5 mg total) by mouth daily.  90 tablet  3  . aspirin EC 81 MG tablet Take 1 tablet (81 mg total) by mouth daily.  90 tablet  3  . atenolol (TENORMIN) 50 MG tablet Take 1 tablet (50 mg total) by mouth daily.  30 tablet  6  . Calcium Carbonate-Vitamin D (CALTRATE 600+D) 600-400 MG-UNIT per tablet Take 1 tablet by mouth daily.       . carboxymethylcellulose (REFRESH) 1 % ophthalmic solution Place 1 drop into both eyes as needed.       . docusate sodium (COLACE) 100 MG capsule Take 100 mg by mouth 2 (two) times daily.      . fexofenadine (ALLEGRA) 180 MG tablet Take 180 mg by mouth daily.        . fluticasone (FLONASE) 50 MCG/ACT nasal spray Place 1 spray into both nostrils daily.      Marland Kitchen glipiZIDE (GLUCOTROL XL) 2.5 MG 24 hr tablet Take 2.5 mg by mouth daily.      Marland Kitchen losartan (COZAAR) 100 MG tablet Take 100 mg by mouth daily.      . metFORMIN (GLUCOPHAGE-XR) 500 MG 24 hr tablet Take 500 mg by mouth daily with  breakfast.        . Multiple Vitamin (MULTIVITAMIN) tablet Take 1 tablet by mouth daily.        . nitroGLYCERIN (NITROSTAT) 0.4 MG SL tablet Place 0.4 mg under the tongue every 5 (five) minutes as needed for chest pain.      Marland Kitchen omeprazole (PRILOSEC) 20 MG capsule Take 20 mg by mouth daily.        . simvastatin (ZOCOR) 20 MG tablet Take 20 mg by mouth at bedtime.        Marland Kitchen warfarin (JANTOVEN) 5 MG tablet Take 1 tablet (5 mg total) by mouth daily.  90 tablet  3   No current facility-administered medications for this visit.    Past Medical History  Diagnosis Date  . Hypertension   . Diabetes mellitus   . Nonalcoholic hepatosteatosis   . GERD (gastroesophageal reflux disease)   . Allergic rhinitis   . Hyperlipidemia   . Colonic polyp     Past Surgical History  Procedure Laterality Date  . Appendectomy    . Cardiac catheterization  2001    Nonobstructive CAD  . Abdominal hysterectomy      History   Social History  . Marital Status: Married  Spouse Name: N/A    Number of Children: N/A  . Years of Education: N/A   Occupational History  . Not on file.   Social History Main Topics  . Smoking status: Former Smoker -- 2.00 packs/day for 40 years    Types: Cigarettes    Quit date: 06/05/1992  . Smokeless tobacco: Former Systems developer    Types: [place] date: 12/03/1992     Comment: used tobacco to help quit smoking  . Alcohol Use: No  . Drug Use: No  . Sexual Activity: Not on file   Other Topics Concern  . Not on file   Social History Narrative  . No narrative on file     Filed Vitals:   03/06/14 1303  Height: 5' 4"  (1.626 m)  Weight: 176 lb 8 oz (80.06 kg)   BP 130/78 Pulse 60  SpO2 95%    PHYSICAL EXAM General: NAD HEENT: Normal. Neck: No JVD, no thyromegaly. Lungs: Clear to auscultation bilaterally with normal respiratory effort. CV: Nondisplaced PMI.  Regular rate and rhythm, normal S1/S2, no S3/S4, no murmur. No pretibial or periankle edema.  No carotid  bruit.  Normal pedal pulses.  Abdomen: Soft, nontender, no hepatosplenomegaly, no distention.  Neurologic: Alert and oriented x 3.  Psych: Normal affect. Skin: Normal. Musculoskeletal: Normal range of motion, no gross deformities. Extremities: No clubbing or cyanosis.   ECG: Most recent ECG reviewed.      ASSESSMENT AND PLAN:  TIA (transient ischemic attack) -  Optimal BP control is of supreme importance. Continue warfarin for anticoagulation as below. INR 2.3 on 9/25.  PFO (patent foramen ovale) -  Previously identified by saline contrast study on a limited echo. There was right-to-left shunting and also some evidence of a right ventricular aneurysm. Her most recent echo did not comment on this, but did mention normal size and function.   CORONARY ARTERY DISEASE -  She has had nonobstructive disease in the past with a negative stress perfusion study in September of 2012. She will continue the meds as listed.   HYPERTENSION  Well controlled on current therapy.  Encounter for long-term (current) use of anticoagulants -  Continue lifelong Coumadin therapy unless any contraindications given her h/o recurrent TIA associated with a PFO. Continue ASA as well.   Dizziness Resting HR is 60 bpm. I will reduce atenolol to 25 mg daily to see if this will help alleviate some of her symptoms.  Dispo: f/u 1 year.  Kate Sable, M.D., F.A.C.C.

## 2014-03-06 NOTE — Patient Instructions (Signed)
   Decrease Atenolol to 54m daily - printed script given today Continue all other medications.   Your physician wants you to follow up in:  1 year.  You will receive a reminder letter in the mail one-two months in advance.  If you don't receive a letter, please call our office to schedule the follow up appointment

## 2014-03-26 ENCOUNTER — Ambulatory Visit (INDEPENDENT_AMBULATORY_CARE_PROVIDER_SITE_OTHER): Payer: Medicare Other | Admitting: *Deleted

## 2014-03-26 DIAGNOSIS — Q211 Atrial septal defect: Secondary | ICD-10-CM

## 2014-03-26 DIAGNOSIS — Z7901 Long term (current) use of anticoagulants: Secondary | ICD-10-CM

## 2014-03-26 DIAGNOSIS — Q2112 Patent foramen ovale: Secondary | ICD-10-CM

## 2014-03-26 DIAGNOSIS — G459 Transient cerebral ischemic attack, unspecified: Secondary | ICD-10-CM

## 2014-03-26 LAB — POCT INR: INR: 2.7

## 2014-04-14 ENCOUNTER — Ambulatory Visit: Payer: Medicare Other | Admitting: *Deleted

## 2014-04-14 ENCOUNTER — Ambulatory Visit (INDEPENDENT_AMBULATORY_CARE_PROVIDER_SITE_OTHER): Payer: Medicare Other | Admitting: *Deleted

## 2014-04-14 VITALS — BP 145/80 | HR 69

## 2014-04-14 DIAGNOSIS — G459 Transient cerebral ischemic attack, unspecified: Secondary | ICD-10-CM

## 2014-04-14 DIAGNOSIS — Q2112 Patent foramen ovale: Secondary | ICD-10-CM

## 2014-04-14 DIAGNOSIS — Q211 Atrial septal defect: Secondary | ICD-10-CM

## 2014-04-14 DIAGNOSIS — Z7901 Long term (current) use of anticoagulants: Secondary | ICD-10-CM

## 2014-04-14 DIAGNOSIS — I10 Essential (primary) hypertension: Secondary | ICD-10-CM

## 2014-04-14 LAB — POCT INR
INR: 2.3
INR: 2.6

## 2014-04-14 NOTE — Progress Notes (Signed)
Patient in office for nurse visit for BP check & monitor comparison. Recent decrease on her Atenolol at [facility] on 03/06/14.  Our machine:  145/80  69  96%  Her machine:  145/80  70.  Patient states that her readings at home are a little lower, but tends to get anxious when she comes to MD office.

## 2014-05-12 ENCOUNTER — Ambulatory Visit (INDEPENDENT_AMBULATORY_CARE_PROVIDER_SITE_OTHER): Payer: Medicare Other | Admitting: *Deleted

## 2014-05-12 DIAGNOSIS — G459 Transient cerebral ischemic attack, unspecified: Secondary | ICD-10-CM

## 2014-05-12 DIAGNOSIS — Q2112 Patent foramen ovale: Secondary | ICD-10-CM

## 2014-05-12 DIAGNOSIS — Q211 Atrial septal defect: Secondary | ICD-10-CM

## 2014-05-12 DIAGNOSIS — Z7901 Long term (current) use of anticoagulants: Secondary | ICD-10-CM

## 2014-05-12 LAB — POCT INR: INR: 2.3

## 2014-06-23 ENCOUNTER — Ambulatory Visit (INDEPENDENT_AMBULATORY_CARE_PROVIDER_SITE_OTHER): Payer: Medicare Other | Admitting: *Deleted

## 2014-06-23 DIAGNOSIS — Q211 Atrial septal defect: Secondary | ICD-10-CM

## 2014-06-23 DIAGNOSIS — Q2112 Patent foramen ovale: Secondary | ICD-10-CM

## 2014-06-23 DIAGNOSIS — Z7901 Long term (current) use of anticoagulants: Secondary | ICD-10-CM

## 2014-06-23 DIAGNOSIS — G459 Transient cerebral ischemic attack, unspecified: Secondary | ICD-10-CM

## 2014-06-23 LAB — POCT INR: INR: 2.2

## 2014-08-04 ENCOUNTER — Ambulatory Visit (INDEPENDENT_AMBULATORY_CARE_PROVIDER_SITE_OTHER): Payer: Medicare Other | Admitting: *Deleted

## 2014-08-04 DIAGNOSIS — Z7901 Long term (current) use of anticoagulants: Secondary | ICD-10-CM

## 2014-08-04 DIAGNOSIS — Q211 Atrial septal defect: Secondary | ICD-10-CM

## 2014-08-04 DIAGNOSIS — Q2112 Patent foramen ovale: Secondary | ICD-10-CM

## 2014-08-04 DIAGNOSIS — G459 Transient cerebral ischemic attack, unspecified: Secondary | ICD-10-CM

## 2014-08-04 LAB — POCT INR: INR: 2.3

## 2014-09-14 ENCOUNTER — Encounter: Payer: Self-pay | Admitting: Cardiovascular Disease

## 2014-09-15 ENCOUNTER — Ambulatory Visit (INDEPENDENT_AMBULATORY_CARE_PROVIDER_SITE_OTHER): Payer: Medicare Other | Admitting: *Deleted

## 2014-09-15 DIAGNOSIS — Q211 Atrial septal defect: Secondary | ICD-10-CM

## 2014-09-15 DIAGNOSIS — G459 Transient cerebral ischemic attack, unspecified: Secondary | ICD-10-CM

## 2014-09-15 DIAGNOSIS — Z7901 Long term (current) use of anticoagulants: Secondary | ICD-10-CM

## 2014-09-15 DIAGNOSIS — Q2112 Patent foramen ovale: Secondary | ICD-10-CM

## 2014-09-15 LAB — POCT INR: INR: 2.4

## 2014-09-16 ENCOUNTER — Other Ambulatory Visit: Payer: Self-pay | Admitting: Radiology

## 2014-09-16 DIAGNOSIS — I6523 Occlusion and stenosis of bilateral carotid arteries: Secondary | ICD-10-CM

## 2014-09-23 ENCOUNTER — Encounter (INDEPENDENT_AMBULATORY_CARE_PROVIDER_SITE_OTHER): Payer: Medicare Other

## 2014-09-23 DIAGNOSIS — I6523 Occlusion and stenosis of bilateral carotid arteries: Secondary | ICD-10-CM | POA: Diagnosis not present

## 2014-10-01 ENCOUNTER — Telehealth: Payer: Self-pay | Admitting: *Deleted

## 2014-10-01 NOTE — Telephone Encounter (Signed)
-----   Message from Herminio Commons, MD sent at 09/28/2014 10:05 AM EDT ----- Repeat in 6 months given significant unilateral disease.

## 2014-10-01 NOTE — Telephone Encounter (Signed)
Notes Recorded by Laurine Blazer, LPN on 9/37/9024 at 09:73 AM Patient notified and verbalized understanding.

## 2014-10-27 ENCOUNTER — Ambulatory Visit (INDEPENDENT_AMBULATORY_CARE_PROVIDER_SITE_OTHER): Payer: Medicare Other | Admitting: *Deleted

## 2014-10-27 DIAGNOSIS — Z7901 Long term (current) use of anticoagulants: Secondary | ICD-10-CM

## 2014-10-27 DIAGNOSIS — Q211 Atrial septal defect: Secondary | ICD-10-CM

## 2014-10-27 DIAGNOSIS — Q2112 Patent foramen ovale: Secondary | ICD-10-CM

## 2014-10-27 DIAGNOSIS — G459 Transient cerebral ischemic attack, unspecified: Secondary | ICD-10-CM | POA: Diagnosis not present

## 2014-10-27 LAB — POCT INR: INR: 2

## 2014-12-08 ENCOUNTER — Ambulatory Visit (INDEPENDENT_AMBULATORY_CARE_PROVIDER_SITE_OTHER): Payer: Medicare Other | Admitting: *Deleted

## 2014-12-08 DIAGNOSIS — Q211 Atrial septal defect: Secondary | ICD-10-CM | POA: Diagnosis not present

## 2014-12-08 DIAGNOSIS — Z7901 Long term (current) use of anticoagulants: Secondary | ICD-10-CM | POA: Diagnosis not present

## 2014-12-08 DIAGNOSIS — G459 Transient cerebral ischemic attack, unspecified: Secondary | ICD-10-CM | POA: Diagnosis not present

## 2014-12-08 DIAGNOSIS — Q2112 Patent foramen ovale: Secondary | ICD-10-CM

## 2014-12-08 LAB — POCT INR: INR: 1.9

## 2014-12-29 ENCOUNTER — Ambulatory Visit (INDEPENDENT_AMBULATORY_CARE_PROVIDER_SITE_OTHER): Payer: Medicare Other | Admitting: *Deleted

## 2014-12-29 DIAGNOSIS — G459 Transient cerebral ischemic attack, unspecified: Secondary | ICD-10-CM | POA: Diagnosis not present

## 2014-12-29 DIAGNOSIS — Q211 Atrial septal defect: Secondary | ICD-10-CM | POA: Diagnosis not present

## 2014-12-29 DIAGNOSIS — Z7901 Long term (current) use of anticoagulants: Secondary | ICD-10-CM

## 2014-12-29 DIAGNOSIS — Q2112 Patent foramen ovale: Secondary | ICD-10-CM

## 2014-12-29 LAB — POCT INR: INR: 2.1

## 2015-01-26 ENCOUNTER — Ambulatory Visit (INDEPENDENT_AMBULATORY_CARE_PROVIDER_SITE_OTHER): Payer: Medicare Other | Admitting: *Deleted

## 2015-01-26 DIAGNOSIS — Z7901 Long term (current) use of anticoagulants: Secondary | ICD-10-CM

## 2015-01-26 DIAGNOSIS — Q211 Atrial septal defect: Secondary | ICD-10-CM | POA: Diagnosis not present

## 2015-01-26 DIAGNOSIS — G459 Transient cerebral ischemic attack, unspecified: Secondary | ICD-10-CM

## 2015-01-26 DIAGNOSIS — Q2112 Patent foramen ovale: Secondary | ICD-10-CM

## 2015-01-26 LAB — POCT INR: INR: 2.3

## 2015-02-23 ENCOUNTER — Ambulatory Visit (INDEPENDENT_AMBULATORY_CARE_PROVIDER_SITE_OTHER): Payer: Medicare Other | Admitting: *Deleted

## 2015-02-23 DIAGNOSIS — Z7901 Long term (current) use of anticoagulants: Secondary | ICD-10-CM

## 2015-02-23 DIAGNOSIS — Q211 Atrial septal defect: Secondary | ICD-10-CM | POA: Diagnosis not present

## 2015-02-23 DIAGNOSIS — G459 Transient cerebral ischemic attack, unspecified: Secondary | ICD-10-CM

## 2015-02-23 DIAGNOSIS — Q2112 Patent foramen ovale: Secondary | ICD-10-CM

## 2015-02-23 LAB — POCT INR: INR: 2.5

## 2015-03-17 ENCOUNTER — Other Ambulatory Visit: Payer: Self-pay | Admitting: Cardiovascular Disease

## 2015-03-17 ENCOUNTER — Encounter: Payer: Self-pay | Admitting: Internal Medicine

## 2015-03-17 DIAGNOSIS — I6523 Occlusion and stenosis of bilateral carotid arteries: Secondary | ICD-10-CM

## 2015-03-19 ENCOUNTER — Encounter: Payer: Self-pay | Admitting: Cardiovascular Disease

## 2015-03-19 ENCOUNTER — Ambulatory Visit (INDEPENDENT_AMBULATORY_CARE_PROVIDER_SITE_OTHER): Payer: Medicare Other | Admitting: Cardiovascular Disease

## 2015-03-19 VITALS — BP 138/68 | HR 75 | Ht 64.0 in | Wt 174.0 lb

## 2015-03-19 DIAGNOSIS — G459 Transient cerebral ischemic attack, unspecified: Secondary | ICD-10-CM

## 2015-03-19 DIAGNOSIS — I10 Essential (primary) hypertension: Secondary | ICD-10-CM | POA: Diagnosis not present

## 2015-03-19 DIAGNOSIS — H938X3 Other specified disorders of ear, bilateral: Secondary | ICD-10-CM | POA: Diagnosis not present

## 2015-03-19 DIAGNOSIS — Q2112 Patent foramen ovale: Secondary | ICD-10-CM

## 2015-03-19 DIAGNOSIS — R42 Dizziness and giddiness: Secondary | ICD-10-CM

## 2015-03-19 DIAGNOSIS — E7800 Pure hypercholesterolemia, unspecified: Secondary | ICD-10-CM

## 2015-03-19 DIAGNOSIS — I2510 Atherosclerotic heart disease of native coronary artery without angina pectoris: Secondary | ICD-10-CM | POA: Diagnosis not present

## 2015-03-19 DIAGNOSIS — Q211 Atrial septal defect: Secondary | ICD-10-CM

## 2015-03-19 DIAGNOSIS — Z7901 Long term (current) use of anticoagulants: Secondary | ICD-10-CM

## 2015-03-19 DIAGNOSIS — I6523 Occlusion and stenosis of bilateral carotid arteries: Secondary | ICD-10-CM

## 2015-03-19 NOTE — Progress Notes (Signed)
Patient ID: PETE MERTEN, female   DOB: [birth year], 81 y.o.   MRN: [id number]      SUBJECTIVE: Mrs. Thorington is an 81 yr old woman with a h/o recurrent TIA's and a PFO, diabetes, essential HTN, hyperlipidemia, nonobstructive CAD and is on lifelong warfarin therapy. Carotid Dopplers on 09/24/14 demonstrated 60-79% left internal carotid artery stenosis and 139% right carotid artery stenosis.  She denies chest pain and shortness of breath. When I had reduced her atenolol dose at her last visit nearly 1 year ago, her dizziness improved for several months but recurred in the past 2-3 months. She complains of bilateral ear pressure and occasional temporal pain. She occasionally has feet swelling at the end of the day.  ECG performed in the office today demonstrates normal sinus rhythm with sinus arrhythmia.  Review of Systems: As per "subjective", otherwise negative.  Allergies  Allergen Reactions  . Ivp Dye [Iodinated Diagnostic Agents]   . Penicillins     REACTION: unknown reaction  . Sulfonamide Derivatives     REACTION: unknown reaction    Current Outpatient Prescriptions  Medication Sig Dispense Refill  . amLODipine (NORVASC) 5 MG tablet Take 1 tablet (5 mg total) by mouth daily. 90 tablet 3  . aspirin EC 81 MG tablet Take 1 tablet (81 mg total) by mouth daily. 90 tablet 3  . atenolol (TENORMIN) 25 MG tablet Take 1 tablet (25 mg total) by mouth daily. 90 tablet 3  . Calcium Carbonate-Vitamin D (CALTRATE 600+D) 600-400 MG-UNIT per tablet Take 1 tablet by mouth daily.     . carboxymethylcellulose (REFRESH) 1 % ophthalmic solution Place 1 drop into both eyes as needed.     . docusate sodium (COLACE) 100 MG capsule Take 100 mg by mouth 2 (two) times daily.    . fexofenadine (ALLEGRA) 180 MG tablet Take 180 mg by mouth daily.      . fluticasone (FLONASE) 50 MCG/ACT nasal spray Place 1 spray into both nostrils daily.    Marland Kitchen glipiZIDE (GLUCOTROL XL) 2.5 MG 24 hr tablet Take 2.5 mg by mouth daily.      Marland Kitchen losartan (COZAAR) 100 MG tablet Take 100 mg by mouth daily.    . metFORMIN (GLUCOPHAGE-XR) 500 MG 24 hr tablet Take 500 mg by mouth daily with breakfast.      . Multiple Vitamin (MULTIVITAMIN) tablet Take 1 tablet by mouth daily.      . nitroGLYCERIN (NITROSTAT) 0.4 MG SL tablet Place 0.4 mg under the tongue every 5 (five) minutes as needed for chest pain.    Marland Kitchen omeprazole (PRILOSEC) 20 MG capsule Take 20 mg by mouth daily.      . simvastatin (ZOCOR) 20 MG tablet Take 20 mg by mouth at bedtime.      . ursodiol (ACTIGALL) 300 MG capsule Take 300 mg by mouth 2 (two) times daily.     . vitamin E (VITAMIN E) 400 UNIT capsule Take 400 Units by mouth daily.     Marland Kitchen warfarin (JANTOVEN) 5 MG tablet Take 1 tablet (5 mg total) by mouth daily. 90 tablet 3   No current facility-administered medications for this visit.    Past Medical History  Diagnosis Date  . Hypertension   . Diabetes mellitus   . Nonalcoholic hepatosteatosis   . GERD (gastroesophageal reflux disease)   . Allergic rhinitis   . Hyperlipidemia   . Colonic polyp     Past Surgical History  Procedure Laterality Date  . Appendectomy    .  Cardiac catheterization  2001    Nonobstructive CAD  . Abdominal hysterectomy      Social History   Social History  . Marital Status: Married    Spouse Name: N/A  . Number of Children: N/A  . Years of Education: N/A   Occupational History  . Not on file.   Social History Main Topics  . Smoking status: Former Smoker -- 2.00 packs/day for 40 years    Types: Cigarettes    Quit date: 06/05/1992  . Smokeless tobacco: Former Systems developer    Types: [place] date: 12/03/1992     Comment: used tobacco to help quit smoking  . Alcohol Use: No  . Drug Use: No  . Sexual Activity: Not on file   Other Topics Concern  . Not on file   Social History Narrative     Filed Vitals:   03/19/15 1546  BP: 138/68  Pulse: 75  Height: 5' 4"  (1.626 m)  Weight: 174 lb (78.926 kg)  SpO2: 95%     PHYSICAL EXAM General: NAD HEENT: Normal. Neck: No JVD, no thyromegaly. Lungs: Clear to auscultation bilaterally with normal respiratory effort. CV: Nondisplaced PMI.  Regular rate and rhythm, normal S1/S2, no S3/S4, no murmur. No pretibial or periankle edema.  No carotid bruit.    Abdomen: Soft, nontender, no distention.  Neurologic: Alert and oriented x 3.  Psych: Normal affect. Skin: Normal. Musculoskeletal: No gross deformities. Extremities: No clubbing or cyanosis.   ECG: Most recent ECG reviewed.      ASSESSMENT AND PLAN: TIA (transient ischemic attack) -  Optimal BP control is of supreme importance. Continue warfarin for anticoagulation as below. INR 2.5 on 9/20.  PFO (patent foramen ovale) -  Previously identified by saline contrast study on a limited echo. There was right-to-left shunting and also some evidence of a right ventricular aneurysm. Her most recent echo did not comment on this, but did mention normal size and function.   CORONARY ARTERY DISEASE -  She has had nonobstructive disease in the past with a negative stress perfusion study in September of 2012. Presently asymptomatic.   ESSENTIAL HYPERTENSION  Well controlled on current therapy. Monitor given discontinuation of atenolol.  Encounter for long-term (current) use of anticoagulants -  Continue lifelong Coumadin therapy unless any contraindications given her h/o recurrent TIA associated with a PFO. Continue ASA as well.   Carotid artery disease Most recent results noted above with high-grade LICA stenosis. Dopplers to be repeated in early November.  Dizziness and ear fullness Possible due to inner ear problems. Will make referral to ENT. Given symptom improvement for several months with reduction in atenolol dose at last visit, will discontinue altogether.  Dispo: f/u 1 year.   Kate Sable, M.D., F.A.C.C.

## 2015-03-19 NOTE — Patient Instructions (Addendum)
Your physician has recommended you make the following change in your medication:  Stop atenolol. Continue all other medications the same. You have been referred to an ENT. Your physician recommends that you schedule a follow-up appointment in: 1 year. You will receive a reminder letter in the mail in about 10 months reminding you to call and schedule your appointment. If you don't receive this letter, please contact our office.

## 2015-04-06 ENCOUNTER — Ambulatory Visit (INDEPENDENT_AMBULATORY_CARE_PROVIDER_SITE_OTHER): Payer: Medicare Other | Admitting: *Deleted

## 2015-04-06 DIAGNOSIS — Q2112 Patent foramen ovale: Secondary | ICD-10-CM

## 2015-04-06 DIAGNOSIS — Q211 Atrial septal defect: Secondary | ICD-10-CM

## 2015-04-06 DIAGNOSIS — G459 Transient cerebral ischemic attack, unspecified: Secondary | ICD-10-CM

## 2015-04-06 DIAGNOSIS — Z7901 Long term (current) use of anticoagulants: Secondary | ICD-10-CM

## 2015-04-06 LAB — POCT INR: INR: 1.9

## 2015-04-07 ENCOUNTER — Ambulatory Visit (INDEPENDENT_AMBULATORY_CARE_PROVIDER_SITE_OTHER): Payer: Medicare Other

## 2015-04-07 DIAGNOSIS — I6523 Occlusion and stenosis of bilateral carotid arteries: Secondary | ICD-10-CM

## 2015-04-09 ENCOUNTER — Telehealth: Payer: Self-pay | Admitting: *Deleted

## 2015-04-09 NOTE — Telephone Encounter (Signed)
Patient also questioned if her ears could make her wobbly headed.  Stated she was going up the steps today & felt wobbly and off balance.  Informed her that there are many reasons she could feel that way.  She may have just not had good balance going up the steps, blood pressure, or ears.  Advised to monitor for now & if symptoms persist to see her PMD.  Also, can keep check on BP to make sure not dropping.  Patient verbalized understanding.

## 2015-04-09 NOTE — Telephone Encounter (Signed)
-----   Message from Herminio Commons, MD sent at 04/08/2015 10:41 AM EDT ----- Mild right and mild to moderate left-sided plaque disease. Repeat in 1 year.

## 2015-04-09 NOTE — Telephone Encounter (Signed)
Notes Recorded by Laurine Blazer, LPN on 05/06/4824 at 0:03 PM Patient notified. Copy fwd to pmd.

## 2015-05-04 ENCOUNTER — Ambulatory Visit (INDEPENDENT_AMBULATORY_CARE_PROVIDER_SITE_OTHER): Payer: Medicare Other | Admitting: *Deleted

## 2015-05-04 DIAGNOSIS — Q211 Atrial septal defect: Secondary | ICD-10-CM

## 2015-05-04 DIAGNOSIS — G459 Transient cerebral ischemic attack, unspecified: Secondary | ICD-10-CM

## 2015-05-04 DIAGNOSIS — Q2112 Patent foramen ovale: Secondary | ICD-10-CM

## 2015-05-04 DIAGNOSIS — Z7901 Long term (current) use of anticoagulants: Secondary | ICD-10-CM

## 2015-05-04 LAB — POCT INR: INR: 2.1

## 2015-06-03 ENCOUNTER — Ambulatory Visit (INDEPENDENT_AMBULATORY_CARE_PROVIDER_SITE_OTHER): Payer: Medicare Other | Admitting: *Deleted

## 2015-06-03 DIAGNOSIS — Z7901 Long term (current) use of anticoagulants: Secondary | ICD-10-CM | POA: Diagnosis not present

## 2015-06-03 DIAGNOSIS — G459 Transient cerebral ischemic attack, unspecified: Secondary | ICD-10-CM | POA: Diagnosis not present

## 2015-06-03 DIAGNOSIS — Q2112 Patent foramen ovale: Secondary | ICD-10-CM

## 2015-06-03 DIAGNOSIS — Q211 Atrial septal defect: Secondary | ICD-10-CM | POA: Diagnosis not present

## 2015-06-03 LAB — POCT INR: INR: 2.5

## 2015-07-01 ENCOUNTER — Ambulatory Visit (INDEPENDENT_AMBULATORY_CARE_PROVIDER_SITE_OTHER): Payer: Medicare Other | Admitting: *Deleted

## 2015-07-01 DIAGNOSIS — Z7901 Long term (current) use of anticoagulants: Secondary | ICD-10-CM | POA: Diagnosis not present

## 2015-07-01 DIAGNOSIS — Q211 Atrial septal defect: Secondary | ICD-10-CM

## 2015-07-01 DIAGNOSIS — G459 Transient cerebral ischemic attack, unspecified: Secondary | ICD-10-CM

## 2015-07-01 DIAGNOSIS — Q2112 Patent foramen ovale: Secondary | ICD-10-CM

## 2015-07-01 LAB — POCT INR: INR: 2.2

## 2015-08-12 ENCOUNTER — Ambulatory Visit (INDEPENDENT_AMBULATORY_CARE_PROVIDER_SITE_OTHER): Payer: Medicare Other | Admitting: *Deleted

## 2015-08-12 DIAGNOSIS — Z7901 Long term (current) use of anticoagulants: Secondary | ICD-10-CM

## 2015-08-12 DIAGNOSIS — G459 Transient cerebral ischemic attack, unspecified: Secondary | ICD-10-CM

## 2015-08-12 DIAGNOSIS — Q2112 Patent foramen ovale: Secondary | ICD-10-CM

## 2015-08-12 DIAGNOSIS — Q211 Atrial septal defect: Secondary | ICD-10-CM | POA: Diagnosis not present

## 2015-08-12 LAB — POCT INR: INR: 2.3

## 2015-09-23 ENCOUNTER — Ambulatory Visit (INDEPENDENT_AMBULATORY_CARE_PROVIDER_SITE_OTHER): Payer: Medicare Other | Admitting: *Deleted

## 2015-09-23 DIAGNOSIS — G459 Transient cerebral ischemic attack, unspecified: Secondary | ICD-10-CM | POA: Diagnosis not present

## 2015-09-23 DIAGNOSIS — Z7901 Long term (current) use of anticoagulants: Secondary | ICD-10-CM | POA: Diagnosis not present

## 2015-09-23 DIAGNOSIS — Q2112 Patent foramen ovale: Secondary | ICD-10-CM

## 2015-09-23 DIAGNOSIS — Q211 Atrial septal defect: Secondary | ICD-10-CM

## 2015-09-23 LAB — POCT INR: INR: 2

## 2015-11-04 ENCOUNTER — Ambulatory Visit (INDEPENDENT_AMBULATORY_CARE_PROVIDER_SITE_OTHER): Payer: Medicare Other | Admitting: *Deleted

## 2015-11-04 DIAGNOSIS — G459 Transient cerebral ischemic attack, unspecified: Secondary | ICD-10-CM | POA: Diagnosis not present

## 2015-11-04 DIAGNOSIS — Q211 Atrial septal defect: Secondary | ICD-10-CM

## 2015-11-04 DIAGNOSIS — Z7901 Long term (current) use of anticoagulants: Secondary | ICD-10-CM | POA: Diagnosis not present

## 2015-11-04 DIAGNOSIS — Q2112 Patent foramen ovale: Secondary | ICD-10-CM

## 2015-11-04 LAB — POCT INR: INR: 2.6

## 2015-12-09 ENCOUNTER — Ambulatory Visit (INDEPENDENT_AMBULATORY_CARE_PROVIDER_SITE_OTHER): Payer: Medicare Other | Admitting: *Deleted

## 2015-12-09 DIAGNOSIS — Q211 Atrial septal defect: Secondary | ICD-10-CM

## 2015-12-09 DIAGNOSIS — G459 Transient cerebral ischemic attack, unspecified: Secondary | ICD-10-CM

## 2015-12-09 DIAGNOSIS — Z7901 Long term (current) use of anticoagulants: Secondary | ICD-10-CM | POA: Diagnosis not present

## 2015-12-09 DIAGNOSIS — Q2112 Patent foramen ovale: Secondary | ICD-10-CM

## 2015-12-09 LAB — POCT INR: INR: 2.3

## 2016-01-20 ENCOUNTER — Ambulatory Visit (INDEPENDENT_AMBULATORY_CARE_PROVIDER_SITE_OTHER): Payer: Medicare Other | Admitting: *Deleted

## 2016-01-20 DIAGNOSIS — G459 Transient cerebral ischemic attack, unspecified: Secondary | ICD-10-CM | POA: Diagnosis not present

## 2016-01-20 DIAGNOSIS — Z7901 Long term (current) use of anticoagulants: Secondary | ICD-10-CM | POA: Diagnosis not present

## 2016-01-20 DIAGNOSIS — Q2112 Patent foramen ovale: Secondary | ICD-10-CM

## 2016-01-20 DIAGNOSIS — Q211 Atrial septal defect: Secondary | ICD-10-CM

## 2016-01-20 LAB — POCT INR: INR: 2

## 2016-03-07 ENCOUNTER — Ambulatory Visit (INDEPENDENT_AMBULATORY_CARE_PROVIDER_SITE_OTHER): Payer: Medicare Other | Admitting: *Deleted

## 2016-03-07 DIAGNOSIS — Q2112 Patent foramen ovale: Secondary | ICD-10-CM

## 2016-03-07 DIAGNOSIS — Q211 Atrial septal defect: Secondary | ICD-10-CM | POA: Diagnosis not present

## 2016-03-07 DIAGNOSIS — Z7901 Long term (current) use of anticoagulants: Secondary | ICD-10-CM

## 2016-03-07 DIAGNOSIS — G459 Transient cerebral ischemic attack, unspecified: Secondary | ICD-10-CM

## 2016-03-07 LAB — POCT INR: INR: 2.3

## 2016-03-27 ENCOUNTER — Other Ambulatory Visit: Payer: Self-pay | Admitting: Cardiovascular Disease

## 2016-03-27 DIAGNOSIS — I6523 Occlusion and stenosis of bilateral carotid arteries: Secondary | ICD-10-CM

## 2016-04-06 ENCOUNTER — Ambulatory Visit: Payer: Medicare Other

## 2016-04-06 DIAGNOSIS — I6523 Occlusion and stenosis of bilateral carotid arteries: Secondary | ICD-10-CM | POA: Diagnosis not present

## 2016-04-06 LAB — VAS US CAROTID
LCCADSYS: 62 cm/s
LCCAPDIAS: 7 cm/s
LCCAPSYS: 67 cm/s
LEFT ECA DIAS: 0 cm/s
LEFT VERTEBRAL DIAS: 14 cm/s
Left CCA dist dias: 13 cm/s
Left ICA dist dias: -24 cm/s
Left ICA dist sys: -125 cm/s
Left ICA prox dias: -31 cm/s
Left ICA prox sys: -119 cm/s
RCCAPDIAS: 17 cm/s
RCCAPSYS: 126 cm/s
RIGHT ECA DIAS: 0 cm/s
RIGHT VERTEBRAL DIAS: 14 cm/s
Right cca dist sys: -85 cm/s

## 2016-04-17 ENCOUNTER — Ambulatory Visit: Payer: Medicare Other | Admitting: Cardiovascular Disease

## 2016-04-18 ENCOUNTER — Ambulatory Visit (INDEPENDENT_AMBULATORY_CARE_PROVIDER_SITE_OTHER): Payer: Medicare Other | Admitting: *Deleted

## 2016-04-18 DIAGNOSIS — Q211 Atrial septal defect: Secondary | ICD-10-CM

## 2016-04-18 DIAGNOSIS — G459 Transient cerebral ischemic attack, unspecified: Secondary | ICD-10-CM | POA: Diagnosis not present

## 2016-04-18 DIAGNOSIS — I6523 Occlusion and stenosis of bilateral carotid arteries: Secondary | ICD-10-CM

## 2016-04-18 DIAGNOSIS — Z7901 Long term (current) use of anticoagulants: Secondary | ICD-10-CM

## 2016-04-18 DIAGNOSIS — Q2112 Patent foramen ovale: Secondary | ICD-10-CM

## 2016-04-18 LAB — POCT INR: INR: 1.6

## 2016-05-09 ENCOUNTER — Ambulatory Visit (INDEPENDENT_AMBULATORY_CARE_PROVIDER_SITE_OTHER): Payer: Medicare Other | Admitting: *Deleted

## 2016-05-09 DIAGNOSIS — Z7901 Long term (current) use of anticoagulants: Secondary | ICD-10-CM | POA: Diagnosis not present

## 2016-05-09 DIAGNOSIS — Q2112 Patent foramen ovale: Secondary | ICD-10-CM

## 2016-05-09 DIAGNOSIS — Q211 Atrial septal defect: Secondary | ICD-10-CM | POA: Diagnosis not present

## 2016-05-09 DIAGNOSIS — I6523 Occlusion and stenosis of bilateral carotid arteries: Secondary | ICD-10-CM | POA: Diagnosis not present

## 2016-05-09 DIAGNOSIS — G459 Transient cerebral ischemic attack, unspecified: Secondary | ICD-10-CM

## 2016-05-09 LAB — POCT INR: INR: 2

## 2016-05-17 ENCOUNTER — Encounter: Payer: Self-pay | Admitting: Cardiovascular Disease

## 2016-05-17 ENCOUNTER — Ambulatory Visit (INDEPENDENT_AMBULATORY_CARE_PROVIDER_SITE_OTHER): Payer: Medicare Other | Admitting: Cardiovascular Disease

## 2016-05-17 VITALS — BP 132/72 | HR 90 | Ht 64.0 in | Wt 175.0 lb

## 2016-05-17 DIAGNOSIS — I2510 Atherosclerotic heart disease of native coronary artery without angina pectoris: Secondary | ICD-10-CM | POA: Diagnosis not present

## 2016-05-17 DIAGNOSIS — Z7901 Long term (current) use of anticoagulants: Secondary | ICD-10-CM

## 2016-05-17 DIAGNOSIS — G458 Other transient cerebral ischemic attacks and related syndromes: Secondary | ICD-10-CM

## 2016-05-17 DIAGNOSIS — I6523 Occlusion and stenosis of bilateral carotid arteries: Secondary | ICD-10-CM

## 2016-05-17 DIAGNOSIS — I10 Essential (primary) hypertension: Secondary | ICD-10-CM

## 2016-05-17 DIAGNOSIS — Q211 Atrial septal defect: Secondary | ICD-10-CM | POA: Diagnosis not present

## 2016-05-17 DIAGNOSIS — Q2112 Patent foramen ovale: Secondary | ICD-10-CM

## 2016-05-17 NOTE — Progress Notes (Signed)
SUBJECTIVE: Mrs. Hedstrom is an 82 yr old woman with a h/o recurrent TIA's and a PFO, diabetes, essential HTN, hyperlipidemia, nonobstructive CAD and is on lifelong warfarin therapy. Carotid Dopplers on 04/06/16 demonstrated 40-59% left internal carotid artery stenosis and 1-39% right carotid artery stenosis.  She denies chest pain and shortness of breath. She walks on the treadmill and is able to walk without a cane inside her house. She says she feels stronger than she did at her last visit.  She is no longer on simvastatin as it led to lower extremity myalgias.   Review of Systems: As per "subjective", otherwise negative.  Allergies  Allergen Reactions  . Ivp Dye [Iodinated Diagnostic Agents]   . Penicillins     REACTION: unknown reaction  . Sulfonamide Derivatives     REACTION: unknown reaction    Current Outpatient Prescriptions  Medication Sig Dispense Refill  . amLODipine (NORVASC) 5 MG tablet Take 1 tablet (5 mg total) by mouth daily. 90 tablet 3  . aspirin EC 81 MG tablet Take 1 tablet (81 mg total) by mouth daily. 90 tablet 3  . Calcium Carbonate-Vitamin D (CALTRATE 600+D) 600-400 MG-UNIT per tablet Take 1 tablet by mouth daily.     . carboxymethylcellulose (REFRESH) 1 % ophthalmic solution Place 1 drop into both eyes as needed.     . docusate sodium (COLACE) 100 MG capsule Take 100 mg by mouth 2 (two) times daily.    . fexofenadine (ALLEGRA) 180 MG tablet Take 180 mg by mouth daily.      . fluticasone (FLONASE) 50 MCG/ACT nasal spray Place 1 spray into both nostrils daily.    Marland Kitchen glipiZIDE (GLUCOTROL XL) 2.5 MG 24 hr tablet Take 2.5 mg by mouth daily.    Marland Kitchen losartan (COZAAR) 100 MG tablet Take 100 mg by mouth daily.    . metFORMIN (GLUCOPHAGE-XR) 500 MG 24 hr tablet Take 500 mg by mouth daily with breakfast.      . Multiple Vitamin (MULTIVITAMIN) tablet Take 1 tablet by mouth daily.      . nitroGLYCERIN (NITROSTAT) 0.4 MG SL tablet Place 0.4 mg under the tongue every 5  (five) minutes as needed for chest pain.    Marland Kitchen omeprazole (PRILOSEC) 20 MG capsule Take 20 mg by mouth daily.      . simvastatin (ZOCOR) 20 MG tablet Take 20 mg by mouth at bedtime.      . ursodiol (ACTIGALL) 300 MG capsule Take 300 mg by mouth 2 (two) times daily.     . vitamin E (VITAMIN E) 400 UNIT capsule Take 400 Units by mouth daily.     Marland Kitchen warfarin (JANTOVEN) 5 MG tablet Take 1 tablet (5 mg total) by mouth daily. 90 tablet 3   No current facility-administered medications for this visit.     Past Medical History:  Diagnosis Date  . Allergic rhinitis   . Colonic polyp   . Diabetes mellitus   . GERD (gastroesophageal reflux disease)   . Hyperlipidemia   . Hypertension   . Nonalcoholic hepatosteatosis     Past Surgical History:  Procedure Laterality Date  . ABDOMINAL HYSTERECTOMY    . APPENDECTOMY    . CARDIAC CATHETERIZATION  2001   Nonobstructive CAD    Social History   Social History  . Marital status: Married    Spouse name: N/A  . Number of children: N/A  . Years of education: N/A   Occupational History  . Not on file.  Social History Main Topics  . Smoking status: Former Smoker    Packs/day: 2.00    Years: 40.00    Types: Cigarettes    Quit date: 06/05/1992  . Smokeless tobacco: Former Systems developer    Types: [place] date: 12/03/1992     Comment: used tobacco to help quit smoking  . Alcohol use No  . Drug use: No  . Sexual activity: Not on file   Other Topics Concern  . Not on file   Social History Narrative  . No narrative on file     Vitals:   05/17/16 1315  BP: 132/72  Pulse: 90  SpO2: 96%  Weight: 175 lb (79.4 kg)  Height: 5' 4"  (1.626 m)    PHYSICAL EXAM General: NAD HEENT: Normal. Neck: No JVD, no thyromegaly. Lungs: Clear to auscultation bilaterally with normal respiratory effort. CV: Nondisplaced PMI.  Regular rate and rhythm, normal S1/S2, no S3/S4, no murmur. No pretibial or periankle edema.  No carotid bruit.   Abdomen: Soft,  nontender, no distention.  Neurologic: Alert and oriented.  Psych: Normal affect. Skin: Normal. Musculoskeletal: No gross deformities.    ECG: Most recent ECG reviewed.      ASSESSMENT AND PLAN:  TIA (transient ischemic attack) -  Optimal BP control is of supreme importance. Continue warfarin for anticoagulation as below.   PFO (patent foramen ovale) -  Previously identified by saline contrast study on a limited echo. There was right-to-left shunting and also some evidence of a right ventricular aneurysm. Her most recent echo did not comment on this, but did mention normal size and function.   CORONARY ARTERY DISEASE -  She has had nonobstructive disease in the past with a negative stress perfusion study in September of 2012. Presently asymptomatic.   ESSENTIAL HYPERTENSION  Well controlled on current therapy. No changes.  Encounter for long-term (current) use of anticoagulants -  Continue lifelong Coumadin therapy unless any contraindications given her h/o recurrent TIA associated with a PFO. Continue ASA as well.   Carotid artery disease Most recent results noted above. Dopplers to be repeated in one year.   Dispo: f/u 1 year.     Kate Sable, M.D., F.A.C.C.

## 2016-05-17 NOTE — Patient Instructions (Signed)

## 2016-05-17 NOTE — Addendum Note (Signed)
Addended by: Laurine Blazer on: 05/17/2016 01:48 PM   Modules accepted: Orders

## 2016-06-06 ENCOUNTER — Ambulatory Visit (INDEPENDENT_AMBULATORY_CARE_PROVIDER_SITE_OTHER): Payer: Medicare Other | Admitting: *Deleted

## 2016-06-06 DIAGNOSIS — Z7901 Long term (current) use of anticoagulants: Secondary | ICD-10-CM | POA: Diagnosis not present

## 2016-06-06 DIAGNOSIS — Q211 Atrial septal defect: Secondary | ICD-10-CM

## 2016-06-06 DIAGNOSIS — Q2112 Patent foramen ovale: Secondary | ICD-10-CM

## 2016-06-06 DIAGNOSIS — G459 Transient cerebral ischemic attack, unspecified: Secondary | ICD-10-CM | POA: Diagnosis not present

## 2016-06-06 LAB — POCT INR: INR: 1.8

## 2016-06-27 ENCOUNTER — Ambulatory Visit (INDEPENDENT_AMBULATORY_CARE_PROVIDER_SITE_OTHER): Payer: Medicare Other | Admitting: *Deleted

## 2016-06-27 DIAGNOSIS — G459 Transient cerebral ischemic attack, unspecified: Secondary | ICD-10-CM

## 2016-06-27 DIAGNOSIS — Q211 Atrial septal defect: Secondary | ICD-10-CM | POA: Diagnosis not present

## 2016-06-27 DIAGNOSIS — Z7901 Long term (current) use of anticoagulants: Secondary | ICD-10-CM | POA: Diagnosis not present

## 2016-06-27 DIAGNOSIS — Q2112 Patent foramen ovale: Secondary | ICD-10-CM

## 2016-06-27 LAB — POCT INR: INR: 2.6

## 2016-07-25 ENCOUNTER — Ambulatory Visit (INDEPENDENT_AMBULATORY_CARE_PROVIDER_SITE_OTHER): Payer: Medicare Other | Admitting: *Deleted

## 2016-07-25 DIAGNOSIS — G459 Transient cerebral ischemic attack, unspecified: Secondary | ICD-10-CM | POA: Diagnosis not present

## 2016-07-25 DIAGNOSIS — Z7901 Long term (current) use of anticoagulants: Secondary | ICD-10-CM | POA: Diagnosis not present

## 2016-07-25 DIAGNOSIS — Q2112 Patent foramen ovale: Secondary | ICD-10-CM

## 2016-07-25 DIAGNOSIS — Q211 Atrial septal defect: Secondary | ICD-10-CM | POA: Diagnosis not present

## 2016-07-25 LAB — POCT INR: INR: 3.9

## 2016-08-15 ENCOUNTER — Ambulatory Visit (INDEPENDENT_AMBULATORY_CARE_PROVIDER_SITE_OTHER): Payer: Medicare Other | Admitting: *Deleted

## 2016-08-15 DIAGNOSIS — Q211 Atrial septal defect: Secondary | ICD-10-CM | POA: Diagnosis not present

## 2016-08-15 DIAGNOSIS — Z7901 Long term (current) use of anticoagulants: Secondary | ICD-10-CM

## 2016-08-15 DIAGNOSIS — G459 Transient cerebral ischemic attack, unspecified: Secondary | ICD-10-CM | POA: Diagnosis not present

## 2016-08-15 DIAGNOSIS — Q2112 Patent foramen ovale: Secondary | ICD-10-CM

## 2016-08-15 LAB — POCT INR: INR: 1.9

## 2016-09-05 ENCOUNTER — Ambulatory Visit (INDEPENDENT_AMBULATORY_CARE_PROVIDER_SITE_OTHER): Payer: Medicare Other | Admitting: *Deleted

## 2016-09-05 DIAGNOSIS — Z7901 Long term (current) use of anticoagulants: Secondary | ICD-10-CM | POA: Diagnosis not present

## 2016-09-05 DIAGNOSIS — G459 Transient cerebral ischemic attack, unspecified: Secondary | ICD-10-CM

## 2016-09-05 DIAGNOSIS — Q211 Atrial septal defect: Secondary | ICD-10-CM | POA: Diagnosis not present

## 2016-09-05 DIAGNOSIS — Q2112 Patent foramen ovale: Secondary | ICD-10-CM

## 2016-09-05 LAB — POCT INR: INR: 3

## 2016-10-10 ENCOUNTER — Ambulatory Visit (INDEPENDENT_AMBULATORY_CARE_PROVIDER_SITE_OTHER): Payer: Medicare Other | Admitting: *Deleted

## 2016-10-10 DIAGNOSIS — Q211 Atrial septal defect: Secondary | ICD-10-CM | POA: Diagnosis not present

## 2016-10-10 DIAGNOSIS — G459 Transient cerebral ischemic attack, unspecified: Secondary | ICD-10-CM

## 2016-10-10 DIAGNOSIS — Q2112 Patent foramen ovale: Secondary | ICD-10-CM

## 2016-10-10 DIAGNOSIS — Z7901 Long term (current) use of anticoagulants: Secondary | ICD-10-CM

## 2016-10-10 LAB — POCT INR: INR: 2.2

## 2016-11-14 ENCOUNTER — Ambulatory Visit (INDEPENDENT_AMBULATORY_CARE_PROVIDER_SITE_OTHER): Payer: Medicare Other | Admitting: *Deleted

## 2016-11-14 DIAGNOSIS — G459 Transient cerebral ischemic attack, unspecified: Secondary | ICD-10-CM

## 2016-11-14 DIAGNOSIS — Q211 Atrial septal defect: Secondary | ICD-10-CM

## 2016-11-14 DIAGNOSIS — Q2112 Patent foramen ovale: Secondary | ICD-10-CM

## 2016-11-14 DIAGNOSIS — Z7901 Long term (current) use of anticoagulants: Secondary | ICD-10-CM | POA: Diagnosis not present

## 2016-11-14 LAB — POCT INR: INR: 3.4

## 2016-12-12 ENCOUNTER — Ambulatory Visit (INDEPENDENT_AMBULATORY_CARE_PROVIDER_SITE_OTHER): Payer: Medicare Other | Admitting: *Deleted

## 2016-12-12 DIAGNOSIS — G459 Transient cerebral ischemic attack, unspecified: Secondary | ICD-10-CM | POA: Diagnosis not present

## 2016-12-12 DIAGNOSIS — Z7901 Long term (current) use of anticoagulants: Secondary | ICD-10-CM | POA: Diagnosis not present

## 2016-12-12 DIAGNOSIS — Q211 Atrial septal defect: Secondary | ICD-10-CM

## 2016-12-12 DIAGNOSIS — Q2112 Patent foramen ovale: Secondary | ICD-10-CM

## 2016-12-12 LAB — POCT INR: INR: 2.6

## 2017-01-09 ENCOUNTER — Ambulatory Visit (INDEPENDENT_AMBULATORY_CARE_PROVIDER_SITE_OTHER): Payer: Medicare Other | Admitting: *Deleted

## 2017-01-09 DIAGNOSIS — G459 Transient cerebral ischemic attack, unspecified: Secondary | ICD-10-CM | POA: Diagnosis not present

## 2017-01-09 DIAGNOSIS — Z7901 Long term (current) use of anticoagulants: Secondary | ICD-10-CM | POA: Diagnosis not present

## 2017-01-09 DIAGNOSIS — Q2112 Patent foramen ovale: Secondary | ICD-10-CM

## 2017-01-09 DIAGNOSIS — Q211 Atrial septal defect: Secondary | ICD-10-CM | POA: Diagnosis not present

## 2017-01-09 LAB — POCT INR: INR: 2.2

## 2017-02-06 ENCOUNTER — Ambulatory Visit (INDEPENDENT_AMBULATORY_CARE_PROVIDER_SITE_OTHER): Payer: Medicare Other | Admitting: *Deleted

## 2017-02-06 DIAGNOSIS — Z7901 Long term (current) use of anticoagulants: Secondary | ICD-10-CM | POA: Diagnosis not present

## 2017-02-06 DIAGNOSIS — G459 Transient cerebral ischemic attack, unspecified: Secondary | ICD-10-CM

## 2017-02-06 DIAGNOSIS — G458 Other transient cerebral ischemic attacks and related syndromes: Secondary | ICD-10-CM

## 2017-02-06 DIAGNOSIS — Q211 Atrial septal defect: Secondary | ICD-10-CM | POA: Diagnosis not present

## 2017-02-06 DIAGNOSIS — Z5181 Encounter for therapeutic drug level monitoring: Secondary | ICD-10-CM | POA: Diagnosis not present

## 2017-02-06 DIAGNOSIS — Q2112 Patent foramen ovale: Secondary | ICD-10-CM

## 2017-02-06 LAB — POCT INR: INR: 2.4

## 2017-03-20 ENCOUNTER — Ambulatory Visit (INDEPENDENT_AMBULATORY_CARE_PROVIDER_SITE_OTHER): Payer: Medicare Other | Admitting: *Deleted

## 2017-03-20 DIAGNOSIS — Z7901 Long term (current) use of anticoagulants: Secondary | ICD-10-CM

## 2017-03-20 DIAGNOSIS — Q211 Atrial septal defect: Secondary | ICD-10-CM | POA: Diagnosis not present

## 2017-03-20 DIAGNOSIS — G459 Transient cerebral ischemic attack, unspecified: Secondary | ICD-10-CM

## 2017-03-20 DIAGNOSIS — Q2112 Patent foramen ovale: Secondary | ICD-10-CM

## 2017-03-20 LAB — POCT INR: INR: 2.4

## 2017-05-01 ENCOUNTER — Ambulatory Visit (INDEPENDENT_AMBULATORY_CARE_PROVIDER_SITE_OTHER): Payer: Medicare Other | Admitting: *Deleted

## 2017-05-01 DIAGNOSIS — G459 Transient cerebral ischemic attack, unspecified: Secondary | ICD-10-CM

## 2017-05-01 DIAGNOSIS — Z7901 Long term (current) use of anticoagulants: Secondary | ICD-10-CM | POA: Diagnosis not present

## 2017-05-01 DIAGNOSIS — Q2112 Patent foramen ovale: Secondary | ICD-10-CM

## 2017-05-01 DIAGNOSIS — Q211 Atrial septal defect: Secondary | ICD-10-CM

## 2017-05-01 LAB — POCT INR: INR: 1.8

## 2017-05-17 ENCOUNTER — Ambulatory Visit: Payer: Medicare Other | Admitting: Cardiovascular Disease

## 2017-05-22 ENCOUNTER — Other Ambulatory Visit: Payer: Self-pay

## 2017-05-22 ENCOUNTER — Ambulatory Visit (INDEPENDENT_AMBULATORY_CARE_PROVIDER_SITE_OTHER): Payer: Medicare Other | Admitting: *Deleted

## 2017-05-22 ENCOUNTER — Encounter: Payer: Self-pay | Admitting: Cardiovascular Disease

## 2017-05-22 ENCOUNTER — Ambulatory Visit (INDEPENDENT_AMBULATORY_CARE_PROVIDER_SITE_OTHER): Payer: Medicare Other | Admitting: Cardiovascular Disease

## 2017-05-22 VITALS — BP 138/88 | HR 81 | Ht 64.0 in | Wt 171.0 lb

## 2017-05-22 DIAGNOSIS — Q2112 Patent foramen ovale: Secondary | ICD-10-CM

## 2017-05-22 DIAGNOSIS — Z7901 Long term (current) use of anticoagulants: Secondary | ICD-10-CM

## 2017-05-22 DIAGNOSIS — I2510 Atherosclerotic heart disease of native coronary artery without angina pectoris: Secondary | ICD-10-CM | POA: Diagnosis not present

## 2017-05-22 DIAGNOSIS — G459 Transient cerebral ischemic attack, unspecified: Secondary | ICD-10-CM

## 2017-05-22 DIAGNOSIS — I10 Essential (primary) hypertension: Secondary | ICD-10-CM

## 2017-05-22 DIAGNOSIS — Q211 Atrial septal defect: Secondary | ICD-10-CM | POA: Diagnosis not present

## 2017-05-22 DIAGNOSIS — I6523 Occlusion and stenosis of bilateral carotid arteries: Secondary | ICD-10-CM

## 2017-05-22 LAB — POCT INR: INR: 2.3

## 2017-05-22 NOTE — Patient Instructions (Signed)

## 2017-05-22 NOTE — Progress Notes (Signed)
SUBJECTIVE: The patient presents for routine follow-up.  She has a history of recurrent TIA's and a PFO, diabetes, essential HTN, hyperlipidemia, nonobstructive CAD and is on lifelong warfarin therapy.  Carotid Dopplers on 04/06/16 demonstrated 40-59% left internal carotid artery stenosis and 1-39% right carotid artery stenosis.  She has not had any hospitalizations this past year.  She denies chest pain, palpitations, and shortness of breath.  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  She did have some falls when she tried to ambulate without her cane.  She also talks about some generalized hair loss.  ECG performed today which I personally interpreted demonstrated sinus rhythm with mild nonspecific T wave abnormalities.       Review of Systems: As per "subjective", otherwise negative.  Allergies  Allergen Reactions  . Ivp Dye [Iodinated Diagnostic Agents]   . Penicillins     REACTION: unknown reaction  . Sulfonamide Derivatives     REACTION: unknown reaction    Current Outpatient Medications  Medication Sig Dispense Refill  . amLODipine (NORVASC) 5 MG tablet Take 1 tablet (5 mg total) by mouth daily. 90 tablet 3  . aspirin EC 81 MG tablet Take 1 tablet (81 mg total) by mouth daily. 90 tablet 3  . Calcium Carbonate-Vitamin D (CALTRATE 600+D) 600-400 MG-UNIT per tablet Take 1 tablet by mouth daily.     . carboxymethylcellulose (REFRESH) 1 % ophthalmic solution Place 1 drop into both eyes as needed.     . docusate sodium (COLACE) 100 MG capsule Take 100 mg by mouth 2 (two) times daily.    . fexofenadine (ALLEGRA) 180 MG tablet Take 180 mg by mouth daily.      . fluticasone (FLONASE) 50 MCG/ACT nasal spray Place 1 spray into both nostrils daily.    Marland Kitchen glipiZIDE (GLUCOTROL XL) 2.5 MG 24 hr tablet Take 2.5 mg by mouth daily.    Marland Kitchen losartan (COZAAR) 100 MG tablet Take 100 mg by mouth daily.    . metFORMIN (GLUCOPHAGE-XR) 500  MG 24 hr tablet Take 500 mg by mouth daily with breakfast.      . Multiple Vitamin (MULTIVITAMIN) tablet Take 1 tablet by mouth daily.      . nitroGLYCERIN (NITROSTAT) 0.4 MG SL tablet Place 0.4 mg under the tongue every 5 (five) minutes as needed for chest pain.    Marland Kitchen omeprazole (PRILOSEC) 20 MG capsule Take 20 mg by mouth daily.      . ursodiol (ACTIGALL) 300 MG capsule Take 300 mg by mouth 2 (two) times daily.     . vitamin E (VITAMIN E) 400 UNIT capsule Take 400 Units by mouth daily.     Marland Kitchen warfarin (JANTOVEN) 5 MG tablet Take 1 tablet (5 mg total) by mouth daily. 90 tablet 3   No current facility-administered medications for this visit.     Past Medical History:  Diagnosis Date  . Allergic rhinitis   . Colonic polyp   . Diabetes mellitus   . GERD (gastroesophageal reflux disease)   . Hyperlipidemia   . Hypertension   . Nonalcoholic hepatosteatosis       Social History   Socioeconomic History  . Marital status: Married    Spouse name: Not on file  . Number of children: Not on file  . Years of education: Not on file  . Highest education level: Not on file  Social Needs  . Financial resource strain: Not on file  .  Food insecurity - worry: Not on file  . Food insecurity - inability: Not on file  . Transportation needs - medical: Not on file  . Transportation needs - non-medical: Not on file  Occupational History  . Not on file  Tobacco Use  . Smoking status: Former Smoker    Packs/day: 2.00    Years: 40.00    Pack years: 80.00    Types: Cigarettes    Last attempt to quit: 06/05/1992    Years since quitting: 24.9  . Smokeless tobacco: Former Systems developer    Types: Chew    Quit date: 12/03/1992  . Tobacco comment: used tobacco to help quit smoking  Substance and Sexual Activity  . Alcohol use: No  . Drug use: No  . Sexual activity: Not on file  Other Topics Concern  . Not on file  Social History Narrative  . Not on file     Vitals:   05/22/17 0952  BP: 138/88    Pulse: 81  SpO2: 96%  Weight: 171 lb (77.6 kg)  Height: 5' 4"  (1.626 m)    Wt Readings from Last 3 Encounters:  05/22/17 171 lb (77.6 kg)  05/17/16 175 lb (79.4 kg)  03/19/15 174 lb (78.9 kg)     PHYSICAL EXAM General: NAD HEENT: Normal. Neck: No JVD, no thyromegaly. Lungs: Clear to auscultation bilaterally with normal respiratory effort. CV: Regular rate and rhythm, normal S1/S2, no S3/S4, no murmur. No pretibial or periankle edema.  No carotid bruit.   Abdomen: Soft, nontender, no distention.  Neurologic: Alert and oriented.  Psych: Normal affect. Skin: Normal. Musculoskeletal: No gross deformities.    ECG: Most recent ECG reviewed.   Labs: Lab Results  Component Value Date/Time   K 4.0 07/22/2007 11:16 AM   BUN 19 07/22/2007 11:16 AM   CREATININE 0.70 07/22/2007 11:16 AM   HGB 13.1 07/22/2007 11:16 AM     Lipids: No results found for: LDLCALC, LDLDIRECT, CHOL, TRIG, HDL     ASSESSMENT AND PLAN:  1.  Recurrent TIAs: On lifelong anticoagulation with warfarin.  Blood pressure is reasonably controlled.  No changes to therapy.  2.  PFO: Previously identified by saline contrast study on a limited echo. There was right-to-left shunting and also some evidence of a right ventricular aneurysm. Her most recent echo did not comment on this, but did mention normal size and function.   3.  Coronary disease: Nonobstructive disease with a negative stress perfusion study in September 2012.  Asymptomatic.  No changes to therapy.  4.  Chronic hypertension: Blood pressure is reasonably controlled.  No changes to therapy.  5.  Bilateral carotid artery disease: Results from November 2017 detailed above.  I will repeat Dopplers in 1 year.    Disposition: Follow up 1 year.   Kate Sable, M.D., F.A.C.C.

## 2017-06-19 ENCOUNTER — Ambulatory Visit (INDEPENDENT_AMBULATORY_CARE_PROVIDER_SITE_OTHER): Payer: Medicare Other | Admitting: *Deleted

## 2017-06-19 DIAGNOSIS — G459 Transient cerebral ischemic attack, unspecified: Secondary | ICD-10-CM

## 2017-06-19 DIAGNOSIS — Q2112 Patent foramen ovale: Secondary | ICD-10-CM

## 2017-06-19 DIAGNOSIS — Q211 Atrial septal defect: Secondary | ICD-10-CM | POA: Diagnosis not present

## 2017-06-19 DIAGNOSIS — Z7901 Long term (current) use of anticoagulants: Secondary | ICD-10-CM

## 2017-06-19 LAB — POCT INR: INR: 1.9

## 2017-06-19 NOTE — Patient Instructions (Signed)
Take coumadin 2 tablets tonight then resume 1 tablet daily except 1 1/2 tablets on Tuesdays and Saturdays Recheck in 4 weeks

## 2017-07-17 ENCOUNTER — Ambulatory Visit (INDEPENDENT_AMBULATORY_CARE_PROVIDER_SITE_OTHER): Payer: Medicare Other | Admitting: *Deleted

## 2017-07-17 DIAGNOSIS — Q211 Atrial septal defect: Secondary | ICD-10-CM | POA: Diagnosis not present

## 2017-07-17 DIAGNOSIS — G459 Transient cerebral ischemic attack, unspecified: Secondary | ICD-10-CM | POA: Diagnosis not present

## 2017-07-17 DIAGNOSIS — Q2112 Patent foramen ovale: Secondary | ICD-10-CM

## 2017-07-17 DIAGNOSIS — Z7901 Long term (current) use of anticoagulants: Secondary | ICD-10-CM

## 2017-07-17 LAB — POCT INR: INR: 1.6

## 2017-07-17 NOTE — Patient Instructions (Signed)
Take coumadin extra 1/2 tablet today, take 2 tablets tomorrow then resume 1 tablet daily except 1 1/2 tablets on Tuesdays and Saturdays Recheck in 2 weeks

## 2017-08-03 ENCOUNTER — Ambulatory Visit (INDEPENDENT_AMBULATORY_CARE_PROVIDER_SITE_OTHER): Payer: Medicare Other | Admitting: *Deleted

## 2017-08-03 DIAGNOSIS — G459 Transient cerebral ischemic attack, unspecified: Secondary | ICD-10-CM | POA: Diagnosis not present

## 2017-08-03 LAB — POCT INR: INR: 2.8

## 2017-08-03 NOTE — Patient Instructions (Signed)
Continue coumadin 1 tablet daily except 1 1/2 tablets on Tuesdays and Saturdays Recheck in 4 weeks

## 2017-08-30 ENCOUNTER — Ambulatory Visit (INDEPENDENT_AMBULATORY_CARE_PROVIDER_SITE_OTHER): Payer: Medicare Other | Admitting: *Deleted

## 2017-08-30 DIAGNOSIS — Z5181 Encounter for therapeutic drug level monitoring: Secondary | ICD-10-CM | POA: Diagnosis not present

## 2017-08-30 DIAGNOSIS — Q2112 Patent foramen ovale: Secondary | ICD-10-CM

## 2017-08-30 DIAGNOSIS — G459 Transient cerebral ischemic attack, unspecified: Secondary | ICD-10-CM

## 2017-08-30 DIAGNOSIS — Q211 Atrial septal defect: Secondary | ICD-10-CM

## 2017-08-30 DIAGNOSIS — Z7901 Long term (current) use of anticoagulants: Secondary | ICD-10-CM

## 2017-08-30 LAB — POCT INR: INR: 2.4

## 2017-08-30 NOTE — Patient Instructions (Signed)
Continue coumadin 1 tablet daily except 1 1/2 tablets on Tuesdays and Saturdays Recheck in 4 weeks

## 2017-09-25 ENCOUNTER — Ambulatory Visit (INDEPENDENT_AMBULATORY_CARE_PROVIDER_SITE_OTHER): Payer: Medicare Other | Admitting: *Deleted

## 2017-09-25 DIAGNOSIS — G459 Transient cerebral ischemic attack, unspecified: Secondary | ICD-10-CM | POA: Diagnosis not present

## 2017-09-25 DIAGNOSIS — Z7901 Long term (current) use of anticoagulants: Secondary | ICD-10-CM | POA: Diagnosis not present

## 2017-09-25 DIAGNOSIS — Z5181 Encounter for therapeutic drug level monitoring: Secondary | ICD-10-CM

## 2017-09-25 LAB — POCT INR: INR: 2.5

## 2017-09-25 NOTE — Patient Instructions (Signed)
Continue coumadin 1 tablet daily except 1 1/2 tablets on Tuesdays and Saturdays Recheck in 6 weeks

## 2017-11-06 ENCOUNTER — Ambulatory Visit (INDEPENDENT_AMBULATORY_CARE_PROVIDER_SITE_OTHER): Payer: Medicare Other | Admitting: *Deleted

## 2017-11-06 DIAGNOSIS — G459 Transient cerebral ischemic attack, unspecified: Secondary | ICD-10-CM | POA: Diagnosis not present

## 2017-11-06 DIAGNOSIS — Z7901 Long term (current) use of anticoagulants: Secondary | ICD-10-CM | POA: Diagnosis not present

## 2017-11-06 DIAGNOSIS — Z5181 Encounter for therapeutic drug level monitoring: Secondary | ICD-10-CM | POA: Diagnosis not present

## 2017-11-06 LAB — POCT INR: INR: 3.3 — AB (ref 2.0–3.0)

## 2017-11-06 NOTE — Patient Instructions (Signed)
Take coumadin 1/2 tablet tonight then decrease dose to 1 tablet daily except 1 1/2 tablets on Saturdays Recheck in 4 weeks Started on Zetia 1 week ago

## 2017-12-04 ENCOUNTER — Ambulatory Visit (INDEPENDENT_AMBULATORY_CARE_PROVIDER_SITE_OTHER): Payer: Medicare Other | Admitting: *Deleted

## 2017-12-04 DIAGNOSIS — Z5181 Encounter for therapeutic drug level monitoring: Secondary | ICD-10-CM | POA: Diagnosis not present

## 2017-12-04 DIAGNOSIS — Z7901 Long term (current) use of anticoagulants: Secondary | ICD-10-CM | POA: Diagnosis not present

## 2017-12-04 DIAGNOSIS — G459 Transient cerebral ischemic attack, unspecified: Secondary | ICD-10-CM

## 2017-12-04 LAB — POCT INR: INR: 2.7 (ref 2.0–3.0)

## 2017-12-04 NOTE — Patient Instructions (Signed)
Continue coumadin 1 tablet daily except 1 1/2 tablets on Saturdays Recheck in 4 weeks

## 2018-01-01 ENCOUNTER — Ambulatory Visit (INDEPENDENT_AMBULATORY_CARE_PROVIDER_SITE_OTHER): Payer: Medicare Other | Admitting: *Deleted

## 2018-01-01 ENCOUNTER — Telehealth: Payer: Self-pay | Admitting: Cardiovascular Disease

## 2018-01-01 DIAGNOSIS — G459 Transient cerebral ischemic attack, unspecified: Secondary | ICD-10-CM | POA: Diagnosis not present

## 2018-01-01 DIAGNOSIS — Z7901 Long term (current) use of anticoagulants: Secondary | ICD-10-CM

## 2018-01-01 LAB — POCT INR: INR: 2.5 (ref 2.0–3.0)

## 2018-01-01 NOTE — Telephone Encounter (Signed)
Pt says for the last 2 weeks c/o night time (during bedtime routine of changing clothes) left arm pain and takes nitroglycerin with relief of pain, only has to take one every night. Denies any chest pain/SOB/dizziness/swelling - says she does stagger sometimes when walking around - pt denies any symptoms at this time. BP today is 130/84 HR 67 - pt doesn't have another appt with Dr Bronson Ing until December - scheduled 8/27 with [place] and will forward to provider for additional suggestions.

## 2018-01-01 NOTE — Patient Instructions (Signed)
Continue coumadin 1 tablet daily except 1 1/2 tablets on Saturdays Recheck in 4 weeks

## 2018-01-01 NOTE — Telephone Encounter (Signed)
Pt aware and will continue to monitor symptoms

## 2018-01-01 NOTE — Telephone Encounter (Signed)
She will likely need a stress test but I will await her office evaluation on 8/27.  If symptoms progress or should she develop chest pain and/or exertional dyspnea, I would recommend she be evaluated in the ED.

## 2018-01-01 NOTE — Telephone Encounter (Signed)
[name] came today for her CCR check.  She mentioned to our coumdin nurse that she had been having pains in her left arm especially at night time.

## 2018-01-17 ENCOUNTER — Encounter: Payer: Self-pay | Admitting: Nutrition

## 2018-01-17 ENCOUNTER — Encounter: Payer: Medicare Other | Attending: Family Medicine | Admitting: Nutrition

## 2018-01-17 VITALS — Ht 64.0 in | Wt 172.6 lb

## 2018-01-17 DIAGNOSIS — IMO0002 Reserved for concepts with insufficient information to code with codable children: Secondary | ICD-10-CM

## 2018-01-17 DIAGNOSIS — Z713 Dietary counseling and surveillance: Secondary | ICD-10-CM | POA: Insufficient documentation

## 2018-01-17 DIAGNOSIS — E119 Type 2 diabetes mellitus without complications: Secondary | ICD-10-CM | POA: Insufficient documentation

## 2018-01-17 DIAGNOSIS — Z6829 Body mass index (BMI) 29.0-29.9, adult: Secondary | ICD-10-CM

## 2018-01-17 DIAGNOSIS — E1165 Type 2 diabetes mellitus with hyperglycemia: Secondary | ICD-10-CM

## 2018-01-17 DIAGNOSIS — E118 Type 2 diabetes mellitus with unspecified complications: Secondary | ICD-10-CM

## 2018-01-17 NOTE — Patient Instructions (Signed)
Goals 1 Follow MY Plate 2. Increase fresh fruits and vegetables. 3. Increase high fiber foods 4. Use MRs Dash 5. Cut out processed foods, meats and sodas.

## 2018-01-17 NOTE — Progress Notes (Signed)
  Medical Nutrition Therapy:  Appt start time: 1000 end time:  1100.  Assessment:  Primary concerns today: Diabetes Type 2. A1C 7.1%. LIves with her husband. Her husband cooks and shops. Has had 1-2  mini strokes. Checks BS a few times per week.Marland Kitchen FBS was 201 mg/dl this due to eating late last night. BS usually 170-190's in am.  Taking GLipizide in  pm and Metformin 500 mg ER in am. Eats 3 meals per day. LImited mobiiity. Walks with a cane. Husband does most of her care. Engaged and willing to make changes with her diet for needed weight loss and improve heart and BS. Diet is higher in fat, salt and excessive calories. Low in fresh fruits and vegetables. Drinks sodas and tea at times but usually unsweet tea.    Preferred Learning Style:  Auditory  No preference indicated   Learning Readiness:  Ready  Change in progress MEDICATIONS   DIETARY INTAKE:  24-hr recall:  B ( AM):  Eggs and toast, sometimes bacon and grits, coffee Snk ( AM): sometimes fruit bar,   L ( PM): CHicken, mashed potatoes and green beans and bite of biscuit,  Sweet tea or unsweet or water Snk ( PM):  D ( PM): 730 pm pears and crackers, water Snk ( PM):  Beverages: water, soda, tea or unsweet tea  Usual physical activity ADL   Estimated energy needs: 1200  calories 135 g carbohydrates 90 g protein 33 g fat  Progress Towards Goal(s):  In progress.   Nutritional Diagnosis:  NB-1.1 Food and nutrition-related knowledge deficit As related to Diabetes and overweight   As evidenced by A1c 7.1 % AND BMI 29    Intervention:  Nutrition and Diabetes education provided on My Plate, CHO counting, meal planning, portion sizes, timing of meals, avoiding snacks between meals unless having a low blood sugar, target ranges for A1C and blood sugars, signs/symptoms and treatment of hyper/hypoglycemia, monitoring blood sugars, taking medications as prescribed, benefits of exercising 30 minutes per day and prevention of  complications of DM. Low Salt Diet and HIgh Fiber foods  Goals 1 Follow MY Plate 2. Increase fresh fruits and vegetables. 3. Increase high fiber foods 4. Use MRs Dash 5. Cut out processed foods, meats and sodas.   Teaching Method Utilized:  Visual Auditory Hands on  Handouts given during visit include:  The Plate Method   Meal Plan Card  Diabetes Instrucitons  Low Salt Heart Healthy Handout    Barriers to learning/adherence to lifestyle change: none  Demonstrated degree of understanding via:  Teach Back   Monitoring/Evaluation:  Dietary intake, exercise, meal plannign , and body weight in 1 month(s).

## 2018-01-28 NOTE — Progress Notes (Signed)
Cardiology Office Note    Date:  01/29/2018   ID:  [name], DOB [birth year], MRN [id number]  PCP:  Patient, No Pcp Per  Cardiologist: Kate Sable, MD    Chief Complaint  Patient presents with  . Follow-up    recent left arm pain and dyspnea on exertion    History of Present Illness:    [name] is a 84 y.o. female with past medical history of carotid artery stenosis, HTN, HLD, Type II DM, and recurrent TIA's (with PFO noted on prior echocardiogram, on Coumadin for anticoagulation) who presents to the office today for evaluation of left arm pain.  She was last examined by Dr. Bronson Ing in 05/2017 and denied any recent chest pain or dyspnea on exertion at that time. Was continued on her current medication regimen. She presented to the office on 01/01/2018 for a routine Coumadin check and reported having left arm pain over the past few weeks which was relieved with sublingual nitroglycerin. Therefore, close follow-up was recommended for further evaluation.  In talking with the patient today, she reports having episodes of left arm pain over the past few months which can occur at rest or with activity. Reports her symptoms are sometimes worse at night and she would massage her left arm with improvement. Symptoms did not resolve a few nights ago with massage and she took sublingual nitroglycerin with resolution of her symptoms. She denies any specific chest discomfort but has noticed worsening dyspnea on exertion over the past several weeks as well. No recent orthopnea, PND, or lower extremity edema.  She has no known history of CAD but reports her mother died of heart attack and she does have multiple cardiac risk factors including HTN, HLD, and Type II DM. Her cholesterol has been followed by her PCP and she has been intolerant to multiple statins and was tried on Zetia over the past few months but experienced myalgias with this as well   Past Medical History:  Diagnosis  Date  . Allergic rhinitis   . Colonic polyp   . Diabetes mellitus   . GERD (gastroesophageal reflux disease)   . Hyperlipidemia   . Hypertension   . Nonalcoholic hepatosteatosis     Past Surgical History:  Procedure Laterality Date  . ABDOMINAL HYSTERECTOMY    . APPENDECTOMY    . CARDIAC CATHETERIZATION  2001   Nonobstructive CAD    Current Medications: Outpatient Medications Prior to Visit  Medication Sig Dispense Refill  . aspirin EC 81 MG tablet Take 1 tablet (81 mg total) by mouth daily. 90 tablet 3  . carboxymethylcellulose (REFRESH) 1 % ophthalmic solution Place 1 drop into both eyes as needed.     . docusate sodium (COLACE) 100 MG capsule Take 100 mg by mouth 2 (two) times daily.    . fexofenadine (ALLEGRA) 180 MG tablet Take 180 mg by mouth daily.      . fluticasone (FLONASE) 50 MCG/ACT nasal spray Place 1 spray into both nostrils daily.    Marland Kitchen glipiZIDE (GLUCOTROL XL) 2.5 MG 24 hr tablet Take 2.5 mg by mouth daily.    Marland Kitchen losartan (COZAAR) 100 MG tablet Take 100 mg by mouth daily.    . metFORMIN (GLUCOPHAGE-XR) 500 MG 24 hr tablet Take 500 mg by mouth daily with breakfast.      . Multiple Vitamin (MULTIVITAMIN) tablet Take 1 tablet by mouth daily.      . nitroGLYCERIN (NITROSTAT) 0.4 MG SL tablet Place 0.4 mg under the  tongue every 5 (five) minutes as needed for chest pain.    Marland Kitchen omeprazole (PRILOSEC) 20 MG capsule Take 20 mg by mouth daily.      . ursodiol (ACTIGALL) 300 MG capsule Take 300 mg by mouth 2 (two) times daily.     . vitamin E (VITAMIN E) 400 UNIT capsule Take 400 Units by mouth daily.     Marland Kitchen amLODipine (NORVASC) 5 MG tablet Take 1 tablet (5 mg total) by mouth daily. 90 tablet 3  . ezetimibe (ZETIA) 10 MG tablet Take 10 mg by mouth daily.    Marland Kitchen warfarin (JANTOVEN) 5 MG tablet Take 1 tablet (5 mg total) by mouth daily. 90 tablet 3   No facility-administered medications prior to visit.      Allergies:   Ivp dye [iodinated diagnostic agents]; Lipitor [atorvastatin  calcium]; Penicillins; Sulfonamide derivatives; and Zetia [ezetimibe]   Social History   Socioeconomic History  . Marital status: Married    Spouse name: Not on file  . Number of children: Not on file  . Years of education: Not on file  . Highest education level: Not on file  Occupational History  . Not on file  Social Needs  . Financial resource strain: Not on file  . Food insecurity:    Worry: Not on file    Inability: Not on file  . Transportation needs:    Medical: Not on file    Non-medical: Not on file  Tobacco Use  . Smoking status: Former Smoker    Packs/day: 2.00    Years: 40.00    Pack years: 80.00    Types: Cigarettes    Last attempt to quit: 06/05/1992    Years since quitting: 25.6  . Smokeless tobacco: Former Systems developer    Types: Chew    Quit date: 12/03/1992  . Tobacco comment: used tobacco to help quit smoking  Substance and Sexual Activity  . Alcohol use: No  . Drug use: No  . Sexual activity: Not on file  Lifestyle  . Physical activity:    Days per week: Not on file    Minutes per session: Not on file  . Stress: Not on file  Relationships  . Social connections:    Talks on phone: Not on file    Gets together: Not on file    Attends religious service: Not on file    Active member of club or organization: Not on file    Attends meetings of clubs or organizations: Not on file    Relationship status: Not on file  Other Topics Concern  . Not on file  Social History Narrative  . Not on file     Family History:  The patient's family history includes Heart failure in her mother; Prostate cancer in her brother and father.   Review of Systems:   Please see the history of present illness.     General:  No chills, fever, night sweats or weight changes.  Cardiovascular:  No chest pain, edema, orthopnea, palpitations, paroxysmal nocturnal dyspnea.  Positive for dyspnea on exertion and left arm pain. Dermatological: No rash, lesions/masses Respiratory: No cough,  dyspnea Urologic: No hematuria, dysuria Abdominal:   No nausea, vomiting, diarrhea, bright red blood per rectum, melena, or hematemesis Neurologic:  No visual changes, wkns, changes in mental status. All other systems reviewed and are otherwise negative except as noted above.   Physical Exam:    VS:  BP (!) 164/80 (BP Location: Right Arm)   Pulse 89  Ht 5' 4"  (1.626 m)   Wt 170 lb (77.1 kg)   SpO2 94%   BMI 29.18 kg/m    General: Well developed, elderly Caucasian female appearing in no acute distress. Head: Normocephalic, atraumatic, sclera non-icteric, no xanthomas, nares are without discharge.  Neck: No carotid bruits. JVD not elevated.  Lungs: Respirations regular and unlabored, without wheezes or rales.  Heart: Regular rate and rhythm. No S3 or S4.  No rubs or gallops appreciated. 2/6 holosystolic murmur along Apex.  Abdomen: Soft, non-tender, non-distended with normoactive bowel sounds. No hepatomegaly. No rebound/guarding. No obvious abdominal masses. Msk:  Strength and tone appear normal for age. No joint deformities or effusions. Extremities: No clubbing or cyanosis. No lower extremity edema.  Distal pedal pulses are 2+ bilaterally. Neuro: Alert and oriented X 3. Moves all extremities spontaneously. No focal deficits noted. Psych:  Responds to questions appropriately with a normal affect. Skin: No rashes or lesions noted  Wt Readings from Last 3 Encounters:  01/29/18 170 lb (77.1 kg)  01/17/18 172 lb 9.6 oz (78.3 kg)  05/22/17 171 lb (77.6 kg)     Studies/Labs Reviewed:   EKG:  EKG is ordered today.  The ekg ordered today demonstrates normal sinus rhythm, heart rate 90, with inferior Q-waves which are now more prominent when compared to prior tracings.  Recent Labs: No results found for requested labs within last 8760 hours.   Lipid Panel No results found for: CHOL, TRIG, HDL, CHOLHDL, VLDL, LDLCALC, LDLDIRECT  Additional studies/ records that were reviewed today  include:   Echocardiogram: 03/2013   Carotid Dopplers: 04/2016 Heterogeneous plaque, bilaterally. Stable 1-30% RICA stenosis. Stable 24-23% LICA stenosis. Normal subclavian arteries, bilaterally. Patent vertebral arteries with antegrade flow.  Assessment:    1. Dyspnea on exertion   2. Left arm pain   3. TIA (transient ischemic attack)   4. Bilateral carotid artery stenosis   5. Partial retinal artery occlusion, unspecified laterality   6. Essential hypertension      Plan:   In order of problems listed above:  1. Left Arm Pain/ Dyspnea on Exertion - She reports symptoms of left arm pain over the past few months which can occur at rest or with activity.  Symptoms have improved with massage but also improved with the use of sublingual nitroglycerin. She is now starting to notice this more frequently with exertion and has been experiencing associated dyspnea. - She has no known personal history of CAD but does have multiple cardiac risk factors including HTN, HLD, Type II DM, and family history of CAD in her mother.  - Will plan to obtain a Lexiscan Myoview for initial ischemic evaluation. If high risk and a cardiac catheterization is warranted, will review with the Coumadin clinic as she would likely require bridging given her multiple recurrent TIA's.  2. Recurrent TIA's - She has a history of a PFO as noted on prior echocardiogram and has remained on Coumadin for anticoagulation. She denies any evidence of active bleeding and INR was 2.5 when checked on 01/01/2018.  3. Carotid Artery Stenosis/Partial Retinal Artery Occlusion - She has known carotid artery stenosis with Dopplers in 04/2016 showing 1-30% RICA stenosis and 53-61% LICA stenosis. She was also recently evaluated by a retinal specialist and diagnosed with a partial retinal artery occlusion and carotid dopplers were recommended for further evaluation at that time. - Will place an order for repeat carotid dopplers. - She  has continued on ASA and Coumadin. Has been intolerant to multiple  statins and even Zetia. Not interested in additional cholesterol medications at this time.  4. HTN - BP is elevated to 164/80 on initial check, slightly improved to 156/80 on recheck. Will titrate Amlodipine from 5 mg daily to 10 mg daily. Continue Losartan 100 mg daily. I have asked her to continue to follow blood pressure in the ambulatory setting.   Medication Adjustments/Labs and Tests Ordered: Current medicines are reviewed at length with the patient today.  Concerns regarding medicines are outlined above.  Medication changes, Labs and Tests ordered today are listed in the Patient Instructions below. Patient Instructions  Medication Instructions:  Your physician has recommended you make the following change in your medication:  Increase Norvasc to 59m Daily    Labwork: NONE   Testing/Procedures: Your physician has requested that you have a carotid duplex. This test is an ultrasound of the carotid arteries in your neck. It looks at blood flow through these arteries that supply the brain with blood. Allow one hour for this exam. There are no restrictions or special instructions.  Your physician has requested that you have a lexiscan myoview. For further information please visit [email or web address] Please follow instruction sheet, as given.  Follow-Up: Your physician recommends that you schedule a follow-up appointment in: 2 Months with Dr. KBronson Ing   Any Other Special Instructions Will Be Listed Below (If Applicable).  If you need a refill on your cardiac medications before your next appointment, please call your pharmacy.  Thank you for choosing [facility]    Signed, BErma Heritage PA-C  01/29/2018 5:16 PM    [place]el[place]S[address]RC[place] Park  [id number]: [phone]

## 2018-01-28 NOTE — H&P (View-Only) (Signed)
Cardiology Office Note    Date:  01/29/2018   ID:  [name], DOB 02-Jul-1927, MRN [id number]  PCP:  Patient, No Pcp Per  Cardiologist: Ana Sable, MD    Chief Complaint  Patient presents with  . Follow-up    recent left arm pain and dyspnea on exertion    History of Present Illness:    [name] is a 84 y.o. female with past medical history of carotid artery stenosis, HTN, HLD, Type II DM, and recurrent TIA's (with PFO noted on prior echocardiogram, on Coumadin for anticoagulation) who presents to the office today for evaluation of left arm pain.  She was last examined by Dr. Bronson Ing in 05/2017 and denied any recent chest pain or dyspnea on exertion at that time. Was continued on her current medication regimen. She presented to the office on 01/01/2018 for a routine Coumadin check and reported having left arm pain over the past few weeks which was relieved with sublingual nitroglycerin. Therefore, close follow-up was recommended for further evaluation.  In talking with the patient today, she reports having episodes of left arm pain over the past few months which can occur at rest or with activity. Reports her symptoms are sometimes worse at night and she would massage her left arm with improvement. Symptoms did not resolve a few nights ago with massage and she took sublingual nitroglycerin with resolution of her symptoms. She denies any specific chest discomfort but has noticed worsening dyspnea on exertion over the past several weeks as well. No recent orthopnea, PND, or lower extremity edema.  She has no known history of CAD but reports her mother died of heart attack and she does have multiple cardiac risk factors including HTN, HLD, and Type II DM. Her cholesterol has been followed by her PCP and she has been intolerant to multiple statins and was tried on Zetia over the past few months but experienced myalgias with this as well   Past Medical History:  Diagnosis  Date  . Allergic rhinitis   . Colonic polyp   . Diabetes mellitus   . GERD (gastroesophageal reflux disease)   . Hyperlipidemia   . Hypertension   . Nonalcoholic hepatosteatosis     Past Surgical History:  Procedure Laterality Date  . ABDOMINAL HYSTERECTOMY    . APPENDECTOMY    . CARDIAC CATHETERIZATION  2001   Nonobstructive CAD    Current Medications: Outpatient Medications Prior to Visit  Medication Sig Dispense Refill  . aspirin EC 81 MG tablet Take 1 tablet (81 mg total) by mouth daily. 90 tablet 3  . carboxymethylcellulose (REFRESH) 1 % ophthalmic solution Place 1 drop into both eyes as needed.     . docusate sodium (COLACE) 100 MG capsule Take 100 mg by mouth 2 (two) times daily.    . fexofenadine (ALLEGRA) 180 MG tablet Take 180 mg by mouth daily.      . fluticasone (FLONASE) 50 MCG/ACT nasal spray Place 1 spray into both nostrils daily.    Marland Kitchen glipiZIDE (GLUCOTROL XL) 2.5 MG 24 hr tablet Take 2.5 mg by mouth daily.    Marland Kitchen losartan (COZAAR) 100 MG tablet Take 100 mg by mouth daily.    . metFORMIN (GLUCOPHAGE-XR) 500 MG 24 hr tablet Take 500 mg by mouth daily with breakfast.      . Multiple Vitamin (MULTIVITAMIN) tablet Take 1 tablet by mouth daily.      . nitroGLYCERIN (NITROSTAT) 0.4 MG SL tablet Place 0.4 mg under the  tongue every 5 (five) minutes as needed for chest pain.    Marland Kitchen omeprazole (PRILOSEC) 20 MG capsule Take 20 mg by mouth daily.      . ursodiol (ACTIGALL) 300 MG capsule Take 300 mg by mouth 2 (two) times daily.     . vitamin E (VITAMIN E) 400 UNIT capsule Take 400 Units by mouth daily.     Marland Kitchen amLODipine (NORVASC) 5 MG tablet Take 1 tablet (5 mg total) by mouth daily. 90 tablet 3  . ezetimibe (ZETIA) 10 MG tablet Take 10 mg by mouth daily.    Marland Kitchen warfarin (JANTOVEN) 5 MG tablet Take 1 tablet (5 mg total) by mouth daily. 90 tablet 3   No facility-administered medications prior to visit.      Allergies:   Ivp dye [iodinated diagnostic agents]; Lipitor [atorvastatin  calcium]; Penicillins; Sulfonamide derivatives; and Zetia [ezetimibe]   Social History   Socioeconomic History  . Marital status: Married    Spouse name: Not on file  . Number of children: Not on file  . Years of education: Not on file  . Highest education level: Not on file  Occupational History  . Not on file  Social Needs  . Financial resource strain: Not on file  . Food insecurity:    Worry: Not on file    Inability: Not on file  . Transportation needs:    Medical: Not on file    Non-medical: Not on file  Tobacco Use  . Smoking status: Former Smoker    Packs/day: 2.00    Years: 40.00    Pack years: 80.00    Types: Cigarettes    Last attempt to quit: 06/05/1992    Years since quitting: 25.6  . Smokeless tobacco: Former Systems developer    Types: Chew    Quit date: 12/03/1992  . Tobacco comment: used tobacco to help quit smoking  Substance and Sexual Activity  . Alcohol use: No  . Drug use: No  . Sexual activity: Not on file  Lifestyle  . Physical activity:    Days per week: Not on file    Minutes per session: Not on file  . Stress: Not on file  Relationships  . Social connections:    Talks on phone: Not on file    Gets together: Not on file    Attends religious service: Not on file    Active member of club or organization: Not on file    Attends meetings of clubs or organizations: Not on file    Relationship status: Not on file  Other Topics Concern  . Not on file  Social History Narrative  . Not on file     Family History:  The patient's family history includes Heart failure in her mother; Prostate cancer in her brother and father.   Review of Systems:   Please see the history of present illness.     General:  No chills, fever, night sweats or weight changes.  Cardiovascular:  No chest pain, edema, orthopnea, palpitations, paroxysmal nocturnal dyspnea.  Positive for dyspnea on exertion and left arm pain. Dermatological: No rash, lesions/masses Respiratory: No cough,  dyspnea Urologic: No hematuria, dysuria Abdominal:   No nausea, vomiting, diarrhea, bright red blood per rectum, melena, or hematemesis Neurologic:  No visual changes, wkns, changes in mental status. All other systems reviewed and are otherwise negative except as noted above.   Physical Exam:    VS:  BP (!) 164/80 (BP Location: Right Arm)   Pulse 89  Ht 5' 4"  (1.626 m)   Wt 170 lb (77.1 kg)   SpO2 94%   BMI 29.18 kg/m    General: Well developed, elderly Caucasian female appearing in no acute distress. Head: Normocephalic, atraumatic, sclera non-icteric, no xanthomas, nares are without discharge.  Neck: No carotid bruits. JVD not elevated.  Lungs: Respirations regular and unlabored, without wheezes or rales.  Heart: Regular rate and rhythm. No S3 or S4.  No rubs or gallops appreciated. 2/6 holosystolic murmur along Apex.  Abdomen: Soft, non-tender, non-distended with normoactive bowel sounds. No hepatomegaly. No rebound/guarding. No obvious abdominal masses. Msk:  Strength and tone appear normal for age. No joint deformities or effusions. Extremities: No clubbing or cyanosis. No lower extremity edema.  Distal pedal pulses are 2+ bilaterally. Neuro: Alert and oriented X 3. Moves all extremities spontaneously. No focal deficits noted. Psych:  Responds to questions appropriately with a normal affect. Skin: No rashes or lesions noted  Wt Readings from Last 3 Encounters:  01/29/18 170 lb (77.1 kg)  01/17/18 172 lb 9.6 oz (78.3 kg)  05/22/17 171 lb (77.6 kg)     Studies/Labs Reviewed:   EKG:  EKG is ordered today.  The ekg ordered today demonstrates normal sinus rhythm, heart rate 90, with inferior Q-waves which are now more prominent when compared to prior tracings.  Recent Labs: No results found for requested labs within last 8760 hours.   Lipid Panel No results found for: CHOL, TRIG, HDL, CHOLHDL, VLDL, LDLCALC, LDLDIRECT  Additional studies/ records that were reviewed today  include:   Echocardiogram: 03/2013   Carotid Dopplers: 04/2016 Heterogeneous plaque, bilaterally. Stable 1-30% RICA stenosis. Stable 22-29% LICA stenosis. Normal subclavian arteries, bilaterally. Patent vertebral arteries with antegrade flow.  Assessment:    1. Dyspnea on exertion   2. Left arm pain   3. TIA (transient ischemic attack)   4. Bilateral carotid artery stenosis   5. Partial retinal artery occlusion, unspecified laterality   6. Essential hypertension      Plan:   In order of problems listed above:  1. Left Arm Pain/ Dyspnea on Exertion - She reports symptoms of left arm pain over the past few months which can occur at rest or with activity.  Symptoms have improved with massage but also improved with the use of sublingual nitroglycerin. She is now starting to notice this more frequently with exertion and has been experiencing associated dyspnea. - She has no known personal history of CAD but does have multiple cardiac risk factors including HTN, HLD, Type II DM, and family history of CAD in her mother.  - Will plan to obtain a Lexiscan Myoview for initial ischemic evaluation. If high risk and a cardiac catheterization is warranted, will review with the Coumadin clinic as she would likely require bridging given her multiple recurrent TIA's.  2. Recurrent TIA's - She has a history of a PFO as noted on prior echocardiogram and has remained on Coumadin for anticoagulation. She denies any evidence of active bleeding and INR was 2.5 when checked on 01/01/2018.  3. Carotid Artery Stenosis/Partial Retinal Artery Occlusion - She has known carotid artery stenosis with Dopplers in 04/2016 showing 1-30% RICA stenosis and 79-89% LICA stenosis. She was also recently evaluated by a retinal specialist and diagnosed with a partial retinal artery occlusion and carotid dopplers were recommended for further evaluation at that time. - Will place an order for repeat carotid dopplers. - She  has continued on ASA and Coumadin. Has been intolerant to multiple  statins and even Zetia. Not interested in additional cholesterol medications at this time.  4. HTN - BP is elevated to 164/80 on initial check, slightly improved to 156/80 on recheck. Will titrate Amlodipine from 5 mg daily to 10 mg daily. Continue Losartan 100 mg daily. I have asked her to continue to follow blood pressure in the ambulatory setting.   Medication Adjustments/Labs and Tests Ordered: Current medicines are reviewed at length with the patient today.  Concerns regarding medicines are outlined above.  Medication changes, Labs and Tests ordered today are listed in the Patient Instructions below. Patient Instructions  Medication Instructions:  Your physician has recommended you make the following change in your medication:  Increase Norvasc to 6m Daily    Labwork: NONE   Testing/Procedures: Your physician has requested that you have a carotid duplex. This test is an ultrasound of the carotid arteries in your neck. It looks at blood flow through these arteries that supply the brain with blood. Allow one hour for this exam. There are no restrictions or special instructions.  Your physician has requested that you have a lexiscan myoview. For further information please visit [email or web address] Please follow instruction sheet, as given.  Follow-Up: Your physician recommends that you schedule a follow-up appointment in: 2 Months with Dr. KBronson Ing   Any Other Special Instructions Will Be Listed Below (If Applicable).  If you need a refill on your cardiac medications before your next appointment, please call your pharmacy.  Thank you for choosing [facility]    Signed, BErma Heritage PA-C  01/29/2018 5:16 PM    [place]os [address] [place] [id number]: [phone]

## 2018-01-29 ENCOUNTER — Encounter: Payer: Self-pay | Admitting: Student

## 2018-01-29 ENCOUNTER — Encounter: Payer: Self-pay | Admitting: *Deleted

## 2018-01-29 ENCOUNTER — Ambulatory Visit (INDEPENDENT_AMBULATORY_CARE_PROVIDER_SITE_OTHER): Payer: Medicare Other | Admitting: Student

## 2018-01-29 VITALS — BP 164/80 | HR 89 | Ht 64.0 in | Wt 170.0 lb

## 2018-01-29 DIAGNOSIS — R0609 Other forms of dyspnea: Secondary | ICD-10-CM

## 2018-01-29 DIAGNOSIS — M79602 Pain in left arm: Secondary | ICD-10-CM

## 2018-01-29 DIAGNOSIS — H34219 Partial retinal artery occlusion, unspecified eye: Secondary | ICD-10-CM

## 2018-01-29 DIAGNOSIS — I6523 Occlusion and stenosis of bilateral carotid arteries: Secondary | ICD-10-CM

## 2018-01-29 DIAGNOSIS — G459 Transient cerebral ischemic attack, unspecified: Secondary | ICD-10-CM

## 2018-01-29 DIAGNOSIS — I10 Essential (primary) hypertension: Secondary | ICD-10-CM

## 2018-01-29 MED ORDER — AMLODIPINE BESYLATE 10 MG PO TABS: 10.0000 mg | ORAL_TABLET | Freq: Every day | ORAL | 3 refills | Status: DC

## 2018-01-29 MED ORDER — WARFARIN SODIUM 5 MG PO TABS: 5.0000 mg | ORAL_TABLET | Freq: Every day | ORAL | 3 refills | Status: DC

## 2018-01-29 NOTE — Patient Instructions (Signed)
Medication Instructions:  Your physician has recommended you make the following change in your medication:  Increase Norvasc to 7m Daily    Labwork: NONE   Testing/Procedures: Your physician has requested that you have a carotid duplex. This test is an ultrasound of the carotid arteries in your neck. It looks at blood flow through these arteries that supply the brain with blood. Allow one hour for this exam. There are no restrictions or special instructions.  Your physician has requested that you have a lexiscan myoview. For further information please visit [email or web address] Please follow instruction sheet, as given.    Follow-Up: Your physician recommends that you schedule a follow-up appointment in: 2 Months with Dr. KBronson Ing    Any Other Special Instructions Will Be Listed Below (If Applicable).     If you need a refill on your cardiac medications before your next appointment, please call your pharmacy.  [facility] you for choosing [facility]

## 2018-02-05 ENCOUNTER — Encounter (HOSPITAL_COMMUNITY)
Admission: RE | Admit: 2018-02-05 | Discharge: 2018-02-05 | Disposition: A | Discharge status: Home / Self Care | Payer: Medicare Other | Source: Ambulatory Visit | Attending: Student | Admitting: Student

## 2018-02-05 ENCOUNTER — Encounter (HOSPITAL_COMMUNITY): Payer: Self-pay

## 2018-02-05 ENCOUNTER — Ambulatory Visit (HOSPITAL_BASED_OUTPATIENT_CLINIC_OR_DEPARTMENT_OTHER)
Admission: RE | Admit: 2018-02-05 | Discharge: 2018-02-05 | Disposition: A | Discharge status: Home / Self Care | Payer: Medicare Other | Source: Ambulatory Visit | Attending: Student | Admitting: Student

## 2018-02-05 ENCOUNTER — Ambulatory Visit (HOSPITAL_COMMUNITY)
Admission: RE | Admit: 2018-02-05 | Discharge: 2018-02-05 | Disposition: A | Discharge status: Home / Self Care | Payer: Medicare Other | Source: Ambulatory Visit | Attending: Student | Admitting: Student

## 2018-02-05 DIAGNOSIS — I6523 Occlusion and stenosis of bilateral carotid arteries: Secondary | ICD-10-CM | POA: Insufficient documentation

## 2018-02-05 DIAGNOSIS — R0609 Other forms of dyspnea: Secondary | ICD-10-CM

## 2018-02-05 DIAGNOSIS — I519 Heart disease, unspecified: Secondary | ICD-10-CM | POA: Diagnosis not present

## 2018-02-05 DIAGNOSIS — M79602 Pain in left arm: Secondary | ICD-10-CM

## 2018-02-05 LAB — NM MYOCAR MULTI W/SPECT W/WALL MOTION / EF
CHL CUP NUCLEAR SRS: 15
CHL CUP NUCLEAR SSS: 19
CSEPPHR: 106 {beats}/min
LV sys vol: 38 mL
LVDIAVOL: 83 mL (ref 46–106)
NUC STRESS TID: 1.16
RATE: 0.39
Rest HR: 72 {beats}/min
SDS: 4

## 2018-02-05 MED ORDER — TECHNETIUM TC 99M TETROFOSMIN IV KIT
30.0000 | PACK | Freq: Once | INTRAVENOUS | Status: AC | PRN
  Administered 2018-02-05: 23 via INTRAVENOUS

## 2018-02-05 MED ORDER — REGADENOSON 0.4 MG/5ML IV SOLN
INTRAVENOUS | Status: AC
  Administered 2018-02-05: 0.4 mg via INTRAVENOUS
  Filled 2018-02-05: qty 5

## 2018-02-05 MED ORDER — SODIUM CHLORIDE 0.9% FLUSH
INTRAVENOUS | Status: AC
  Administered 2018-02-05: 10 mL via INTRAVENOUS
  Filled 2018-02-05: qty 10

## 2018-02-05 MED ORDER — TECHNETIUM TC 99M TETROFOSMIN IV KIT
10.0000 | PACK | Freq: Once | INTRAVENOUS | Status: AC | PRN
  Administered 2018-02-05: 8.6 via INTRAVENOUS

## 2018-02-07 ENCOUNTER — Ambulatory Visit (INDEPENDENT_AMBULATORY_CARE_PROVIDER_SITE_OTHER): Payer: Medicare Other | Admitting: *Deleted

## 2018-02-07 DIAGNOSIS — G459 Transient cerebral ischemic attack, unspecified: Secondary | ICD-10-CM | POA: Diagnosis not present

## 2018-02-07 DIAGNOSIS — Z7901 Long term (current) use of anticoagulants: Secondary | ICD-10-CM | POA: Diagnosis not present

## 2018-02-07 DIAGNOSIS — Z5181 Encounter for therapeutic drug level monitoring: Secondary | ICD-10-CM

## 2018-02-07 LAB — POCT INR: INR: 2 (ref 2.0–3.0)

## 2018-02-07 NOTE — Patient Instructions (Signed)
Continue coumadin 1 tablet daily except 1 1/2 tablets on Saturdays Recheck in 4 weeks

## 2018-02-12 ENCOUNTER — Telehealth: Payer: Self-pay | Admitting: Student

## 2018-02-12 DIAGNOSIS — R0609 Other forms of dyspnea: Principal | ICD-10-CM

## 2018-02-12 NOTE — Telephone Encounter (Addendum)
   Called and reviewed stress test results and recommendations for a cardiac catheterization as discussed with Dr. Bronson Ing. Offered appointment to come into the office to discuss but she declined as is aware of the procedure and this was reviewed during the phone call today.   The patient understands that risks include but are not limited to stroke (1 in 1000), death (1 in 50), kidney failure [usually temporary] (1 in 500), bleeding (1 in 200), allergic reaction [possibly serious] (1 in 200). Reports having an allergy to IVP dye 10+ years ago and says she has undergone CT studies since but is unsure if she has received contrast.   Cardiac Catheterization is scheduled for 02/26/2018 at 9:00 AM with Dr. [place]. Arrival time of 7:00AM. Will need to hold Glipizide and Metformin the morning of the procedure. Will message Edrick Oh today to make her aware of the procedure date as the patient will require Lovenox bridging.   Alda Berthold - Please print a letter detailing the procedure instructions and either mail to the patient or have available at the time of her visit with Lattie Haw. She wishes to have pre-procedure labs obtained here at [facility] and orders will need to be entered. She does have an allergy to IVP dye and I verified with the catheterization lab that she WILL need to be treated for contrast allergy, therefore prescriptions for pre-treatment will need to be sent in and listed on her instruction sheet.     Signed, Erma Heritage, PA-C 02/12/2018, 10:23 AM Pager: [phone]

## 2018-02-13 ENCOUNTER — Encounter: Payer: Self-pay | Admitting: *Deleted

## 2018-02-13 NOTE — Telephone Encounter (Signed)
Orders placed and Rx called into pharmacy.

## 2018-02-19 ENCOUNTER — Ambulatory Visit (INDEPENDENT_AMBULATORY_CARE_PROVIDER_SITE_OTHER): Payer: Medicare Other | Admitting: *Deleted

## 2018-02-19 DIAGNOSIS — Z7901 Long term (current) use of anticoagulants: Secondary | ICD-10-CM | POA: Diagnosis not present

## 2018-02-19 DIAGNOSIS — Z5181 Encounter for therapeutic drug level monitoring: Secondary | ICD-10-CM | POA: Diagnosis not present

## 2018-02-19 DIAGNOSIS — G459 Transient cerebral ischemic attack, unspecified: Secondary | ICD-10-CM | POA: Diagnosis not present

## 2018-02-19 LAB — POCT INR: INR: 1.9 — AB (ref 2.0–3.0)

## 2018-02-19 NOTE — Patient Instructions (Addendum)
Scheduled for cardiac cath on 9/24  Labs: 9/18   SCr 0.69  CrCl 69.82   Hgb 13.6  Hct 39.5  Plts 199   Wt. 77.1kg   9/19  Last dose of coumadin (28m) 9/20  No lovenox or coumadin 9/21  Lovenox 1236msq at 8:30am 9/22   Lovenox 12020mq at 8:30am 9/23   Lovenox 120m70m at 8:30am 9/24  No lovenox-------heart cath------coumadin 7.5mg 55m9/25   Lovenox 120mg 5mt 8:30am and coumadin 7.5mg pm42m26   Lovenox 120mg sq59m8:30am & coumadin 5mg pm 960m   Lovenox 120mg sq a94m30am  & coumadin 5mg pm 9/255m Lovenox 120mg sq at 61mam & coumadin 5mg pm 9/29 60movenox 120mg sq at 8:56m & coumadin 5mg pm 9/30   24menox 120mg sq at 8:3012m coumadin 5mg pm 10/01   L32mnox 120mg sq at 8:30am80m---INR appt @ 1:45pm

## 2018-02-20 ENCOUNTER — Other Ambulatory Visit: Payer: Self-pay | Admitting: *Deleted

## 2018-02-20 ENCOUNTER — Telehealth: Payer: Self-pay | Admitting: *Deleted

## 2018-02-20 ENCOUNTER — Telehealth: Payer: Self-pay

## 2018-02-20 LAB — BASIC METABOLIC PANEL
BUN: 17 mg/dL (ref 7–25)
CHLORIDE: 100 mmol/L (ref 98–110)
CO2: 29 mmol/L (ref 20–32)
CREATININE: 0.69 mg/dL (ref 0.60–0.88)
Calcium: 10.1 mg/dL (ref 8.6–10.4)
Glucose, Bld: 170 mg/dL — ABNORMAL HIGH (ref 65–139)
POTASSIUM: 4.2 mmol/L (ref 3.5–5.3)
Sodium: 138 mmol/L (ref 135–146)

## 2018-02-20 LAB — CBC WITH DIFFERENTIAL/PLATELET
BASOS PCT: 0.6 %
Basophils Absolute: 50 cells/uL (ref 0–200)
EOS ABS: 108 {cells}/uL (ref 15–500)
EOS PCT: 1.3 %
HEMATOCRIT: 39.5 % (ref 35.0–45.0)
HEMOGLOBIN: 13.6 g/dL (ref 11.7–15.5)
Lymphs Abs: 2034 cells/uL (ref 850–3900)
MCH: 31.1 pg (ref 27.0–33.0)
MCHC: 34.4 g/dL (ref 32.0–36.0)
MCV: 90.2 fL (ref 80.0–100.0)
MONOS PCT: 10.7 %
MPV: 11.6 fL (ref 7.5–12.5)
Neutro Abs: 5221 cells/uL (ref 1500–7800)
Neutrophils Relative %: 62.9 %
Platelets: 199 10*3/uL (ref 140–400)
RBC: 4.38 10*6/uL (ref 3.80–5.10)
RDW: 11.9 % (ref 11.0–15.0)
TOTAL LYMPHOCYTE: 24.5 %
WBC: 8.3 10*3/uL (ref 3.8–10.8)
WBCMIX: 888 {cells}/uL (ref 200–950)

## 2018-02-20 MED ORDER — ENOXAPARIN SODIUM 120 MG/0.8ML ~~LOC~~ SOLN: 120.0000 mg | SUBCUTANEOUS | 0 refills | Status: DC

## 2018-02-20 NOTE — Telephone Encounter (Signed)
Celled pt. No answer. Left message for pt to return call.

## 2018-02-20 NOTE — Telephone Encounter (Signed)
Wants to know if she can take Benadryl.. Wants to make sure it wont mess w/ her coumadin

## 2018-02-20 NOTE — Telephone Encounter (Signed)
Spoke with pt.  Has several whelps on her arm from bug bite last night (she thinks).  Wants to know if she can take Benadryl with coumadin.  Told her it would not interfere with INR level nad she could use benadryl cream as well.

## 2018-02-20 NOTE — Telephone Encounter (Signed)
-----   Message from Erma Heritage, [place] sent at 02/20/2018  4:20 PM EDT ----- Please let the patient know that her pre-cardiac catheterization labs look stable. No evidence of anemia and platelet counts are within normal limits. Kidney function and electrolytes stable. Thank you.

## 2018-02-25 ENCOUNTER — Telehealth: Payer: Self-pay | Admitting: *Deleted

## 2018-02-25 NOTE — Telephone Encounter (Signed)
Pt contacted pre-catheterization scheduled at [facility] for: Tuesday February 26, 2018 9 AM Verified arrival time and place: [facility] En[facility]ance A at: 7 AM  No solid food after midnight prior to cath, clear liquids until 5 AM day of procedure.  Hold: Coumadin-02/22/18 until post procedure Metformin-day of procedure and 48 hours post procedure. Glipizide-AM of procedure.  Confirmed contrast allergy and reviewed instructions for 13 hour Prednisone and Benadryl Prep: Prednisone 50 mg 02/25/18 8 PM Prednisone 50 mg 02/26/18 2 AM Prednisone 50 mg and Benadryl 50 mg prior to leaving for hospital AM of procedure 02/26/18.  Except hold medications AM meds can be  taken pre-cath with sip of water including: ASA 81 mg Prednisone 50 mg Benadryl 50 mg  Confirmed patient has responsible person to drive home post procedure and for 24 hours after you arrive home: yes

## 2018-02-26 ENCOUNTER — Encounter (HOSPITAL_COMMUNITY)
Admission: RE | Disposition: A | Discharge status: Home / Self Care | Payer: Self-pay | Source: Ambulatory Visit | Attending: Cardiology

## 2018-02-26 ENCOUNTER — Other Ambulatory Visit: Payer: Self-pay

## 2018-02-26 ENCOUNTER — Encounter (HOSPITAL_COMMUNITY): Payer: Self-pay | Admitting: Cardiology

## 2018-02-26 ENCOUNTER — Ambulatory Visit (HOSPITAL_COMMUNITY)
Admission: RE | Admit: 2018-02-26 | Discharge: 2018-02-27 | Disposition: A | Discharge status: Home / Self Care | Payer: Medicare Other | Source: Ambulatory Visit | Attending: Cardiology | Admitting: Cardiology

## 2018-02-26 DIAGNOSIS — Z882 Allergy status to sulfonamides status: Secondary | ICD-10-CM | POA: Insufficient documentation

## 2018-02-26 DIAGNOSIS — Z86010 Personal history of colonic polyps: Secondary | ICD-10-CM | POA: Diagnosis not present

## 2018-02-26 DIAGNOSIS — H34219 Partial retinal artery occlusion, unspecified eye: Secondary | ICD-10-CM | POA: Diagnosis not present

## 2018-02-26 DIAGNOSIS — E119 Type 2 diabetes mellitus without complications: Secondary | ICD-10-CM | POA: Insufficient documentation

## 2018-02-26 DIAGNOSIS — Z7984 Long term (current) use of oral hypoglycemic drugs: Secondary | ICD-10-CM | POA: Diagnosis not present

## 2018-02-26 DIAGNOSIS — I200 Unstable angina: Secondary | ICD-10-CM | POA: Diagnosis present

## 2018-02-26 DIAGNOSIS — M79602 Pain in left arm: Secondary | ICD-10-CM | POA: Insufficient documentation

## 2018-02-26 DIAGNOSIS — Z880 Allergy status to penicillin: Secondary | ICD-10-CM | POA: Insufficient documentation

## 2018-02-26 DIAGNOSIS — Z90710 Acquired absence of both cervix and uterus: Secondary | ICD-10-CM | POA: Insufficient documentation

## 2018-02-26 DIAGNOSIS — Z7901 Long term (current) use of anticoagulants: Secondary | ICD-10-CM

## 2018-02-26 DIAGNOSIS — I25110 Atherosclerotic heart disease of native coronary artery with unstable angina pectoris: Secondary | ICD-10-CM | POA: Diagnosis not present

## 2018-02-26 DIAGNOSIS — Z8249 Family history of ischemic heart disease and other diseases of the circulatory system: Secondary | ICD-10-CM | POA: Diagnosis not present

## 2018-02-26 DIAGNOSIS — Z9889 Other specified postprocedural states: Secondary | ICD-10-CM | POA: Insufficient documentation

## 2018-02-26 DIAGNOSIS — Z888 Allergy status to other drugs, medicaments and biological substances status: Secondary | ICD-10-CM | POA: Insufficient documentation

## 2018-02-26 DIAGNOSIS — Z8673 Personal history of transient ischemic attack (TIA), and cerebral infarction without residual deficits: Secondary | ICD-10-CM | POA: Insufficient documentation

## 2018-02-26 DIAGNOSIS — Q211 Atrial septal defect: Secondary | ICD-10-CM | POA: Insufficient documentation

## 2018-02-26 DIAGNOSIS — Z79899 Other long term (current) drug therapy: Secondary | ICD-10-CM | POA: Insufficient documentation

## 2018-02-26 DIAGNOSIS — Z91041 Radiographic dye allergy status: Secondary | ICD-10-CM | POA: Insufficient documentation

## 2018-02-26 DIAGNOSIS — E785 Hyperlipidemia, unspecified: Secondary | ICD-10-CM | POA: Diagnosis not present

## 2018-02-26 DIAGNOSIS — Z87891 Personal history of nicotine dependence: Secondary | ICD-10-CM | POA: Diagnosis not present

## 2018-02-26 DIAGNOSIS — Z7982 Long term (current) use of aspirin: Secondary | ICD-10-CM | POA: Insufficient documentation

## 2018-02-26 DIAGNOSIS — I10 Essential (primary) hypertension: Secondary | ICD-10-CM | POA: Diagnosis present

## 2018-02-26 DIAGNOSIS — K219 Gastro-esophageal reflux disease without esophagitis: Secondary | ICD-10-CM | POA: Diagnosis not present

## 2018-02-26 DIAGNOSIS — I6523 Occlusion and stenosis of bilateral carotid arteries: Secondary | ICD-10-CM | POA: Diagnosis not present

## 2018-02-26 DIAGNOSIS — Z955 Presence of coronary angioplasty implant and graft: Secondary | ICD-10-CM | POA: Insufficient documentation

## 2018-02-26 DIAGNOSIS — Z7951 Long term (current) use of inhaled steroids: Secondary | ICD-10-CM | POA: Diagnosis not present

## 2018-02-26 HISTORY — DX: Type 2 diabetes mellitus without complications: E11.9

## 2018-02-26 HISTORY — DX: Unspecified osteoarthritis, unspecified site: M19.90

## 2018-02-26 HISTORY — PX: LEFT HEART CATH AND CORONARY ANGIOGRAPHY: CATH118249

## 2018-02-26 HISTORY — PX: CORONARY STENT INTERVENTION: CATH118234

## 2018-02-26 HISTORY — DX: Transient cerebral ischemic attack, unspecified: G45.9

## 2018-02-26 HISTORY — DX: Personal history of other medical treatment: Z92.89

## 2018-02-26 HISTORY — DX: Atherosclerotic heart disease of native coronary artery without angina pectoris: I25.10

## 2018-02-26 LAB — GLUCOSE, CAPILLARY
GLUCOSE-CAPILLARY: 187 mg/dL — AB (ref 70–99)
GLUCOSE-CAPILLARY: 201 mg/dL — AB (ref 70–99)
GLUCOSE-CAPILLARY: 172 mg/dL — AB (ref 70–99)
Glucose-Capillary: 190 mg/dL — ABNORMAL HIGH (ref 70–99)

## 2018-02-26 LAB — POCT ACTIVATED CLOTTING TIME
ACTIVATED CLOTTING TIME: 356 s
Activated Clotting Time: 268 seconds

## 2018-02-26 LAB — PROTIME-INR
INR: 0.98
Prothrombin Time: 12.9 seconds (ref 11.4–15.2)

## 2018-02-26 SURGERY — LEFT HEART CATH AND CORONARY ANGIOGRAPHY
Anesthesia: LOCAL

## 2018-02-26 MED ORDER — WARFARIN SODIUM 7.5 MG PO TABS
7.5000 mg | ORAL_TABLET | Freq: Once | ORAL | Status: AC
  Administered 2018-02-26: 7.5 mg via ORAL
  Filled 2018-02-26: qty 1

## 2018-02-26 MED ORDER — LIDOCAINE HCL (PF) 1 % IJ SOLN
INTRAMUSCULAR | Status: AC
  Filled 2018-02-26: qty 30

## 2018-02-26 MED ORDER — ANGIOPLASTY BOOK
Freq: Once | Status: AC
  Administered 2018-02-27
  Filled 2018-02-26: qty 1

## 2018-02-26 MED ORDER — SODIUM CHLORIDE 0.9 % WEIGHT BASED INFUSION: 1.0000 mL/kg/h | INTRAVENOUS | Status: DC

## 2018-02-26 MED ORDER — CLOPIDOGREL BISULFATE 75 MG PO TABS
75.0000 mg | ORAL_TABLET | Freq: Every day | ORAL | Status: DC
  Administered 2018-02-27: 10:00:00 75 mg via ORAL
  Filled 2018-02-26: qty 1

## 2018-02-26 MED ORDER — SODIUM CHLORIDE 0.9% FLUSH: 3.0000 mL | Freq: Two times a day (BID) | INTRAVENOUS | Status: DC

## 2018-02-26 MED ORDER — FLUTICASONE PROPIONATE 50 MCG/ACT NA SUSP
2.0000 | Freq: Every day | NASAL | Status: DC
  Administered 2018-02-26 – 2018-02-27 (×2): 2 via NASAL
  Filled 2018-02-26: qty 16

## 2018-02-26 MED ORDER — FENTANYL CITRATE (PF) 100 MCG/2ML IJ SOLN
INTRAMUSCULAR | Status: AC
  Filled 2018-02-26: qty 2

## 2018-02-26 MED ORDER — HEPARIN (PORCINE) IN NACL 1000-0.9 UT/500ML-% IV SOLN
INTRAVENOUS | Status: DC | PRN
  Administered 2018-02-26 (×2): 500 mL

## 2018-02-26 MED ORDER — IOHEXOL 350 MG/ML SOLN
INTRAVENOUS | Status: DC | PRN
  Administered 2018-02-26: 180 mL via INTRAVENOUS

## 2018-02-26 MED ORDER — ASPIRIN EC 81 MG PO TBEC
81.0000 mg | DELAYED_RELEASE_TABLET | Freq: Every day | ORAL | Status: DC
  Administered 2018-02-27: 11:00:00 81 mg via ORAL
  Filled 2018-02-26: qty 1

## 2018-02-26 MED ORDER — NITROGLYCERIN 1 MG/10 ML FOR IR/CATH LAB
INTRA_ARTERIAL | Status: DC | PRN
  Administered 2018-02-26: 200 ug via INTRACORONARY

## 2018-02-26 MED ORDER — ACETAMINOPHEN 325 MG PO TABS: 650.0000 mg | ORAL_TABLET | ORAL | Status: DC | PRN

## 2018-02-26 MED ORDER — CLOPIDOGREL BISULFATE 300 MG PO TABS
ORAL_TABLET | ORAL | Status: AC
  Filled 2018-02-26: qty 2

## 2018-02-26 MED ORDER — WARFARIN - PHARMACIST DOSING INPATIENT
Freq: Every day | Status: DC
  Administered 2018-02-26: 18:00:00

## 2018-02-26 MED ORDER — ONDANSETRON HCL 4 MG/2ML IJ SOLN: 4.0000 mg | Freq: Four times a day (QID) | INTRAMUSCULAR | Status: DC | PRN

## 2018-02-26 MED ORDER — HEPARIN (PORCINE) IN NACL 1000-0.9 UT/500ML-% IV SOLN
INTRAVENOUS | Status: AC
  Filled 2018-02-26: qty 1000

## 2018-02-26 MED ORDER — LOSARTAN POTASSIUM 50 MG PO TABS
100.0000 mg | ORAL_TABLET | Freq: Every day | ORAL | Status: DC
  Administered 2018-02-26 – 2018-02-27 (×2): 100 mg via ORAL
  Filled 2018-02-26 (×3): qty 2

## 2018-02-26 MED ORDER — MIDAZOLAM HCL 2 MG/2ML IJ SOLN
INTRAMUSCULAR | Status: AC
  Filled 2018-02-26: qty 2

## 2018-02-26 MED ORDER — LORATADINE 10 MG PO TABS
10.0000 mg | ORAL_TABLET | Freq: Every day | ORAL | Status: DC
  Administered 2018-02-26 – 2018-02-27 (×2): 10 mg via ORAL
  Filled 2018-02-26 (×2): qty 1

## 2018-02-26 MED ORDER — MIDAZOLAM HCL 2 MG/2ML IJ SOLN
INTRAMUSCULAR | Status: DC | PRN
  Administered 2018-02-26: 1 mg via INTRAVENOUS

## 2018-02-26 MED ORDER — VERAPAMIL HCL 2.5 MG/ML IV SOLN
INTRAVENOUS | Status: AC
  Filled 2018-02-26: qty 2

## 2018-02-26 MED ORDER — SODIUM CHLORIDE 0.9 % WEIGHT BASED INFUSION
3.0000 mL/kg/h | INTRAVENOUS | Status: DC
  Administered 2018-02-26: 3 mL/kg/h via INTRAVENOUS

## 2018-02-26 MED ORDER — INSULIN ASPART 100 UNIT/ML ~~LOC~~ SOLN
0.0000 [IU] | Freq: Three times a day (TID) | SUBCUTANEOUS | Status: DC
  Administered 2018-02-26: 18:00:00 5 [IU] via SUBCUTANEOUS
  Administered 2018-02-26: 13:00:00 3 [IU] via SUBCUTANEOUS
  Administered 2018-02-27: 07:00:00 2 [IU] via SUBCUTANEOUS

## 2018-02-26 MED ORDER — SODIUM CHLORIDE 0.9 % IV SOLN: 250.0000 mL | INTRAVENOUS | Status: DC | PRN

## 2018-02-26 MED ORDER — SODIUM CHLORIDE 0.9% FLUSH: 3.0000 mL | INTRAVENOUS | Status: DC | PRN

## 2018-02-26 MED ORDER — URSODIOL 300 MG PO CAPS
300.0000 mg | ORAL_CAPSULE | Freq: Two times a day (BID) | ORAL | Status: DC
  Administered 2018-02-26 – 2018-02-27 (×3): 300 mg via ORAL
  Filled 2018-02-26 (×3): qty 1

## 2018-02-26 MED ORDER — NITROGLYCERIN 0.4 MG SL SUBL: 0.4000 mg | SUBLINGUAL_TABLET | SUBLINGUAL | Status: DC | PRN

## 2018-02-26 MED ORDER — PANTOPRAZOLE SODIUM 40 MG PO TBEC
40.0000 mg | DELAYED_RELEASE_TABLET | Freq: Every day | ORAL | Status: DC
  Administered 2018-02-26 – 2018-02-27 (×2): 40 mg via ORAL
  Filled 2018-02-26 (×2): qty 1

## 2018-02-26 MED ORDER — CLOPIDOGREL BISULFATE 300 MG PO TABS
ORAL_TABLET | ORAL | Status: DC | PRN
  Administered 2018-02-26: 600 mg via ORAL

## 2018-02-26 MED ORDER — DOCUSATE SODIUM 100 MG PO CAPS: 200.0000 mg | ORAL_CAPSULE | Freq: Two times a day (BID) | ORAL | Status: DC | PRN

## 2018-02-26 MED ORDER — ADULT MULTIVITAMIN W/MINERALS CH
1.0000 | ORAL_TABLET | Freq: Every day | ORAL | Status: DC
  Administered 2018-02-26 – 2018-02-27 (×2): 1 via ORAL
  Filled 2018-02-26: qty 1

## 2018-02-26 MED ORDER — NITROGLYCERIN 1 MG/10 ML FOR IR/CATH LAB
INTRA_ARTERIAL | Status: AC
  Filled 2018-02-26: qty 10

## 2018-02-26 MED ORDER — HYDRALAZINE HCL 20 MG/ML IJ SOLN: 5.0000 mg | INTRAMUSCULAR | Status: AC | PRN

## 2018-02-26 MED ORDER — VITAMIN E 180 MG (400 UNIT) PO CAPS
400.0000 [IU] | ORAL_CAPSULE | Freq: Two times a day (BID) | ORAL | Status: DC
  Administered 2018-02-26 – 2018-02-27 (×3): 400 [IU] via ORAL
  Filled 2018-02-26 (×3): qty 1

## 2018-02-26 MED ORDER — HEPARIN SODIUM (PORCINE) 1000 UNIT/ML IJ SOLN
INTRAMUSCULAR | Status: AC
  Filled 2018-02-26: qty 1

## 2018-02-26 MED ORDER — FENTANYL CITRATE (PF) 100 MCG/2ML IJ SOLN
INTRAMUSCULAR | Status: DC | PRN
  Administered 2018-02-26: 25 ug via INTRAVENOUS

## 2018-02-26 MED ORDER — ASPIRIN 81 MG PO CHEW: 81.0000 mg | CHEWABLE_TABLET | ORAL | Status: DC

## 2018-02-26 MED ORDER — ADULT MULTIVITAMIN W/MINERALS CH
1.0000 | ORAL_TABLET | Freq: Every day | ORAL | Status: DC
  Filled 2018-02-26 (×2): qty 1

## 2018-02-26 MED ORDER — AMLODIPINE BESYLATE 10 MG PO TABS
10.0000 mg | ORAL_TABLET | Freq: Every day | ORAL | Status: DC
  Administered 2018-02-26 – 2018-02-27 (×2): 10 mg via ORAL
  Filled 2018-02-26 (×2): qty 1

## 2018-02-26 MED ORDER — LABETALOL HCL 5 MG/ML IV SOLN
10.0000 mg | INTRAVENOUS | Status: AC | PRN
  Administered 2018-02-26: 12:00:00 10 mg via INTRAVENOUS
  Filled 2018-02-26: qty 4

## 2018-02-26 MED ORDER — REFRESH LIQUIGEL 1 % OP SOLN: 1.0000 [drp] | Freq: Two times a day (BID) | OPHTHALMIC | Status: DC | PRN

## 2018-02-26 MED ORDER — SODIUM CHLORIDE 0.9 % WEIGHT BASED INFUSION
1.0000 mL/kg/h | INTRAVENOUS | Status: AC
  Administered 2018-02-26: 11:00:00 1 mL/kg/h via INTRAVENOUS

## 2018-02-26 MED ORDER — HYDROCODONE-ACETAMINOPHEN 5-325 MG PO TABS: 1.0000 | ORAL_TABLET | Freq: Four times a day (QID) | ORAL | Status: DC | PRN

## 2018-02-26 MED ORDER — VERAPAMIL HCL 2.5 MG/ML IV SOLN
INTRAVENOUS | Status: DC | PRN
  Administered 2018-02-26: 10 mL via INTRA_ARTERIAL

## 2018-02-26 MED ORDER — HEPARIN SODIUM (PORCINE) 1000 UNIT/ML IJ SOLN
INTRAMUSCULAR | Status: DC | PRN
  Administered 2018-02-26: 3000 [IU] via INTRAVENOUS
  Administered 2018-02-26: 2000 [IU] via INTRAVENOUS
  Administered 2018-02-26: 4000 [IU] via INTRAVENOUS

## 2018-02-26 MED ORDER — GLIPIZIDE ER 2.5 MG PO TB24
2.5000 mg | ORAL_TABLET | Freq: Every day | ORAL | Status: DC
  Administered 2018-02-27: 2.5 mg via ORAL
  Filled 2018-02-26: qty 1

## 2018-02-26 MED ORDER — LIDOCAINE HCL (PF) 1 % IJ SOLN
INTRAMUSCULAR | Status: DC | PRN
  Administered 2018-02-26: 2 mL

## 2018-02-26 SURGICAL SUPPLY — 23 items
BALLN SAPPHIRE 2.5X12 (BALLOONS) ×2
BALLN SAPPHIRE ~~LOC~~ 3.25X12 (BALLOONS) ×1 IMPLANT
BALLN SAPPHIRE ~~LOC~~ 3.75X12 (BALLOONS) ×1 IMPLANT
BALLOON SAPPHIRE 2.5X12 (BALLOONS) IMPLANT
CATH INFINITI 5 FR JL3.5 (CATHETERS) ×1 IMPLANT
CATH INFINITI 5FR ANG PIGTAIL (CATHETERS) ×1 IMPLANT
CATH INFINITI JR4 5F (CATHETERS) ×1 IMPLANT
CATH LAUNCHER 5F RADR (CATHETERS) IMPLANT
CATH VISTA GUIDE 6FR XBLAD3.5 (CATHETERS) ×2 IMPLANT
CATHETER LAUNCHER 5F RADR (CATHETERS) ×2
DEVICE RAD COMP TR BAND LRG (VASCULAR PRODUCTS) ×1 IMPLANT
GLIDESHEATH SLEND SS 6F .021 (SHEATH) ×1 IMPLANT
GUIDEWIRE INQWIRE 1.5J.035X260 (WIRE) IMPLANT
INQWIRE 1.5J .035X260CM (WIRE) ×2
KIT ENCORE 26 ADVANTAGE (KITS) ×1 IMPLANT
KIT HEART LEFT (KITS) ×2 IMPLANT
PACK CARDIAC CATHETERIZATION (CUSTOM PROCEDURE TRAY) ×2 IMPLANT
SHEATH PROBE COVER 6X72 (BAG) ×1 IMPLANT
STENT SIERRA 3.00 X 15 MM (Permanent Stent) ×1 IMPLANT
STENT SIERRA 3.50 X 15 MM (Permanent Stent) ×1 IMPLANT
TRANSDUCER W/STOPCOCK (MISCELLANEOUS) ×2 IMPLANT
TUBING CIL FLEX 10 FLL-RA (TUBING) ×2 IMPLANT
WIRE ASAHI PROWATER 180CM (WIRE) ×2 IMPLANT

## 2018-02-26 NOTE — Progress Notes (Signed)
Patient received from cath lab at 1030 with small 2 cm hematoma proximal/lateral to TR band.  Observed for 30 minutes, increased to 3 cm, then held manual pressure for 10" with resolution. Skin marked. Bruising present.

## 2018-02-26 NOTE — Progress Notes (Signed)
ANTICOAGULATION CONSULT NOTE - Initial Consult  Pharmacy Consult for Warfarin Indication: Reccurent TIA  Allergies  Allergen Reactions  . Ivp Dye [Iodinated Diagnostic Agents] Anaphylaxis  . Sulfonamide Derivatives Shortness Of Breath  . Lipitor [Atorvastatin Calcium]     Muscle Aches  . Zetia [Ezetimibe]     Muscle Aches  . Penicillins Rash    As a teenager. Has patient had a PCN reaction causing immediate rash, facial/tongue/throat swelling, SOB or lightheadedness with hypotension: No Has patient had a PCN reaction causing severe rash involving mucus membranes or skin necrosis: No Has patient had a PCN reaction that required hospitalization: No Has patient had a PCN reaction occurring within the last 10 years: No If all of the above answers are "NO", then may proceed with Cephalosporin use.     Patient Measurements: Height: 5' 4"  (162.6 cm) Weight: 163 lb (73.9 kg) IBW/kg (Calculated) : 54.7  Vital Signs: Temp: 97.7 F (36.5 C) (09/24 1040) Temp Source: Oral (09/24 1040) BP: 175/74 (09/24 1040) Pulse Rate: 94 (09/24 1040)  Labs: Recent Labs    02/26/18 0707  LABPROT 12.9  INR 0.98    Estimated Creatinine Clearance: 48.8 mL/min (by C-G formula based on SCr of 0.69 mg/dL).   Medical History: Past Medical History:  Diagnosis Date  . Allergic rhinitis   . Colonic polyp   . Diabetes mellitus   . GERD (gastroesophageal reflux disease)   . Hyperlipidemia   . Hypertension   . Nonalcoholic hepatosteatosis     Assessment: 60 yoF s/p cath requiring anticoagulation due to history of TIAs and PFO noted on prior echocardiogram. PTA patient was taking warfarin 5 mg daily, except 7.5 mg on Saturdays. Patient was told to hold warfarin beginning 9/20 for planned cath. She was initiated on a Lovenox bridge 9/21 due to previous INR<2.  INR is 0.98 today. Currently on triple therapy. CBC ordered for tomorrow.    Goal of Therapy:  INR 2-3    Plan:  Initiate warfarin  7.5 mg at 1800 x1 Monitor INR daily  Willia Craze, Pharmacy Student

## 2018-02-26 NOTE — Progress Notes (Signed)
Red area noted to left inner forearm and ac area. Pt states she thinks its a spider bite from about a week ago. Rennis Harding informed. Pt encouraged to keep checking this area.

## 2018-02-26 NOTE — Progress Notes (Signed)
TR BAND REMOVAL  LOCATION:  right radial  DEFLATED PER PROTOCOL:  Yes.    TIME BAND OFF / DRESSING APPLIED:   1630   SITE UPON ARRIVAL:   Level 1  SITE AFTER BAND REMOVAL:  Level 1  CIRCULATION SENSATION AND MOVEMENT:  Within Normal Limits  Yes.    COMMENTS:

## 2018-02-26 NOTE — Interval H&P Note (Signed)
History and Physical Interval Note:  02/26/2018 8:29 AM  [name]  has presented today for surgery, with the diagnosis of cp - abnormal stress  The various methods of treatment have been discussed with the patient and family. After consideration of risks, benefits and other options for treatment, the patient has consented to  Procedure(s): LEFT HEART CATH AND CORONARY ANGIOGRAPHY (N/A) as a surgical intervention .  The patient's history has been reviewed, patient examined, no change in status, stable for surgery.  I have reviewed the patient's chart and labs.  Questions were answered to the patient's satisfaction.   Cath Lab Visit (complete for each Cath Lab visit)  Clinical Evaluation Leading to the Procedure:   ACS: Yes.    Non-ACS:    Anginal Classification: CCS IV  Anti-ischemic medical therapy: Minimal Therapy (1 class of medications)  Non-Invasive Test Results: Intermediate-risk stress test findings: cardiac mortality 1-3%/year  Prior CABG: No previous CABG        [name] [facility] 02/26/2018 8:29 AM

## 2018-02-27 DIAGNOSIS — Z955 Presence of coronary angioplasty implant and graft: Secondary | ICD-10-CM | POA: Diagnosis not present

## 2018-02-27 DIAGNOSIS — I200 Unstable angina: Secondary | ICD-10-CM | POA: Diagnosis not present

## 2018-02-27 DIAGNOSIS — I10 Essential (primary) hypertension: Secondary | ICD-10-CM | POA: Diagnosis not present

## 2018-02-27 DIAGNOSIS — M79602 Pain in left arm: Secondary | ICD-10-CM | POA: Diagnosis not present

## 2018-02-27 DIAGNOSIS — I25110 Atherosclerotic heart disease of native coronary artery with unstable angina pectoris: Secondary | ICD-10-CM | POA: Diagnosis not present

## 2018-02-27 LAB — CBC
HCT: 38.6 % (ref 36.0–46.0)
Hemoglobin: 12.8 g/dL (ref 12.0–15.0)
MCH: 30.5 pg (ref 26.0–34.0)
MCHC: 33.2 g/dL (ref 30.0–36.0)
MCV: 91.9 fL (ref 78.0–100.0)
Platelets: 191 10*3/uL (ref 150–400)
RBC: 4.2 MIL/uL (ref 3.87–5.11)
RDW: 11.9 % (ref 11.5–15.5)
WBC: 12.5 10*3/uL — ABNORMAL HIGH (ref 4.0–10.5)

## 2018-02-27 LAB — BASIC METABOLIC PANEL
Anion gap: 11 (ref 5–15)
BUN: 11 mg/dL (ref 8–23)
CO2: 25 mmol/L (ref 22–32)
Calcium: 9.7 mg/dL (ref 8.9–10.3)
Chloride: 103 mmol/L (ref 98–111)
Creatinine, Ser: 0.86 mg/dL (ref 0.44–1.00)
GFR calc Af Amer: >60 mL/min (ref >60)
GFR, EST NON AFRICAN AMERICAN: 59 mL/min — AB (ref >60)
GLUCOSE: 160 mg/dL — AB (ref 70–99)
POTASSIUM: 3.1 mmol/L — AB (ref 3.5–5.1)
Sodium: 139 mmol/L (ref 135–145)

## 2018-02-27 LAB — PROTIME-INR
INR: 0.98
Prothrombin Time: 12.9 seconds (ref 11.4–15.2)

## 2018-02-27 LAB — GLUCOSE, CAPILLARY
GLUCOSE-CAPILLARY: 131 mg/dL — AB (ref 70–99)
Glucose-Capillary: 117 mg/dL — ABNORMAL HIGH (ref 70–99)

## 2018-02-27 MED ORDER — CLOPIDOGREL BISULFATE 75 MG PO TABS: 75.0000 mg | ORAL_TABLET | Freq: Every day | ORAL | 2 refills | Status: DC

## 2018-02-27 MED ORDER — PANTOPRAZOLE SODIUM 40 MG PO TBEC: 40.0000 mg | DELAYED_RELEASE_TABLET | Freq: Every day | ORAL | 1 refills | Status: DC

## 2018-02-27 MED ORDER — WARFARIN SODIUM 7.5 MG PO TABS
7.5000 mg | ORAL_TABLET | Freq: Once | ORAL | Status: AC
  Administered 2018-02-27: 7.5 mg via ORAL
  Filled 2018-02-27: qty 1

## 2018-02-27 MED ORDER — METOPROLOL SUCCINATE ER 25 MG PO TB24: 25.0000 mg | ORAL_TABLET | Freq: Every day | ORAL | 2 refills | Status: DC

## 2018-02-27 MED ORDER — METOPROLOL SUCCINATE ER 25 MG PO TB24
25.0000 mg | ORAL_TABLET | Freq: Every day | ORAL | Status: DC
  Administered 2018-02-27: 11:00:00 25 mg via ORAL
  Filled 2018-02-27: qty 1

## 2018-02-27 MED ORDER — POTASSIUM CHLORIDE CRYS ER 20 MEQ PO TBCR
40.0000 meq | EXTENDED_RELEASE_TABLET | Freq: Once | ORAL | Status: AC
  Administered 2018-02-27: 10:00:00 40 meq via ORAL
  Filled 2018-02-27: qty 2

## 2018-02-27 MED FILL — CLOPIDOGREL 75 MG TABLET: 75 | 90 days supply | Qty: 90 | Fill #0

## 2018-02-27 NOTE — Discharge Instructions (Addendum)
Information on my medicine - Coumadin   (Warfarin)  This medication education was reviewed with me or my healthcare representative as part of my discharge preparation.  The pharmacist that spoke with me during my hospital stay was:  Betsey Holiday, [facility]  Why was Coumadin prescribed for you? Coumadin was prescribed for you because you have a blood clot or a medical condition that can cause an increased risk of forming blood clots. Blood clots can cause serious health problems by blocking the flow of blood to the heart, lung, or brain. Coumadin can prevent harmful blood clots from forming. As a reminder your indication for Coumadin is:  History of stroke  What test will check on my response to Coumadin? While on Coumadin (warfarin) you will need to have an INR test regularly to ensure that your dose is keeping you in the desired range. The INR (international normalized ratio) number is calculated from the result of the laboratory test called prothrombin time (PT).  If an INR APPOINTMENT HAS NOT ALREADY BEEN MADE FOR YOU please schedule an appointment to have this lab work done by your health care provider within 7 days. Your INR goal is usually a number between:  2 to 3 or your provider may give you a more narrow range like 2-2.5.  Ask your health care provider during an office visit what your goal INR is.  What  do you need to  know  About  COUMADIN? Take Coumadin (warfarin) exactly as prescribed by your healthcare provider about the same time each day.  DO NOT stop taking without talking to the doctor who prescribed the medication.  Stopping without other blood clot prevention medication to take the place of Coumadin may increase your risk of developing a new clot or stroke.  Get refills before you run out.  What do you do if you miss a dose? If you miss a dose, take it as soon as you remember on the same day then continue your regularly scheduled regimen the next day.  Do not take two doses of  Coumadin at the same time.  Important Safety Information A possible side effect of Coumadin (Warfarin) is an increased risk of bleeding. You should call your healthcare provider right away if you experience any of the following: ? Bleeding from an injury or your nose that does not stop. ? Unusual colored urine (red or dark brown) or unusual colored stools (red or black). ? Unusual bruising for unknown reasons. ? A serious fall or if you hit your head (even if there is no bleeding).  Some foods or medicines interact with Coumadin (warfarin) and might alter your response to warfarin. To help avoid this: ? Eat a balanced diet, maintaining a consistent amount of Vitamin K. ? Notify your provider about major diet changes you plan to make. ? Avoid alcohol or limit your intake to 1 drink for women and 2 drinks for men per day. (1 drink is 5 oz. wine, 12 oz. beer, or 1.5 oz. liquor.)  Make sure that ANY health care provider who prescribes medication for you knows that you are taking Coumadin (warfarin).  Also make sure the healthcare provider who is monitoring your Coumadin knows when you have started a new medication including herbals and non-prescription products.  Coumadin (Warfarin)  Major Drug Interactions  Increased Warfarin Effect Decreased Warfarin Effect  Alcohol (large quantities) Antibiotics (esp. Septra/Bactrim, Flagyl, Cipro) Amiodarone (Cordarone) Aspirin (ASA) Cimetidine (Tagamet) Megestrol (Megace) NSAIDs (ibuprofen, naproxen, etc.) Piroxicam (Feldene)  Propafenone (Rythmol SR) Propranolol (Inderal) Isoniazid (INH) Posaconazole (Noxafil) Barbiturates (Phenobarbital) Carbamazepine (Tegretol) Chlordiazepoxide (Librium) Cholestyramine (Questran) Griseofulvin Oral Contraceptives Rifampin Sucralfate (Carafate) Vitamin K   Coumadin (Warfarin) Major Herbal Interactions  Increased Warfarin Effect Decreased Warfarin Effect  Garlic Ginseng Ginkgo biloba Coenzyme Q10 Green  tea St. Johns wort    Coumadin (Warfarin) FOOD Interactions  Eat a consistent number of servings per week of foods HIGH in Vitamin K (1 serving =  cup)  Collards (cooked, or boiled & drained) Kale (cooked, or boiled & drained) Mustard greens (cooked, or boiled & drained) Parsley *serving size only =  cup Spinach (cooked, or boiled & drained) Swiss chard (cooked, or boiled & drained) Turnip greens (cooked, or boiled & drained)  Eat a consistent number of servings per week of foods MEDIUM-HIGH in Vitamin K (1 serving = 1 cup)  Asparagus (cooked, or boiled & drained) Broccoli (cooked, boiled & drained, or raw & chopped) Brussel sprouts (cooked, or boiled & drained) *serving size only =  cup Lettuce, raw (green leaf, endive, romaine) Spinach, raw Turnip greens, raw & chopped   These websites have more information on Coumadin (warfarin):  FailFactory.se; VeganReport.com.au;  - - - - - - - - - - - - - - - - - - - - - - - - - - - - - - - - - - - - - - - - - - - - - - - - - - - - - - - - - - - - - - - - - - - - - - - - - - - - - - - - - - - - - - - - - - - - - - - - - - - - - - - - - - - - - - - - - Information about your medication: Plavix (anti-platelet agent)  Generic Name (Brand): clopidogrel (Plavix), once daily medication  PURPOSE: You are taking this medication along with aspirin to lower your chance of having a heart attack, stroke, or blood clots in your heart stent. These can be fatal. Brilinta and aspirin help prevent platelets from sticking together and forming a clot that can block an artery or your stent.   Common SIDE EFFECTS you may experience include: bruising or bleeding more easily, shortness of breath  Do not stop taking PLAVIX without talking to the doctor who prescribes it for you. People who are treated with a stent and stop taking Plavix too soon, have a higher risk of getting a blood clot in the stent, having a heart attack, or dying. If you  stop Plavix because of bleeding, or for other reasons, your risk of a heart attack or stroke may increase.   While on plavix, you should not take omeprazole since it makes plavix less effective. An alternative agent called pantoprazole was prescribed as a substitute for omeprazole that doesn't have the same interaction.   Tell all of your doctors and dentists that you are taking Plavix. They should talk to the doctor who prescribed plavix for you before you have any surgery or invasive procedure.   Contact your health care provider if you experience: severe or uncontrollable bleeding, pink/red/brown urine, vomiting blood or vomit that looks like "coffee grounds", red or black stools (looks like tar), coughing up blood or blood clots ----------------------------------------------------------------------------------------------------------------------

## 2018-02-27 NOTE — Progress Notes (Signed)
CARDIAC REHAB PHASE I   PRE:  Rate/Rhythm: 95 SR  BP:  Supine:   Sitting: 147/72  Standing:    SaO2: 93%RA  MODE:  Ambulation: 375 ft   POST:  Rate/Rhythm: 117 ST  BP:  Supine:   Sitting: 169/71  Standing:    SaO2: 94%RA 0900-0942 Pt walked 375 ft with her cane and asst x 1. Loss of balance once. Told husband and children that they need to walk with pt until steadier. They stated pt is always a little wobbly, but pt stated she felt wobblier with me since she hasn't walked much. Has rollator at home also that she is encouraged to use. Reviewed NTG use, importance of plavix, and gave diabetic and heart healthy diets. Discussed CRP 2 and referred to [place].   Graylon Good, RN BSN  02/27/2018 9:37 AM

## 2018-02-27 NOTE — Progress Notes (Signed)
[place] for Warfarin Indication: Reccurent TIA  Allergies  Allergen Reactions  . Ivp Dye [Iodinated Diagnostic Agents] Anaphylaxis  . Sulfonamide Derivatives Shortness Of Breath  . Lipitor [Atorvastatin Calcium]     Muscle Aches  . Zetia [Ezetimibe]     Muscle Aches  . Penicillins Rash    As a teenager. Has patient had a PCN reaction causing immediate rash, facial/tongue/throat swelling, SOB or lightheadedness with hypotension: No Has patient had a PCN reaction causing severe rash involving mucus membranes or skin necrosis: No Has patient had a PCN reaction that required hospitalization: No Has patient had a PCN reaction occurring within the last 10 years: No If all of the above answers are "NO", then may proceed with Cephalosporin use.     Patient Measurements: Height: 5' 4"  (162.6 cm) Weight: 163 lb 12.8 oz (74.3 kg) IBW/kg (Calculated) : 54.7  Vital Signs: Temp: 98.1 F (36.7 C) (09/25 0712) Temp Source: Oral (09/25 0147) BP: 116/59 (09/25 0712) Pulse Rate: 95 (09/25 0712)  Labs: Recent Labs    02/26/18 0707 02/27/18 0222  HGB  --  12.8  HCT  --  38.6  PLT  --  191  LABPROT 12.9 12.9  INR 0.98 0.98  CREATININE  --  0.86    Estimated Creatinine Clearance: 45.5 mL/min (by C-G formula based on SCr of 0.86 mg/dL).   Medical History: Past Medical History:  Diagnosis Date  . Allergic rhinitis   . Arthritis    "probably in my hands" (02/26/2018)  . Colonic polyp   . Coronary artery disease   . GERD (gastroesophageal reflux disease)   . History of blood transfusion    "don't remember why or when" (02/26/2018)  . Hyperlipidemia   . Hypertension   . Nonalcoholic hepatosteatosis   . TIA (transient ischemic attack) 2000s   "I've had ~ 3" (02/26/2018)  . Type II diabetes mellitus (HCC)     Assessment: 38 yoF s/p cath requiring anticoagulation due to history of TIAs and PFO noted on prior echocardiogram. PTA patient was  taking warfarin 5 mg daily, except 7.5 mg on Saturdays. Patient was told to hold warfarin beginning 9/20 for planned cath. She was initiated on a Lovenox bridge 9/21 due to previous INR<2.  INR today remains at 0.98. Bruising continues at wrist site; however, stable. No other s/sx of bleeding. Hgb 12.8, plt 191. Currently on triple therapy - will not discharge on enoxaparin bridge due to bleed risk.  Goal of Therapy:  INR 2-3    Plan:  Order warfarin 7.5 mg prior to discharge Plan to resume home regimen starting tomorrow Plan for INR appointment on 03/05/2018 - have been in contact with office Continue triple therapy for 1 month then stop aspirin and continue only on plavix and warfarin therapy.  Doylene Canard, PharmD Clinical Pharmacist  Pager: [phone] Phone: [phone]

## 2018-02-27 NOTE — Research (Signed)
Spoke with patient yesterday and this morning about [place] research study. She has read over the ICF but declines to participate. I thanked her for allowing me to speak with her.

## 2018-02-27 NOTE — Discharge Summary (Addendum)
Discharge Summary    Patient ID: [name],  MRN: [id number], DOB/AGE: 1928/02/19 84 y.o.  Admit date: 02/26/2018 Discharge date: 02/27/2018  Primary Care Provider: Loman Bell Primary Cardiologist: [name]  Discharge Diagnoses    Principal Problem:   Unstable angina [facility]) Active Problems:   HYPERCHOLESTEROLEMIA   Essential hypertension   Long term (current) use of anticoagulants   Allergies Allergies  Allergen Reactions  . Ivp Dye [Iodinated Diagnostic Agents] Anaphylaxis  . Sulfonamide Derivatives Shortness Of Breath  . Lipitor [Atorvastatin Calcium]     Muscle Aches  . Zetia [Ezetimibe]     Muscle Aches  . Penicillins Rash    As a teenager. Has patient had a PCN reaction causing immediate rash, facial/tongue/throat swelling, SOB or lightheadedness with hypotension: No Has patient had a PCN reaction causing severe rash involving mucus membranes or skin necrosis: No Has patient had a PCN reaction that required hospitalization: No Has patient had a PCN reaction occurring within the last 10 years: No If all of the above answers are "NO", then may proceed with Cephalosporin use.     Diagnostic Studies/Procedures    Cath: 02/26/18   Dist RCA lesion is 90% stenosed.  Prox LAD lesion is 95% stenosed.  Ost 1st Mrg lesion is 70% stenosed.  A drug-eluting stent was successfully placed using a STENT SIERRA 3.50 X 15 MM.  Post intervention, there is a 0% residual stenosis.  Post intervention, there is a 0% residual stenosis.  A drug-eluting stent was successfully placed using a STENT SIERRA 3.00 X 15 MM.  LV end diastolic pressure is normal.   1. 3 vessel obstructive CAD.     - focal 95% proximal LAD    - 70% OM1    - 90% focal distal RCA 2. Normal LVEDP 3. Successful PCI of the Distal RCA with DES x 1 4. Successful PCI of the proximal LAD with DES x 1  Plan: observe overnight. May resume coumadin tonight per pharmacy. Since she is on DAPT  I would not bridge with Lovenox. Anticipate DC in am. She is a candidate for the Xience 28 study and if enrolled this will determine her antiplatelet strategy.  Recommend to resume Warfarin, at currently prescribed dose and frequency, on 02/26/18.  Recommend concurrent antiplatelet therapy of Aspirin 71m daily for 1 month and Clopidogrel 768mdaily for 6 months. _____________   History of Present Illness     [age].o. female with past medical history of carotid artery stenosis, HTN, HLD, Type II DM, and recurrent TIA's (with PFO noted on prior echocardiogram, on Coumadin for anticoagulation) who presented to the office for evaluation of chest pain.   She was last examined by Dr. KoBronson Ingn 05/2017 and denied any recent chest pain or dyspnea on exertion at that time. Was continued on her current medication regimen. She presented to the office on 01/01/2018 for a routine Coumadin check and reported having left arm pain over the past few weeks which was relieved with sublingual nitroglycerin. Therefore, close follow-up was recommended for further evaluation.  In talking with the patient at her follow up appt, she reported having episodes of left arm pain over the past few months which can occur at rest or with activity. Reported her symptoms were sometimes worse at night and she would massage her left arm with improvement. Symptoms did not resolve a few nights prior to office visit with massage and she took sublingual nitroglycerin with resolution of her  symptoms. She denied any specific chest discomfort but had noticed worsening dyspnea on exertion over the past several weeks as well. No recent orthopnea, PND, or lower extremity edema.  She has no known history of CAD but reported her mother died of heart attack and she does have multiple cardiac risk factors including HTN, HLD, and Type II DM. Her cholesterol has been followed by her PCP and she has been intolerant to multiple statins and was tried  on Zetia over the past few months but experienced myalgias with this as well. Given her symptoms, she was set up for outpatient cardiac cath.    Hospital Course     Underwent cardiac cath noted above DES/PCI to the dRCA, and pLAD. Plan for triple therapy with ASA/plavix/coumadin for one month, then plan to stop ASA. Post cath labs were stable. Mild hypokalemia that was supplemented. Reported intolerance to statins and Zetia in the past 2/2 myalgias. No further chest pain overnight. INR noted at 0.98 post cath, but do not plan to bridge given the need for triple therapy. Worked well with cardiac rehab. HR and BP remained elevated, and therefore added Toprol 74m daily and asked that she continue to follow her BP at home. Worked well with cardiac rehab without recurrent chest pain. Message sent to the Lipid clinic to determine eligibility for PCSK9s.   General: Well developed, well nourished, female appearing in no acute distress. Head: Normocephalic, atraumatic.  Neck: Supple, no JVD. Lungs:  Resp regular and unlabored, CTA. Heart: RRR, S1, S2, nomurmur; no rub. Abdomen: Soft, non-tender, non-distended with normoactive bowel sounds.  Extremities: No clubbing, cyanosis, edema. Distal pedal pulses are 2+ bilaterally. R radial cath site stable without bruising or hematoma Neuro: Alert and oriented X 3. Moves all extremities spontaneously. Psych: Normal affect.  DBaxter Flatterywas seen by Dr. CBurt Knackand determined stable for discharge home. Follow up in the office has been arranged. Medications are listed below.   _____________  Discharge Vitals Blood pressure (!) 116/59, pulse 95, temperature 98.1 F (36.7 C), resp. rate (!) 26, height 5' 4"  (1.626 m), weight 74.3 kg, SpO2 93 %.  Filed Weights   02/26/18 0706 02/27/18 0147  Weight: 73.9 kg 74.3 kg    Labs & Radiologic Studies    CBC Recent Labs    02/27/18 0222  WBC 12.5*  HGB 12.8  HCT 38.6  MCV 91.9  PLT 1161  Basic Metabolic  Panel Recent Labs    02/27/18 0222  NA 139  K 3.1*  CL 103  CO2 25  GLUCOSE 160*  BUN 11  CREATININE 0.86  CALCIUM 9.7   Liver Function Tests No results for input(s): AST, ALT, ALKPHOS, BILITOT, PROT, ALBUMIN in the last 72 hours. No results for input(s): LIPASE, AMYLASE in the last 72 hours. Cardiac Enzymes No results for input(s): CKTOTAL, CKMB, CKMBINDEX, TROPONINI in the last 72 hours. BNP Invalid input(s): POCBNP Ana-Dimer No results for input(s): DDIMER in the last 72 hours. Hemoglobin A1C No results for input(s): HGBA1C in the last 72 hours. Fasting Lipid Panel No results for input(s): CHOL, HDL, LDLCALC, TRIG, CHOLHDL, LDLDIRECT in the last 72 hours. Thyroid Function Tests No results for input(s): TSH, T4TOTAL, T3FREE, THYROIDAB in the last 72 hours.  Invalid input(s): FREET3 _____________  UKoreaCarotid Duplex Bilateral  Result Date: 02/05/2018 CLINICAL DATA:  Hypertension, hyperlipidemia, vertigo EXAM: BILATERAL CAROTID DUPLEX ULTRASOUND TECHNIQUE: GPearline Cablesscale imaging, color Doppler and duplex ultrasound were performed of bilateral carotid and vertebral arteries  in the neck. COMPARISON:  04/03/2013 FINDINGS: Criteria: Quantification of carotid stenosis is based on velocity parameters that correlate the residual internal carotid diameter with NASCET-based stenosis levels, using the diameter of the distal internal carotid lumen as the denominator for stenosis measurement. The following velocity measurements were obtained: RIGHT ICA: 65/19 cm/sec CCA: 22/0 cm/sec SYSTOLIC ICA/CCA RATIO:  1.1 ECA: 92 cm/sec LEFT ICA: 114/31 cm/sec CCA: 25/42 cm/sec SYSTOLIC ICA/CCA RATIO:  2.2 ECA: 72 cm/sec RIGHT CAROTID ARTERY: Minor echogenic shadowing plaque formation. No hemodynamically significant right ICA stenosis, velocity elevation, or turbulent flow. Degree of narrowing less than 50%. RIGHT VERTEBRAL ARTERY:  Antegrade LEFT CAROTID ARTERY: Similar scattered minor echogenic plaque formation.  No hemodynamically significant left ICA stenosis, velocity elevation, or turbulent flow. LEFT VERTEBRAL ARTERY:  Antegrade IMPRESSION: Minor carotid atherosclerosis. No hemodynamically significant ICA stenosis. Degree of narrowing less than 50% bilaterally by ultrasound criteria. Patent antegrade vertebral flow bilaterally Electronically Signed   By: Jerilynn Mages.  Shick M.Ana.   On: 02/05/2018 09:35   Nm Myocar Multi W/spect W/wall Motion / Ef  Result Date: 02/05/2018  No diagnostic ST segment changes to indicate ischemia.  Large, moderate intensity, partially reversible defect being involving the apical inferolateral wall, and mid to basal inferoseptal wall through inferolateral wall. The inferolateral segment is partially reversible at the apex and there is additional partial reversibility in the basal inferoseptal and inferolateral walls. Overall consistent with scar and moderate peri-infarct ischemia.  This is an intermediate risk study.  Nuclear stress EF: 54%.    Disposition   Pt is being discharged home today in good condition.  Follow-up Plans & Appointments    Follow-up Information    Erma Heritage, PA-C Follow up on 04/02/2018.   Specialties:  Physician Assistant, Cardiology Why:  at 1pm for your follow up appt.  Contact information: [place] [address] [phone]        [facility] Follow up on 03/05/2018.   Specialty:  Cardiology Why:  at 1:45pm for your follow up Coumadin appt.  Contact information: [place] [phone]         Discharge Instructions    Call MD for:  redness, tenderness, or signs of infection (pain, swelling, redness, odor or green/yellow discharge around incision site)   Complete by:  As directed    Diet - low sodium heart healthy   Complete by:  As directed    Discharge instructions   Complete by:  As directed    Radial Site Care Refer to this sheet in the next few weeks. These  instructions provide you with information on caring for yourself after your procedure. Your caregiver may also give you more specific instructions. Your treatment has been planned according to current medical practices, but problems sometimes occur. Call your caregiver if you have any problems or questions after your procedure. HOME CARE INSTRUCTIONS You may shower the day after the procedure.Remove the bandage (dressing) and gently wash the site with plain soap and water.Gently pat the site dry.  Do not apply powder or lotion to the site.  Do not submerge the affected site in water for 3 to 5 days.  Inspect the site at least twice daily.  Do not flex or bend the affected arm for 24 hours.  No lifting over 5 pounds (2.3 kg) for 10 days after your procedure.  Do not drive home if you are discharged the same day of the procedure. Have someone  else drive you.  You may drive 3 days after the procedure unless otherwise instructed by your caregiver.  What to expect: Any bruising will usually fade within 1 to 2 weeks.  Blood that collects in the tissue (hematoma) may be painful to the touch. It should usually decrease in size and tenderness within 1 to 2 weeks.  SEEK IMMEDIATE MEDICAL CARE IF: You have unusual pain at the radial site.  You have redness, warmth, swelling, or pain at the radial site.  You have drainage (other than a small amount of blood on the dressing).  You have chills.  You have a fever or persistent symptoms for more than 72 hours.  You have a fever and your symptoms suddenly get worse.  Your arm becomes pale, cool, tingly, or numb.  You have heavy bleeding from the site. Hold pressure on the site.   PLEASE DO NOT MISS ANY DOSES OF YOUR PLAVIX!!!!! Also keep a log of you blood pressures and bring back to your follow up appt. Please call the office with any questions.   Patients taking blood thinners should generally stay away from medicines like ibuprofen, Advil, Motrin,  naproxen, and Aleve due to risk of stomach bleeding. You may take Tylenol as directed or talk to your primary doctor about alternatives.  Some studies suggest Prilosec/Omeprazole interacts with Plavix. We changed your Prilosec/Omeprazole to the equivalent dose of Protonix for less chance of interaction.   Increase activity slowly   Complete by:  As directed       Discharge Medications     Medication List    STOP taking these medications   enoxaparin 120 MG/0.8ML injection Commonly known as:  LOVENOX   omeprazole 20 MG capsule Commonly known as:  PRILOSEC     TAKE these medications   amLODipine 10 MG tablet Commonly known as:  NORVASC Take 1 tablet (10 mg total) by mouth daily.   aspirin EC 81 MG tablet Take 1 tablet (81 mg total) by mouth daily.   clopidogrel 75 MG tablet Commonly known as:  PLAVIX Take 1 tablet (75 mg total) by mouth daily with breakfast.   docusate sodium 100 MG capsule Commonly known as:  COLACE Take 200 mg by mouth 2 (two) times daily as needed for mild constipation.   fexofenadine 180 MG tablet Commonly known as:  ALLEGRA Take 180 mg by mouth daily.   fluticasone 50 MCG/ACT nasal spray Commonly known as:  FLONASE Place 2 sprays into both nostrils daily.   glipiZIDE 2.5 MG 24 hr tablet Commonly known as:  GLUCOTROL XL Take 2.5 mg by mouth daily with breakfast.   losartan 100 MG tablet Commonly known as:  COZAAR Take 100 mg by mouth daily.   metFORMIN 500 MG 24 hr tablet Commonly known as:  GLUCOPHAGE-XR Take 500 mg by mouth daily with breakfast.   metoprolol succinate 25 MG 24 hr tablet Commonly known as:  TOPROL-XL Take 1 tablet (25 mg total) by mouth daily.   multivitamin tablet Take 1 tablet by mouth daily.   nitroGLYCERIN 0.4 MG SL tablet Commonly known as:  NITROSTAT Place 0.4 mg under the tongue every 5 (five) minutes as needed for chest pain.   pantoprazole 40 MG tablet Commonly known as:  PROTONIX Take 1 tablet (40 mg  total) by mouth daily.   REFRESH 1 % ophthalmic solution Generic drug:  carboxymethylcellulose Place 1 drop into both eyes 2 (two) times daily as needed (dry eyes).   ursodiol 300 MG capsule  Commonly known as:  ACTIGALL Take 300 mg by mouth 2 (two) times daily.   vitamin E 400 UNIT capsule Generic drug:  vitamin E Take 400 Units by mouth 2 (two) times daily.   warfarin 5 MG tablet Commonly known as:  COUMADIN Take as directed. If you are unsure how to take this medication, talk to your nurse or doctor. Original instructions:  Take 1 tablet (5 mg total) by mouth daily. What changed:    how much to take  when to take this  additional instructions      Acute coronary syndrome (MI, NSTEMI, STEMI, etc) this admission?: No.     Outstanding Labs/Studies   INR check 10/1  Duration of Discharge Encounter   Greater than 30 minutes including physician time.  Signed, Reino Bellis NP-C 02/27/2018, 9:10 AM   Patient seen, examined. Available data reviewed. Agree with findings, assessment, and plan as outlined by Reino Bellis, NP-C.  The patient is independently interviewed and examined this morning.  She is alert, oriented, in no distress.  Heart is regular rate and rhythm with no murmurs, abdomen is soft and nontender, lung fields are clear, extremities show no edema.  She underwent successful PCI of the LAD and right coronary arteries yesterday.  She has declined enrollment in the Xience trial.  Recommend 30 days of triple therapy with aspirin, clopidogrel, and warfarin, then discontinue aspirin.  Sherren Mocha, M.Ana. 02/27/2018 10:32 AM

## 2018-03-05 ENCOUNTER — Ambulatory Visit (INDEPENDENT_AMBULATORY_CARE_PROVIDER_SITE_OTHER): Payer: Medicare Other | Admitting: *Deleted

## 2018-03-05 DIAGNOSIS — Z7901 Long term (current) use of anticoagulants: Secondary | ICD-10-CM | POA: Diagnosis not present

## 2018-03-05 DIAGNOSIS — Z5181 Encounter for therapeutic drug level monitoring: Secondary | ICD-10-CM | POA: Diagnosis not present

## 2018-03-05 DIAGNOSIS — G459 Transient cerebral ischemic attack, unspecified: Secondary | ICD-10-CM

## 2018-03-05 LAB — POCT INR: INR: 1.2 — AB (ref 2.0–3.0)

## 2018-03-05 NOTE — Patient Instructions (Signed)
S/P DES to RCA and LAD.  Was not sent home on Lovenox due to chance of bleeding with triple drug therapy. Take coumadin 1 1/2 tablet tonight thru Saturday.  On Sunday resume 1 tablet daily except 1 1/2 tablets on Saturdays Recheck in 1 week

## 2018-03-06 ENCOUNTER — Encounter: Payer: Medicare Other | Attending: Family Medicine | Admitting: Nutrition

## 2018-03-06 VITALS — Ht 64.0 in | Wt 166.0 lb

## 2018-03-06 DIAGNOSIS — Z7984 Long term (current) use of oral hypoglycemic drugs: Secondary | ICD-10-CM | POA: Insufficient documentation

## 2018-03-06 DIAGNOSIS — Z6828 Body mass index (BMI) 28.0-28.9, adult: Secondary | ICD-10-CM | POA: Insufficient documentation

## 2018-03-06 DIAGNOSIS — E119 Type 2 diabetes mellitus without complications: Secondary | ICD-10-CM | POA: Diagnosis not present

## 2018-03-06 DIAGNOSIS — E1165 Type 2 diabetes mellitus with hyperglycemia: Secondary | ICD-10-CM

## 2018-03-06 DIAGNOSIS — E118 Type 2 diabetes mellitus with unspecified complications: Secondary | ICD-10-CM

## 2018-03-06 DIAGNOSIS — E669 Obesity, unspecified: Secondary | ICD-10-CM

## 2018-03-06 DIAGNOSIS — Z713 Dietary counseling and surveillance: Secondary | ICD-10-CM | POA: Diagnosis not present

## 2018-03-06 DIAGNOSIS — IMO0002 Reserved for concepts with insufficient information to code with codable children: Secondary | ICD-10-CM

## 2018-03-06 NOTE — Progress Notes (Signed)
   Medical Nutrition Therapy:  Appt start time: 2876  end time:  1115 Assessment:  Primary concerns today: Diabetes Type 2.  Had a heart cath Sept 24th, put in 2 stents. Taking GLipizide and Metformin 500 mg ER. Is now on Plavix daily and Toporal..  1 1/2 pills per day of Coumadin. Currently on baby aspirin  81 mg a day. Has cut out sodas. Drinking water 2 bottles per day. Working on cutting out fried foods and eating foods baked and broiled.  Doesn't feel like cooking at times and skips lunch sometimes. FBS  135-140's. PCP Camila Li FNP. Last A1C 7.1% .  BMI 28. Working on getting her strength back to start walking. Not doing Cardiac Rehab yet.   Preferred Learning Style:  Auditory  No preference indicated   Learning Readiness:  Ready  Change in progress MEDICATIONS   DIETARY INTAKE:  24-hr recall:  B ( AM):  2 eggs, 2 bacon occasionally and toast and decaf coffee and 1/2 banana Snk ( AM): sometimes fruit bar,   L ( PM): 1/2 baked potato, green beans, tomatoes/onions, 1 slice wheat bread,  Chicken   Snk ( PM):  D ( PM): Tomato sandwich, water, applesauce Snk ( PM):  Beverages: water, or unsweet tea  Usual physical activity ADL   Estimated energy needs: 1200  calories 135 g carbohydrates 90 g protein 33 g fat  Progress Towards Goal(s):  In progress.   Nutritional Diagnosis:  NB-1.1 Food and nutrition-related knowledge deficit As related to Diabetes and overweight   As evidenced by A1c 7.1 % AND BMI 29    Intervention:  Nutrition and Diabetes education provided on My Plate, CHO counting, meal planning, portion sizes, timing of meals, avoiding snacks between meals unless having a low blood sugar, target ranges for A1C and blood sugars, signs/symptoms and treatment of hyper/hypoglycemia, monitoring blood sugars, taking medications as prescribed, benefits of exercising 30 minutes per day and prevention of complications of DM. Low Salt Diet and HIgh Fiber foods.  Strsseed need High Fiber foods choices for  Meals and don't skip meals..  Goals 1 Try Smart Ones or Healthy CHoice TV dinner if desired 2. Try Shredded wheat or FIber one for cold cereals 3. Increase berries, melons  4. Increase water to 3 bottles per day Lose 1 lb per week.  Teaching Method Utilized:  Visual Auditory Hands on  Handouts given during visit include:  The Plate Method   Meal Plan Card  Diabetes Instrucitons  Low Salt Heart Healthy Handout    Barriers to learning/adherence to lifestyle change: none  Demonstrated degree of understanding via:  Teach Back   Monitoring/Evaluation:  Dietary intake, exercise, meal plannign , and body weight in 2-3 month(s). May consider a GLP 1 for needed weight loss.

## 2018-03-06 NOTE — Patient Instructions (Addendum)
Goals 1 Try Smart Ones or Healthy CHoice TV dinner if desired 2. Try Shredded wheat or FIber one for cold cereals 3. Increase berries, melons  4. Increase water to 3 bottles per day Lose 1 lb per week.

## 2018-03-11 ENCOUNTER — Encounter: Payer: Self-pay | Admitting: Nutrition

## 2018-03-12 ENCOUNTER — Ambulatory Visit (INDEPENDENT_AMBULATORY_CARE_PROVIDER_SITE_OTHER): Payer: Medicare Other | Admitting: *Deleted

## 2018-03-12 DIAGNOSIS — G459 Transient cerebral ischemic attack, unspecified: Secondary | ICD-10-CM | POA: Diagnosis not present

## 2018-03-12 DIAGNOSIS — Z7901 Long term (current) use of anticoagulants: Secondary | ICD-10-CM

## 2018-03-12 DIAGNOSIS — Z5181 Encounter for therapeutic drug level monitoring: Secondary | ICD-10-CM | POA: Diagnosis not present

## 2018-03-12 LAB — POCT INR: INR: 1.8 — AB (ref 2.0–3.0)

## 2018-03-12 NOTE — Patient Instructions (Signed)
Increase coumadin to 1 tablet daily except 1 1/2 tablets on Tuesdays and Saturdays Recheck in 10 days

## 2018-03-21 ENCOUNTER — Ambulatory Visit (INDEPENDENT_AMBULATORY_CARE_PROVIDER_SITE_OTHER): Payer: Medicare Other | Admitting: *Deleted

## 2018-03-21 DIAGNOSIS — Z7901 Long term (current) use of anticoagulants: Secondary | ICD-10-CM

## 2018-03-21 DIAGNOSIS — G459 Transient cerebral ischemic attack, unspecified: Secondary | ICD-10-CM | POA: Diagnosis not present

## 2018-03-21 DIAGNOSIS — Z5181 Encounter for therapeutic drug level monitoring: Secondary | ICD-10-CM | POA: Diagnosis not present

## 2018-03-21 LAB — POCT INR: INR: 1.7 — AB (ref 2.0–3.0)

## 2018-03-21 NOTE — Patient Instructions (Signed)
Increase coumadin to 1 1/2 tablets daily except 1 tablet on Sundays and Wednesdays Recheck in 2 wks Stop ASA 1 month after stent placement  Stop 04/02/18

## 2018-03-29 NOTE — Progress Notes (Signed)
Cardiology Office Note    Date:  04/02/2018   ID:  PAYTIN RAMAKRISHNAN, DOB 05-12-1928, MRN [id number]  PCP:  Loman Brooklyn, FNP  Cardiologist: Kate Sable, MD    Chief Complaint  Patient presents with  . Follow-up    s/p catheterization    History of Present Illness:    [name] is a 83 y.o. female with past medical history of carotid artery stenosis, HTN, HLD, Type II DM, and recurrent TIA's (with PFO noted on prior echocardiogram, on Coumadin for anticoagulation) who presents to the office today for follow-up of her recent cardiac catheterization.   She was examined by myself in 01/2018 and reported having left arm pain and worsening dyspnea on exertion over the past several weeks. A stress test was performed for initial evaluation and showed scar and moderate peri-infarct ischemia, overall being an intermediate risk study.  Given her symptoms and multiple risk factors, a cardiac catheterization was recommended for definitive evaluation. This was performed by Dr. Martinique on 02/26/2018 and showed three-vessel CAD with 95% proximal LAD stenosis, 90% distal RCA stenosis, and 70% OM1 stenosis.  She underwent successful PCI with DES x1 to the distal RCA and DES x1 to the proximal LAD. She was restarted on Coumadin following her procedure with plans to continue concurrent antiplatelet therapy of ASA 81 mg daily for 1 month and Clopidogrel 75 mg daily for 6 months. She was not bridged with Lovenox given the need for triple therapy.  A message was sent to the [facility] at the time of discharge in regards to initiation of PCSK9 inhibitor therapy. It was recommended that perhaps she try a low-dose of Crestor initially given her prior intolerance to Atorvastatin, Simvastatin, and Zetia.  In talking with the patient today, she reports overall doing very well since her recent cardiac catheterization. She denies any recurrent left arm pain and reports her breathing is the best it has been in  years. Denies any recent chest pain, orthopnea, PND, lower extremity edema, or palpitations.  She has completed her 1 month of ASA and remains on Plavix and Coumadin at this time.  Denies any evidence of active bleeding.   Past Medical History:  Diagnosis Date  . Allergic rhinitis   . Arthritis    "probably in my hands" (02/26/2018)  . Colonic polyp   . Coronary artery disease    a. s/p cath on 02/26/2018 with 3-vessel CAD with 95% proximal LAD, 90% distal RCA, and 70% OM1 stenosis.  She underwent successful PCI with DES x1 to the distal RCA and DES x1 to the proximal LAD  . GERD (gastroesophageal reflux disease)   . History of blood transfusion    "don't remember why or when" (02/26/2018)  . Hyperlipidemia   . Hypertension   . Nonalcoholic hepatosteatosis   . TIA (transient ischemic attack) 2000s   "I've had ~ 3" (02/26/2018)  . Type II diabetes mellitus ([place])     Past Surgical History:  Procedure Laterality Date  . ABDOMINAL HYSTERECTOMY    . APPENDECTOMY  1950s  . CARDIAC CATHETERIZATION  2001   Nonobstructive CAD  . CATARACT EXTRACTION W/ INTRAOCULAR LENS  IMPLANT, BILATERAL Bilateral 2000s  . CORONARY STENT INTERVENTION N/A 02/26/2018   Procedure: CORONARY STENT INTERVENTION;  Surgeon: Martinique, Peter M, MD;  Location: [facility];  Service: Cardiovascular;  Laterality: N/A;  rca   . LEFT HEART CATH AND CORONARY ANGIOGRAPHY N/A 02/26/2018   Procedure: LEFT HEART CATH AND  CORONARY ANGIOGRAPHY;  Surgeon: Martinique, Peter M, MD;  Location: [facility];  Service: Cardiovascular;  Laterality: N/A;  . TONSILLECTOMY  1943    Current Medications: Outpatient Medications Prior to Visit  Medication Sig Dispense Refill  . amLODipine (NORVASC) 10 MG tablet Take 1 tablet (10 mg total) by mouth daily. 90 tablet 3  . carboxymethylcellulose (REFRESH) 1 % ophthalmic solution Place 1 drop into both eyes 2 (two) times daily as needed (dry eyes).     Marland Kitchen docusate sodium (COLACE) 100 MG  capsule Take 200 mg by mouth 2 (two) times daily as needed for mild constipation.     . fexofenadine (ALLEGRA) 180 MG tablet Take 180 mg by mouth daily.      . fluticasone (FLONASE) 50 MCG/ACT nasal spray Place 2 sprays into both nostrils daily.     Marland Kitchen glipiZIDE (GLUCOTROL XL) 2.5 MG 24 hr tablet Take 2.5 mg by mouth daily with breakfast.     . losartan (COZAAR) 100 MG tablet Take 100 mg by mouth daily.    . metFORMIN (GLUCOPHAGE-XR) 500 MG 24 hr tablet Take 500 mg by mouth daily with breakfast.      . Multiple Vitamin (MULTIVITAMIN) tablet Take 1 tablet by mouth daily.      . nitroGLYCERIN (NITROSTAT) 0.4 MG SL tablet Place 0.4 mg under the tongue every 5 (five) minutes as needed for chest pain.    . ursodiol (ACTIGALL) 300 MG capsule Take 300 mg by mouth 2 (two) times daily.     . vitamin E (VITAMIN E) 400 UNIT capsule Take 400 Units by mouth 2 (two) times daily.     . clopidogrel (PLAVIX) 75 MG tablet Take 1 tablet (75 mg total) by mouth daily with breakfast. 90 tablet 2  . metoprolol succinate (TOPROL-XL) 25 MG 24 hr tablet Take 1 tablet (25 mg total) by mouth daily. 30 tablet 2  . pantoprazole (PROTONIX) 40 MG tablet Take 1 tablet (40 mg total) by mouth daily. 30 tablet 1  . warfarin (JANTOVEN) 5 MG tablet Take coumadin 1 1/2 tablets daily except 1 tablet on Sundays and Wednesdays 135 tablet 3  . aspirin EC 81 MG tablet Take 1 tablet (81 mg total) by mouth daily. 90 tablet 3   No facility-administered medications prior to visit.      Allergies:   Ivp dye [iodinated diagnostic agents]; Sulfonamide derivatives; Lipitor [atorvastatin calcium]; Zetia [ezetimibe]; and Penicillins   Social History   Socioeconomic History  . Marital status: Married    Spouse name: Not on file  . Number of children: Not on file  . Years of education: Not on file  . Highest education level: Not on file  Occupational History  . Not on file  Social Needs  . Financial resource strain: Not on file  . Food  insecurity:    Worry: Not on file    Inability: Not on file  . Transportation needs:    Medical: Not on file    Non-medical: Not on file  Tobacco Use  . Smoking status: Former Smoker    Packs/day: 2.00    Years: 40.00    Pack years: 80.00    Types: Cigarettes    Last attempt to quit: 06/05/1992    Years since quitting: 25.8  . Smokeless tobacco: Former Systems developer    Types: Chew    Quit date: 12/03/1992  . Tobacco comment: used tobacco to help quit smoking  Substance and Sexual Activity  . Alcohol use: Never  Frequency: Never  . Drug use: Never  . Sexual activity: Not on file  Lifestyle  . Physical activity:    Days per week: Not on file    Minutes per session: Not on file  . Stress: Not on file  Relationships  . Social connections:    Talks on phone: Not on file    Gets together: Not on file    Attends religious service: Not on file    Active member of club or organization: Not on file    Attends meetings of clubs or organizations: Not on file    Relationship status: Not on file  Other Topics Concern  . Not on file  Social History Narrative  . Not on file     Family History:  The patient's family history includes Heart failure in her mother; Prostate cancer in her brother and father.   Review of Systems:   Please see the history of present illness.     General:  No chills, fever, night sweats or weight changes.  Cardiovascular:  No chest pain, edema, orthopnea, palpitations, paroxysmal nocturnal dyspnea. Positive for dyspnea (improved).  Dermatological: No rash, lesions/masses Respiratory: No cough, dyspnea Urologic: No hematuria, dysuria Abdominal:   No nausea, vomiting, diarrhea, bright red blood per rectum, melena, or hematemesis Neurologic:  No visual changes, wkns, changes in mental status. All other systems reviewed and are otherwise negative except as noted above.   Physical Exam:    VS:  BP 122/70   Pulse 94   Ht 5' 4"  (1.626 m)   Wt 166 lb 12.8 oz (75.7  kg)   SpO2 97%   BMI 28.63 kg/m    General: Well developed, well nourished elderly Caucasian female appearing in no acute distress. Head: Normocephalic, atraumatic, sclera non-icteric, no xanthomas, nares are without discharge.  Neck: No carotid bruits. JVD not elevated.  Lungs: Respirations regular and unlabored, without wheezes or rales.  Heart: Regular rate and rhythm. No S3 or S4.  No murmur, no rubs, or gallops appreciated. Abdomen: Soft, non-tender, non-distended with normoactive bowel sounds. No hepatomegaly. No rebound/guarding. No obvious abdominal masses. Msk:  Strength and tone appear normal for age. No joint deformities or effusions. Extremities: No clubbing or cyanosis. No lower extremity edema.  Distal pedal pulses are 2+ bilaterally. Radial site without ecchymosis or evidence of a hematoma.  Neuro: Alert and oriented X 3. Moves all extremities spontaneously. No focal deficits noted. Psych:  Responds to questions appropriately with a normal affect. Skin: No rashes or lesions noted  Wt Readings from Last 3 Encounters:  04/02/18 166 lb 12.8 oz (75.7 kg)  03/06/18 166 lb (75.3 kg)  02/27/18 163 lb 12.8 oz (74.3 kg)     Studies/Labs Reviewed:   EKG:  EKG is ordered today.  The ekg ordered today demonstrates normal sinus rhythm, heart rate 85, with anterior Q waves and no acute ST changes when compared to prior tracings.  Recent Labs: 02/27/2018: BUN 11; Creatinine, Ser 0.86; Hemoglobin 12.8; Platelets 191; Potassium 3.1; Sodium 139   Lipid Panel No results found for: CHOL, TRIG, HDL, CHOLHDL, VLDL, LDLCALC, LDLDIRECT  Additional studies/ records that were reviewed today include:   Cardiac Catheterization: 02/26/2018  Dist RCA lesion is 90% stenosed.  Prox LAD lesion is 95% stenosed.  Ost 1st Mrg lesion is 70% stenosed.  A drug-eluting stent was successfully placed using a STENT SIERRA 3.50 X 15 MM.  Post intervention, there is a 0% residual stenosis.  Post  intervention, there  is a 0% residual stenosis.  A drug-eluting stent was successfully placed using a STENT SIERRA 3.00 X 15 MM.  LV end diastolic pressure is normal.   1. 3 vessel obstructive CAD.     - focal 95% proximal LAD    - 70% OM1    - 90% focal distal RCA 2. Normal LVEDP 3. Successful PCI of the Distal RCA with DES x 1 4. Successful PCI of the proximal LAD with DES x 1  Plan: observe overnight. May resume coumadin tonight per pharmacy. Since she is on DAPT I would not bridge with Lovenox. Anticipate DC in am. She is a candidate for the Xience 28 study and if enrolled this will determine her antiplatelet strategy.  Recommend to resume Warfarin, at currently prescribed dose and frequency, on 02/26/18.  Recommend concurrent antiplatelet therapy of Aspirin 85m daily for 1 month and Clopidogrel 758mdaily for 6 months.  Assessment:    1. Coronary artery disease involving native coronary artery of native heart without angina pectoris   2. Essential hypertension   3. Hyperlipidemia LDL goal <70   4. TIA (transient ischemic attack)      Plan:   In order of problems listed above:  1. CAD - recent catheterization showed three-vessel CAD with 95% proximal LAD stenosis, 90% distal RCA stenosis, and 70% OM1 stenosis. She is s/p PCI with DES x1 to the distal RCA and DES x1 to the proximal LAD. Reports her left arm pain has resolved and her dyspnea on exertion has significantly improved. Denies any recent chest pain.   - She is greater than 1 month out from her procedure, therefore we will stop ASA as previously outlined by Dr. JoMartiniqueWill continue on Plavix for 6 months given the indication for Coumadin as well. Continue BB therapy and plan to reinitiate statin therapy as outlined below.    2. HTN - BP is well controlled at 122/70 during today's visit. - Continue Amlodipine 10 mg daily, Losartan 100 mg daily, and Toprol-XL 25 mg daily.  3. HLD - followed by PCP. She has been  intolerant to Atorvastatin, Simvastatin, and Zetia in the past due to myalgias. She wishes to avoid any injectable medications as she had difficulty with Lovenox injections leading up to her catheterization. [facility] she is in agreement to starting Crestor. Will start Crestor at 5 mg once weekly. Recheck FLP and LFT's in 6 to 8 weeks. If tolerating well and not yet at goal, can titrate to every other day.  4. History of TIA's - She has remained on Coumadin for anticoagulation which is followed by the Coumadin clinic. Denies any evidence of active bleeding.   Medication Adjustments/Labs and Tests Ordered: Current medicines are reviewed at length with the patient today.  Concerns regarding medicines are outlined above.  Medication changes, Labs and Tests ordered today are listed in the Patient Instructions below. Patient Instructions  Medication Instructions:  Your physician recommends that you continue on your current medications as directed. Please refer to the Current Medication list given to you today.  Start Crestor 5 mg Weekly   If you need a refill on your cardiac medications before your next appointment, please call your pharmacy.   Lab work: NONE  If you have labs (blood work) drawn today and your tests are completely normal, you will receive your results only by: . Marland KitchenyChart Message (if you have MyChart) OR . A paper copy in the mail If you have  any lab test that is abnormal or we need to change your treatment, we will call you to review the results.  Testing/Procedures: NONE   Follow-Up: At [facility], you and your health needs are our priority.  As part of our continuing mission to provide you with exceptional heart care, we have created designated Provider Care Teams.  These Care Teams include your primary Cardiologist (physician) and Advanced Practice Providers (APPs -  Physician Assistants and Nurse Practitioners) who all work  together to provide you with the care you need, when you need it. You will need a follow up appointment in December. Please call our office 2 months in advance to schedule this appointment.  You may see Kate Sable, MD or one of the following Advanced Practice Providers on your designated Care Team:   Bernerd Pho, PA-C [facility]) . Ermalinda Barrios, PA-C ([place])  Any Other Special Instructions Will Be Listed Below (If Applicable). Thank you for choosing [facility]!      Signed, Erma Heritage, PA-C  04/02/2018 5:47 PM    [place] [address] [place], [place] [address] Phone: [phone]

## 2018-04-02 ENCOUNTER — Ambulatory Visit (INDEPENDENT_AMBULATORY_CARE_PROVIDER_SITE_OTHER): Payer: Medicare Other | Admitting: *Deleted

## 2018-04-02 ENCOUNTER — Ambulatory Visit (INDEPENDENT_AMBULATORY_CARE_PROVIDER_SITE_OTHER): Payer: Medicare Other | Admitting: Student

## 2018-04-02 ENCOUNTER — Encounter: Payer: Self-pay | Admitting: Student

## 2018-04-02 VITALS — BP 122/70 | HR 94 | Ht 64.0 in | Wt 166.8 lb

## 2018-04-02 DIAGNOSIS — I10 Essential (primary) hypertension: Secondary | ICD-10-CM

## 2018-04-02 DIAGNOSIS — I6523 Occlusion and stenosis of bilateral carotid arteries: Secondary | ICD-10-CM | POA: Diagnosis not present

## 2018-04-02 DIAGNOSIS — Z7901 Long term (current) use of anticoagulants: Secondary | ICD-10-CM | POA: Diagnosis not present

## 2018-04-02 DIAGNOSIS — E785 Hyperlipidemia, unspecified: Secondary | ICD-10-CM

## 2018-04-02 DIAGNOSIS — G459 Transient cerebral ischemic attack, unspecified: Secondary | ICD-10-CM

## 2018-04-02 DIAGNOSIS — I2510 Atherosclerotic heart disease of native coronary artery without angina pectoris: Secondary | ICD-10-CM

## 2018-04-02 LAB — POCT INR: INR: 2.6 (ref 2.0–3.0)

## 2018-04-02 MED ORDER — ROSUVASTATIN CALCIUM 5 MG PO TABS: 5.0000 mg | ORAL_TABLET | ORAL | 3 refills | Status: DC

## 2018-04-02 MED ORDER — WARFARIN SODIUM 5 MG PO TABS: ORAL_TABLET | ORAL | 3 refills | Status: DC

## 2018-04-02 MED ORDER — CLOPIDOGREL BISULFATE 75 MG PO TABS: 75.0000 mg | ORAL_TABLET | Freq: Every day | ORAL | 3 refills | Status: DC

## 2018-04-02 MED ORDER — METOPROLOL SUCCINATE ER 25 MG PO TB24: 25.0000 mg | ORAL_TABLET | Freq: Every day | ORAL | 3 refills | Status: DC

## 2018-04-02 MED ORDER — PANTOPRAZOLE SODIUM 40 MG PO TBEC: 40.0000 mg | DELAYED_RELEASE_TABLET | Freq: Every day | ORAL | 3 refills | Status: AC

## 2018-04-02 NOTE — Patient Instructions (Signed)
Medication Instructions:  Your physician recommends that you continue on your current medications as directed. Please refer to the Current Medication list given to you today.  Start Crestor 5 mg Weekly   If you need a refill on your cardiac medications before your next appointment, please call your pharmacy.   Lab work: NONE  If you have labs (blood work) drawn today and your tests are completely normal, you will receive your results only by: Marland Kitchen MyChart Message (if you have MyChart) OR . A paper copy in the mail If you have any lab test that is abnormal or we need to change your treatment, we will call you to review the results.  Testing/Procedures: NONE   Follow-Up: At [facility], you and your health needs are our priority.  As part of our continuing mission to provide you with exceptional heart care, we have created designated Provider Care Teams.  These Care Teams include your primary Cardiologist (physician) and Advanced Practice Providers (APPs -  Physician Assistants and Nurse Practitioners) who all work together to provide you with the care you need, when you need it. You will need a follow up appointment in December. Please call our office 2 months in advance to schedule this appointment.  You may see Kate Sable, MD or one of the following Advanced Practice Providers on your designated Care Team:   Bernerd Pho, PA-C [facility]) . Ermalinda Barrios, PA-C ([place])  Any Other Special Instructions Will Be Listed Below (If Applicable). Thank you for choosing [facility]!

## 2018-04-02 NOTE — Patient Instructions (Signed)
Continue coumadin 1 1/2 tablets daily except 1 tablet on Sundays and Wednesdays Recheck in 4 wks

## 2018-05-07 ENCOUNTER — Encounter (HOSPITAL_COMMUNITY): Payer: Self-pay

## 2018-05-07 ENCOUNTER — Emergency Department (HOSPITAL_COMMUNITY): Payer: Medicare Other

## 2018-05-07 ENCOUNTER — Observation Stay (HOSPITAL_COMMUNITY): Payer: Medicare Other

## 2018-05-07 ENCOUNTER — Other Ambulatory Visit: Payer: Self-pay

## 2018-05-07 ENCOUNTER — Inpatient Hospital Stay (HOSPITAL_COMMUNITY)
Admission: EM | Admit: 2018-05-07 | Discharge: 2018-05-10 | DRG: 065 | Disposition: A | Discharge status: Skilled Nursing Facility | Payer: Medicare Other | Attending: Family Medicine | Admitting: Family Medicine

## 2018-05-07 DIAGNOSIS — Z79899 Other long term (current) drug therapy: Secondary | ICD-10-CM | POA: Diagnosis not present

## 2018-05-07 DIAGNOSIS — I10 Essential (primary) hypertension: Secondary | ICD-10-CM | POA: Diagnosis present

## 2018-05-07 DIAGNOSIS — Z955 Presence of coronary angioplasty implant and graft: Secondary | ICD-10-CM

## 2018-05-07 DIAGNOSIS — R531 Weakness: Secondary | ICD-10-CM | POA: Diagnosis present

## 2018-05-07 DIAGNOSIS — Z91041 Radiographic dye allergy status: Secondary | ICD-10-CM | POA: Diagnosis not present

## 2018-05-07 DIAGNOSIS — E1169 Type 2 diabetes mellitus with other specified complication: Secondary | ICD-10-CM | POA: Diagnosis present

## 2018-05-07 DIAGNOSIS — R29708 NIHSS score 8: Secondary | ICD-10-CM | POA: Diagnosis present

## 2018-05-07 DIAGNOSIS — Z888 Allergy status to other drugs, medicaments and biological substances status: Secondary | ICD-10-CM | POA: Diagnosis not present

## 2018-05-07 DIAGNOSIS — I2510 Atherosclerotic heart disease of native coronary artery without angina pectoris: Secondary | ICD-10-CM | POA: Diagnosis present

## 2018-05-07 DIAGNOSIS — Z8673 Personal history of transient ischemic attack (TIA), and cerebral infarction without residual deficits: Secondary | ICD-10-CM

## 2018-05-07 DIAGNOSIS — Z7902 Long term (current) use of antithrombotics/antiplatelets: Secondary | ICD-10-CM | POA: Diagnosis not present

## 2018-05-07 DIAGNOSIS — I361 Nonrheumatic tricuspid (valve) insufficiency: Secondary | ICD-10-CM

## 2018-05-07 DIAGNOSIS — G9389 Other specified disorders of brain: Secondary | ICD-10-CM | POA: Diagnosis present

## 2018-05-07 DIAGNOSIS — Z882 Allergy status to sulfonamides status: Secondary | ICD-10-CM | POA: Diagnosis not present

## 2018-05-07 DIAGNOSIS — I63531 Cerebral infarction due to unspecified occlusion or stenosis of right posterior cerebral artery: Secondary | ICD-10-CM | POA: Diagnosis present

## 2018-05-07 DIAGNOSIS — Z880 Allergy status to penicillin: Secondary | ICD-10-CM | POA: Diagnosis not present

## 2018-05-07 DIAGNOSIS — K760 Fatty (change of) liver, not elsewhere classified: Secondary | ICD-10-CM | POA: Diagnosis present

## 2018-05-07 DIAGNOSIS — K219 Gastro-esophageal reflux disease without esophagitis: Secondary | ICD-10-CM | POA: Diagnosis present

## 2018-05-07 DIAGNOSIS — E119 Type 2 diabetes mellitus without complications: Secondary | ICD-10-CM

## 2018-05-07 DIAGNOSIS — I639 Cerebral infarction, unspecified: Secondary | ICD-10-CM | POA: Diagnosis present

## 2018-05-07 DIAGNOSIS — R600 Localized edema: Secondary | ICD-10-CM

## 2018-05-07 DIAGNOSIS — E7800 Pure hypercholesterolemia, unspecified: Secondary | ICD-10-CM | POA: Diagnosis present

## 2018-05-07 DIAGNOSIS — Z7901 Long term (current) use of anticoagulants: Secondary | ICD-10-CM | POA: Diagnosis not present

## 2018-05-07 DIAGNOSIS — G8194 Hemiplegia, unspecified affecting left nondominant side: Secondary | ICD-10-CM | POA: Diagnosis present

## 2018-05-07 DIAGNOSIS — Z87891 Personal history of nicotine dependence: Secondary | ICD-10-CM | POA: Diagnosis not present

## 2018-05-07 DIAGNOSIS — E785 Hyperlipidemia, unspecified: Secondary | ICD-10-CM | POA: Diagnosis present

## 2018-05-07 DIAGNOSIS — G459 Transient cerebral ischemic attack, unspecified: Secondary | ICD-10-CM

## 2018-05-07 DIAGNOSIS — R29810 Facial weakness: Secondary | ICD-10-CM | POA: Diagnosis present

## 2018-05-07 DIAGNOSIS — Z7984 Long term (current) use of oral hypoglycemic drugs: Secondary | ICD-10-CM | POA: Diagnosis not present

## 2018-05-07 LAB — GLUCOSE, CAPILLARY
GLUCOSE-CAPILLARY: 169 mg/dL — AB (ref 70–99)
Glucose-Capillary: 113 mg/dL — ABNORMAL HIGH (ref 70–99)
Glucose-Capillary: 112 mg/dL — ABNORMAL HIGH (ref 70–99)
Glucose-Capillary: 121 mg/dL — ABNORMAL HIGH (ref 70–99)

## 2018-05-07 LAB — URINALYSIS, ROUTINE W REFLEX MICROSCOPIC
Bilirubin Urine: NEGATIVE
Glucose, UA: NEGATIVE mg/dL
Hgb urine dipstick: NEGATIVE
Ketones, ur: NEGATIVE mg/dL
Leukocytes, UA: NEGATIVE
Nitrite: NEGATIVE
PH: 7 (ref 5.0–8.0)
Protein, ur: NEGATIVE mg/dL
Specific Gravity, Urine: 1.005 (ref 1.005–1.030)

## 2018-05-07 LAB — COMPREHENSIVE METABOLIC PANEL
ALK PHOS: 113 U/L (ref 38–126)
ALT: 18 U/L (ref 0–44)
AST: 18 U/L (ref 15–41)
Albumin: 4.4 g/dL (ref 3.5–5.0)
Anion gap: 9 (ref 5–15)
BUN: 11 mg/dL (ref 8–23)
CO2: 27 mmol/L (ref 22–32)
CREATININE: 0.58 mg/dL (ref 0.44–1.00)
Calcium: 9.8 mg/dL (ref 8.9–10.3)
Chloride: 105 mmol/L (ref 98–111)
GFR calc Af Amer: >60 mL/min (ref >60)
GFR calc non Af Amer: >60 mL/min (ref >60)
GLUCOSE: 156 mg/dL — AB (ref 70–99)
Potassium: 4 mmol/L (ref 3.5–5.1)
Sodium: 141 mmol/L (ref 135–145)
Total Bilirubin: 0.4 mg/dL (ref 0.3–1.2)
Total Protein: 8.1 g/dL (ref 6.5–8.1)

## 2018-05-07 LAB — ECHOCARDIOGRAM COMPLETE
Height: 64 in
Weight: 2578.5 oz

## 2018-05-07 LAB — CBC
HCT: 43.7 % (ref 36.0–46.0)
Hemoglobin: 14.1 g/dL (ref 12.0–15.0)
MCH: 30.1 pg (ref 26.0–34.0)
MCHC: 32.3 g/dL (ref 30.0–36.0)
MCV: 93.4 fL (ref 80.0–100.0)
Platelets: 213 10*3/uL (ref 150–400)
RBC: 4.68 MIL/uL (ref 3.87–5.11)
RDW: 11.9 % (ref 11.5–15.5)
WBC: 8.7 10*3/uL (ref 4.0–10.5)
nRBC: 0 % (ref 0.0–0.2)

## 2018-05-07 LAB — PROTIME-INR
INR: 2.16
Prothrombin Time: 23.8 seconds — ABNORMAL HIGH (ref 11.4–15.2)

## 2018-05-07 LAB — I-STAT CHEM 8, ED
BUN: 11 mg/dL (ref 8–23)
Calcium, Ion: 1.24 mmol/L (ref 1.15–1.40)
Chloride: 104 mmol/L (ref 98–111)
Creatinine, Ser: 0.5 mg/dL (ref 0.44–1.00)
Glucose, Bld: 152 mg/dL — ABNORMAL HIGH (ref 70–99)
HCT: 43 % (ref 36.0–46.0)
HEMOGLOBIN: 14.6 g/dL (ref 12.0–15.0)
Potassium: 3.8 mmol/L (ref 3.5–5.1)
Sodium: 142 mmol/L (ref 135–145)
TCO2: 33 mmol/L — ABNORMAL HIGH (ref 22–32)

## 2018-05-07 LAB — RAPID URINE DRUG SCREEN, HOSP PERFORMED
Amphetamines: NOT DETECTED
Barbiturates: NOT DETECTED
Benzodiazepines: NOT DETECTED
Cocaine: NOT DETECTED
OPIATES: NOT DETECTED
Tetrahydrocannabinol: NOT DETECTED

## 2018-05-07 LAB — DIFFERENTIAL
Abs Immature Granulocytes: 0.02 10*3/uL (ref 0.00–0.07)
Basophils Absolute: 0.1 10*3/uL (ref 0.0–0.1)
Basophils Relative: 1 %
Eosinophils Absolute: 0.1 10*3/uL (ref 0.0–0.5)
Eosinophils Relative: 1 %
Immature Granulocytes: 0 %
Lymphocytes Relative: 20 %
Lymphs Abs: 1.7 10*3/uL (ref 0.7–4.0)
MONOS PCT: 8 %
Monocytes Absolute: 0.7 10*3/uL (ref 0.1–1.0)
NEUTROS ABS: 6.2 10*3/uL (ref 1.7–7.7)
Neutrophils Relative %: 70 %

## 2018-05-07 LAB — MAGNESIUM: MAGNESIUM: 1.6 mg/dL — AB (ref 1.7–2.4)

## 2018-05-07 LAB — I-STAT TROPONIN, ED: Troponin i, poc: 0 ng/mL (ref 0.00–0.08)

## 2018-05-07 LAB — APTT: aPTT: 38 seconds — ABNORMAL HIGH (ref 24–36)

## 2018-05-07 LAB — HEMOGLOBIN A1C
Hgb A1c MFr Bld: 6.3 % — ABNORMAL HIGH (ref 4.8–5.6)
Mean Plasma Glucose: 134.11 mg/dL

## 2018-05-07 LAB — MRSA PCR SCREENING: MRSA by PCR: NEGATIVE

## 2018-05-07 LAB — PHOSPHORUS: Phosphorus: 3.1 mg/dL (ref 2.5–4.6)

## 2018-05-07 LAB — ETHANOL: Alcohol, Ethyl (B): <10 mg/dL (ref <10)

## 2018-05-07 MED ORDER — POLYVINYL ALCOHOL 1.4 % OP SOLN: 1.0000 [drp] | Freq: Two times a day (BID) | OPHTHALMIC | Status: DC | PRN

## 2018-05-07 MED ORDER — ROSUVASTATIN CALCIUM 10 MG PO TABS
5.0000 mg | ORAL_TABLET | ORAL | Status: DC
  Administered 2018-05-07: 5 mg via ORAL
  Filled 2018-05-07: qty 1

## 2018-05-07 MED ORDER — NITROGLYCERIN 0.4 MG SL SUBL: 0.4000 mg | SUBLINGUAL_TABLET | SUBLINGUAL | Status: DC | PRN

## 2018-05-07 MED ORDER — MAGNESIUM SULFATE 2 GM/50ML IV SOLN
2.0000 g | Freq: Once | INTRAVENOUS | Status: AC
  Administered 2018-05-07: 2 g via INTRAVENOUS
  Filled 2018-05-07: qty 50

## 2018-05-07 MED ORDER — CLOPIDOGREL BISULFATE 75 MG PO TABS
75.0000 mg | ORAL_TABLET | Freq: Every day | ORAL | Status: DC
  Administered 2018-05-07 – 2018-05-10 (×4): 75 mg via ORAL
  Filled 2018-05-07 (×4): qty 1

## 2018-05-07 MED ORDER — LORATADINE 10 MG PO TABS
10.0000 mg | ORAL_TABLET | Freq: Every day | ORAL | Status: DC
  Administered 2018-05-08 – 2018-05-10 (×3): 10 mg via ORAL
  Filled 2018-05-07 (×4): qty 1

## 2018-05-07 MED ORDER — WARFARIN SODIUM 7.5 MG PO TABS
7.5000 mg | ORAL_TABLET | Freq: Once | ORAL | Status: AC
  Administered 2018-05-07: 7.5 mg via ORAL
  Filled 2018-05-07: qty 1

## 2018-05-07 MED ORDER — INSULIN ASPART 100 UNIT/ML ~~LOC~~ SOLN
0.0000 [IU] | Freq: Three times a day (TID) | SUBCUTANEOUS | Status: DC
  Administered 2018-05-07 – 2018-05-08 (×2): 2 [IU] via SUBCUTANEOUS
  Administered 2018-05-08: 1 [IU] via SUBCUTANEOUS
  Administered 2018-05-08 – 2018-05-09 (×2): 2 [IU] via SUBCUTANEOUS
  Administered 2018-05-09 (×2): 1 [IU] via SUBCUTANEOUS
  Administered 2018-05-10: 3 [IU] via SUBCUTANEOUS
  Administered 2018-05-10: 1 [IU] via SUBCUTANEOUS

## 2018-05-07 MED ORDER — ASPIRIN 300 MG RE SUPP
300.0000 mg | Freq: Once | RECTAL | Status: AC
  Administered 2018-05-07: 300 mg via RECTAL
  Filled 2018-05-07: qty 1

## 2018-05-07 MED ORDER — DOCUSATE SODIUM 100 MG PO CAPS: 200.0000 mg | ORAL_CAPSULE | Freq: Two times a day (BID) | ORAL | Status: DC | PRN

## 2018-05-07 MED ORDER — ADULT MULTIVITAMIN W/MINERALS CH
1.0000 | ORAL_TABLET | Freq: Every day | ORAL | Status: DC
  Administered 2018-05-07 – 2018-05-10 (×4): 1 via ORAL
  Filled 2018-05-07 (×4): qty 1

## 2018-05-07 MED ORDER — FLUTICASONE PROPIONATE 50 MCG/ACT NA SUSP
2.0000 | Freq: Every day | NASAL | Status: DC
  Administered 2018-05-07 – 2018-05-10 (×3): 2 via NASAL
  Filled 2018-05-07 (×2): qty 16

## 2018-05-07 MED ORDER — PANTOPRAZOLE SODIUM 40 MG PO TBEC
40.0000 mg | DELAYED_RELEASE_TABLET | Freq: Every day | ORAL | Status: DC
  Administered 2018-05-07 – 2018-05-10 (×4): 40 mg via ORAL
  Filled 2018-05-07 (×4): qty 1

## 2018-05-07 MED ORDER — WARFARIN - PHARMACIST DOSING INPATIENT
Freq: Every day | Status: DC
  Administered 2018-05-07: 16:00:00

## 2018-05-07 MED ORDER — STROKE: EARLY STAGES OF RECOVERY BOOK
Freq: Once | Status: AC
  Administered 2018-05-07: 13:00:00
  Filled 2018-05-07 (×2): qty 1

## 2018-05-07 MED ORDER — URSODIOL 300 MG PO CAPS
300.0000 mg | ORAL_CAPSULE | Freq: Two times a day (BID) | ORAL | Status: DC
  Administered 2018-05-07 – 2018-05-10 (×6): 300 mg via ORAL
  Filled 2018-05-07 (×12): qty 1

## 2018-05-07 NOTE — Consult Note (Signed)
Ana A. Merlene Laughter, MD     [email or web address]          [name] is an 84 y.o. female.   ASSESSMENT/PLAN: 1.  Acute changes in mentation/altered mental status and acute left-sided weakness: The findings are due to acute ischemic stroke.  Risk factors include age, hypertension, diabetes and previous TIA.  The patient does have an occluded right PCA which likely corresponds to the patient's infarct suggesting intracranial occlusive disease.  The patient should be treated will dual antiplatelet agents and a statin medication.  This is the standard treatment.  She currently is on Plavix.  I would suggest we add aspirin 3 days from now once the anticoagulation has been reversed spontaneously.  The patient has been placed on lifelong anticoagulation but there is no indication that the patient has atrial fibrillation.  Anticoagulation increases risk of hemorrhage without additional benefit over antiplatelet agents unless there is clear indication of the patient has atrial fibrillation.  I will therefore discontinue anticoagulation.  A 30-day event monitor will be obtained to evaluate for possible atrial fibrillation.  While the patient has had a history of PFO in the past, recent echo does not suggest this.  In that situation, antiplatelet agents are typically suggested in this age group for PFO's.  It may be worthwhile to obtain Dopplers of the leg to evaluate for any DVT of the legs.        Patient is a 84 year old white female who presents with acute onset altered mental status and left-sided weakness.  The patient cannot give a lot of history and seems confused as far as the details is why she is in the hospital.  She reports however that she continues to have left-sided weakness.  She does not recall coming to the hospital or why she is here.  She does not report any complaints at this time.  She does not report having difficulty speaking.  She tells me that she has had a  few TIAs in the past and was placed on lifelong anticoagulation by her cardiologist.  There is no evidence of atrial fibrillation documented in the charts.  The review of systems is limited but otherwise negative.     GENERAL: This is a pleasant thin female in no acute distress.  HEENT:  This is norma.l  ABDOMEN: soft  EXTREMITIES: No edema   BACK: Normal  SKIN: There is extensive hyperpigmented papules and macules/moles involving the upper and lower extremities.    MENTAL STATUS: Alert and oriented -including orientation to year, month and her age. Speech, language and cognition are generally intact. Judgment and insight normal.   CRANIAL NERVES: Pupils are equal, round and reactive to light and accomodation; extra ocular movements are full, there is no significant nystagmus; visual fields dense left homonymous hemianopia; there is mild flattening of the nasolabial fold on the left side otherwise facial muscles are symmetric; tongue is midline; uvula is midline; shoulder elevation is normal.  MOTOR: There is mild drift of the left upper extremity.  There is also more profound drift of the legs bilaterally.  Upper extremity strength is normal on the right.  The left deltoid and triceps are 4/5.  Handgrip 5.  Right hip flexion 4+ and left 4.  Dorsiflexion 5 bilaterally.  COORDINATION: Left finger to nose is normal, right finger to nose is normal, No rest tremor; no intention tremor; no postural tremor; no bradykinesia.  REFLEXES: Deep tendon reflexes are symmetrical and normal. Plantar reflexes are  flexor on the right and extensor on the left.  SENSATION: Normal to light touch, temperature, and pain.  She does not extinguish to double simultaneous stimulation.    NIH stroke scale 2, 1, 1, 2, 2 total equal 8.     CARDIOLOGY NOTE 05-2017 1.  Recurrent TIAs: On lifelong anticoagulation with warfarin.  Blood pressure is reasonably controlled.  No changes to therapy.  2.  PFO:  Previously identified by saline contrast study on a limited echo. There was right-to-left shunting and also some evidence of a right ventricular aneurysm. Her most recent echo did not comment on this, but did mention normal size and function.   3.  Coronary disease: Nonobstructive disease with a negative stress perfusion study in September 2012.  Asymptomatic.  No changes to therapy.  4.  Chronic hypertension: Blood pressure is reasonably controlled.  No changes to therapy.  5.  Bilateral carotid artery disease: Results from November 2017 detailed above.  I will repeat Dopplers in 1 year.         Blood pressure 131/65, pulse 94, temperature (!) 97.3 F (36.3 C), temperature source Oral, resp. rate 18, height 5' 4"  (1.626 m), weight 73.1 kg, SpO2 94 %.  Past Medical History:  Diagnosis Date  . Allergic rhinitis   . Arthritis    "probably in my hands" (02/26/2018)  . Colonic polyp   . Coronary artery disease    a. s/p cath on 02/26/2018 with 3-vessel CAD with 95% proximal LAD, 90% distal RCA, and 70% OM1 stenosis.  She underwent successful PCI with DES x1 to the distal RCA and DES x1 to the proximal LAD  . GERD (gastroesophageal reflux disease)   . History of blood transfusion    "don't remember why or when" (02/26/2018)  . Hyperlipidemia   . Hypertension   . Nonalcoholic hepatosteatosis   . TIA (transient ischemic attack) 2000s   "I've had ~ 3" (02/26/2018)  . Type II diabetes mellitus ([place])     Past Surgical History:  Procedure Laterality Date  . ABDOMINAL HYSTERECTOMY    . APPENDECTOMY  1950s  . CARDIAC CATHETERIZATION  2001   Nonobstructive CAD  . CATARACT EXTRACTION W/ INTRAOCULAR LENS  IMPLANT, BILATERAL Bilateral 2000s  . CORONARY STENT INTERVENTION N/A 02/26/2018   Procedure: CORONARY STENT INTERVENTION;  Surgeon: Martinique, Peter M, MD;  Location: [facility];  Service: Cardiovascular;  Laterality: N/A;  rca   . LEFT HEART CATH AND CORONARY ANGIOGRAPHY N/A  02/26/2018   Procedure: LEFT HEART CATH AND CORONARY ANGIOGRAPHY;  Surgeon: Martinique, Peter M, MD;  Location: [facility];  Service: Cardiovascular;  Laterality: N/A;  . TONSILLECTOMY  1943    Family History  Problem Relation Age of Onset  . Heart failure Mother   . Prostate cancer Father   . Prostate cancer Brother     Social History:  reports that she quit smoking about 25 years ago. Her smoking use included cigarettes. She has a 80.00 pack-year smoking history. She quit smokeless tobacco use about 25 years ago.  Her smokeless tobacco use included chew. She reports that she does not drink alcohol or use drugs.  Allergies:  Allergies  Allergen Reactions  . Ivp Dye [Iodinated Diagnostic Agents] Anaphylaxis  . Sulfonamide Derivatives Shortness Of Breath  . Lipitor [Atorvastatin Calcium]     Muscle Aches  . Zetia [Ezetimibe]     Muscle Aches  . Penicillins Rash    As a teenager. Has patient had a PCN  reaction causing immediate rash, facial/tongue/throat swelling, SOB or lightheadedness with hypotension: No Has patient had a PCN reaction causing severe rash involving mucus membranes or skin necrosis: No Has patient had a PCN reaction that required hospitalization: No Has patient had a PCN reaction occurring within the last 10 years: No If all of the above answers are "NO", then may proceed with Cephalosporin use.     Medications: Prior to Admission medications   Medication Sig Start Date End Date Taking? Authorizing Provider  amLODipine (NORVASC) 10 MG tablet Take 1 tablet (10 mg total) by mouth daily. 01/29/18 05/07/18 Yes Strader, Fransisco Hertz, PA-C  carboxymethylcellulose (REFRESH) 1 % ophthalmic solution Place 1 drop into both eyes 2 (two) times daily as needed (dry eyes).    Yes [provider]  clopidogrel (PLAVIX) 75 MG tablet Take 1 tablet (75 mg total) by mouth daily with breakfast. 04/02/18  Yes Strader, [place] M, PA-C  docusate sodium (COLACE) 100 MG capsule  Take 200 mg by mouth 2 (two) times daily as needed for mild constipation.    Yes [provider]  fexofenadine (ALLEGRA) 180 MG tablet Take 180 mg by mouth daily.     Yes [provider]  fluticasone (FLONASE) 50 MCG/ACT nasal spray Place 2 sprays into both nostrils daily.    Yes [provider]  glipiZIDE (GLUCOTROL XL) 2.5 MG 24 hr tablet Take 2.5 mg by mouth daily with breakfast.    Yes [provider]  losartan (COZAAR) 100 MG tablet Take 100 mg by mouth daily.   Yes [provider]  metFORMIN (GLUCOPHAGE-XR) 500 MG 24 hr tablet Take 500 mg by mouth daily with breakfast.     Yes [provider]  metoprolol succinate (TOPROL-XL) 25 MG 24 hr tablet Take 1 tablet (25 mg total) by mouth daily. 04/02/18  Yes Strader, [place], PA-C  Multiple Vitamin (MULTIVITAMIN) tablet Take 1 tablet by mouth daily.     Yes [provider]  nitroGLYCERIN (NITROSTAT) 0.4 MG SL tablet Place 0.4 mg under the tongue every 5 (five) minutes as needed for chest pain.   Yes de [place], MD  pantoprazole (PROTONIX) 40 MG tablet Take 1 tablet (40 mg total) by mouth daily. 04/02/18  Yes Strader, [place], PA-C  rosuvastatin (CRESTOR) 5 MG tablet Take 1 tablet (5 mg total) by mouth once a week. 04/02/18 07/01/18 Yes Strader, Fransisco Hertz, PA-C  ursodiol (ACTIGALL) 300 MG capsule Take 300 mg by mouth 2 (two) times daily.    Yes [provider]  vitamin E (VITAMIN E) 400 UNIT capsule Take 400 Units by mouth 2 (two) times daily.    Yes [provider]  warfarin (JANTOVEN) 5 MG tablet Take coumadin 1 1/2 tablets daily except 1 tablet on Sundays and Wednesdays 04/02/18  Yes Strader, [place] M, PA-C    Scheduled Meds: . insulin aspart  0-9 Units Subcutaneous TID WC  . warfarin  7.5 mg Oral Once  . Warfarin - Pharmacist Dosing Inpatient   Does not apply q1800   Continuous Infusions: PRN Meds:.     Results for orders placed or performed  during the hospital encounter of 05/07/18 (from the past 48 hour(s))  Urine rapid drug screen (hosp performed)     Status: None   Collection Time: 05/07/18  2:13 AM  Result Value Ref Range   Opiates NONE DETECTED NONE DETECTED   Cocaine NONE DETECTED NONE DETECTED   Benzodiazepines NONE DETECTED NONE DETECTED   Amphetamines  NONE DETECTED NONE DETECTED   Tetrahydrocannabinol NONE DETECTED NONE DETECTED   Barbiturates NONE DETECTED NONE DETECTED    Comment: (NOTE) DRUG SCREEN FOR MEDICAL PURPOSES ONLY.  IF CONFIRMATION IS NEEDED FOR ANY PURPOSE, NOTIFY LAB WITHIN 5 DAYS. LOWEST DETECTABLE LIMITS FOR URINE DRUG SCREEN Drug Class                     Cutoff (ng/mL) Amphetamine and metabolites    1000 Barbiturate and metabolites    200 Benzodiazepine                 841 Tricyclics and metabolites     300 Opiates and metabolites        300 Cocaine and metabolites        300 THC                            50 Performed at [facility], [address], [place], [place] [address]   Urinalysis, Routine w reflex microscopic     Status: Abnormal   Collection Time: 05/07/18  2:13 AM  Result Value Ref Range   Color, Urine STRAW (A) YELLOW   APPearance CLEAR CLEAR   Specific Gravity, Urine 1.005 1.005 - 1.030   pH 7.0 5.0 - 8.0   Glucose, UA NEGATIVE NEGATIVE mg/dL   Hgb urine dipstick NEGATIVE NEGATIVE   Bilirubin Urine NEGATIVE NEGATIVE   Ketones, ur NEGATIVE NEGATIVE mg/dL   Protein, ur NEGATIVE NEGATIVE mg/dL   Nitrite NEGATIVE NEGATIVE   Leukocytes, UA NEGATIVE NEGATIVE    Comment: Performed at [facility], [address] [place], [place] [address]  Ethanol     Status: None   Collection Time: 05/07/18  2:58 AM  Result Value Ref Range   Alcohol, Ethyl (B) <10 <10 mg/dL    Comment: (NOTE) Lowest detectable limit for serum alcohol is 10 mg/dL. For medical purposes only. Performed at [facility], [address] [place], [place] [address]   Protime-INR     Status: Abnormal    Collection Time: 05/07/18  2:58 AM  Result Value Ref Range   Prothrombin Time 23.8 (H) 11.4 - 15.2 seconds   INR 2.16     Comment: Performed at [facility], [address] [place], [place] [address]  APTT     Status: Abnormal   Collection Time: 05/07/18  2:58 AM  Result Value Ref Range   aPTT 38 (H) 24 - 36 seconds    Comment:        IF BASELINE aPTT IS ELEVATED, SUGGEST PATIENT RISK ASSESSMENT BE USED TO DETERMINE APPROPRIATE ANTICOAGULANT THERAPY. Performed at [facility], [address] [place], [place] [address]   CBC     Status: None   Collection Time: 05/07/18  2:58 AM  Result Value Ref Range   WBC 8.7 4.0 - 10.5 K/uL   RBC 4.68 3.87 - 5.11 MIL/uL   Hemoglobin 14.1 12.0 - 15.0 g/dL   HCT 43.7 36.0 - 46.0 %   MCV 93.4 80.0 - 100.0 fL   MCH 30.1 26.0 - 34.0 pg   MCHC 32.3 30.0 - 36.0 g/dL   RDW 11.9 11.5 - 15.5 %   Platelets 213 150 - 400 K/uL   nRBC 0.0 0.0 - 0.2 %    Comment: Performed at [facility], [address], [place], [place] [address]  Differential     Status: None   Collection Time: 05/07/18  2:58  AM  Result Value Ref Range   Neutrophils Relative % 70 %   Neutro Abs 6.2 1.7 - 7.7 K/uL   Lymphocytes Relative 20 %   Lymphs Abs 1.7 0.7 - 4.0 K/uL   Monocytes Relative 8 %   Monocytes Absolute 0.7 0.1 - 1.0 K/uL   Eosinophils Relative 1 %   Eosinophils Absolute 0.1 0.0 - 0.5 K/uL   Basophils Relative 1 %   Basophils Absolute 0.1 0.0 - 0.1 K/uL   Immature Granulocytes 0 %   Abs Immature Granulocytes 0.02 0.00 - 0.07 K/uL    Comment: Performed at [facility], [address] [place], [place] [address]  Comprehensive metabolic panel     Status: Abnormal   Collection Time: 05/07/18  2:58 AM  Result Value Ref Range   Sodium 141 135 - 145 mmol/L   Potassium 4.0 3.5 - 5.1 mmol/L   Chloride 105 98 - 111 mmol/L   CO2 27 22 - 32 mmol/L   Glucose, Bld 156 (H) 70 - 99 mg/dL   BUN 11 8 - 23 mg/dL   Creatinine, Ser 0.58 0.44 - 1.00 mg/dL   Calcium 9.8 8.9 - 10.3  mg/dL   Total Protein 8.1 6.5 - 8.1 g/dL   Albumin 4.4 3.5 - 5.0 g/dL   AST 18 15 - 41 U/L   ALT 18 0 - 44 U/L   Alkaline Phosphatase 113 38 - 126 U/L   Total Bilirubin 0.4 0.3 - 1.2 mg/dL   GFR calc non Af Amer >60 >60 mL/min   GFR calc Af Amer >60 >60 mL/min   Anion gap 9 5 - 15    Comment: Performed at [facility], [address], [place], [place] [address]  I-stat troponin, ED     Status: None   Collection Time: 05/07/18  3:06 AM  Result Value Ref Range   Troponin i, poc 0.00 0.00 - 0.08 ng/mL   Comment 3            Comment: Due to the release kinetics of cTnI, a negative result within the first hours of the onset of symptoms does not rule out myocardial infarction with certainty. If myocardial infarction is still suspected, repeat the test at appropriate intervals.   I-Stat Chem 8, ED     Status: Abnormal   Collection Time: 05/07/18  3:10 AM  Result Value Ref Range   Sodium 142 135 - 145 mmol/L   Potassium 3.8 3.5 - 5.1 mmol/L   Chloride 104 98 - 111 mmol/L   BUN 11 8 - 23 mg/dL   Creatinine, Ser 0.50 0.44 - 1.00 mg/dL   Glucose, Bld 152 (H) 70 - 99 mg/dL   Calcium, Ion 1.24 1.15 - 1.40 mmol/L   TCO2 33 (H) 22 - 32 mmol/L   Hemoglobin 14.6 12.0 - 15.0 g/dL   HCT 43.0 36.0 - 46.0 %  Phosphorus     Status: None   Collection Time: 05/07/18  3:47 AM  Result Value Ref Range   Phosphorus 3.1 2.5 - 4.6 mg/dL    Comment: Performed at [facility], [address] [place], [place] [address]  Magnesium     Status: Abnormal   Collection Time: 05/07/18  3:47 AM  Result Value Ref Range   Magnesium 1.6 (L) 1.7 - 2.4 mg/dL    Comment: Performed at [facility], [address] [place], [place] [address]  MRSA PCR Screening     Status: None   Collection Time: 05/07/18  4:40 AM  Result Value Ref Range   MRSA by PCR NEGATIVE NEGATIVE    Comment:        The GeneXpert MRSA Assay (FDA approved for NASAL specimens only), is one component of a comprehensive MRSA  colonization surveillance program. It is not intended to diagnose MRSA infection nor to guide or monitor treatment for MRSA infections. Performed at [facility], [address], [place], [place] [address]   Glucose, capillary     Status: Abnormal   Collection Time: 05/07/18  7:19 AM  Result Value Ref Range   Glucose-Capillary 113 (H) 70 - 99 mg/dL  Glucose, capillary     Status: Abnormal   Collection Time: 05/07/18 11:09 AM  Result Value Ref Range   Glucose-Capillary 112 (H) 70 - 99 mg/dL    Studies/Results:  BRAIN MRI MRA FINDINGS: MRI HEAD FINDINGS  Brain: Patchy restricted diffusion in the right occipital lobe, cortical to subcortical. There is a punctate left occipital and right posterior frontal white matter infarcts.  Wedge of restricted diffusion in the right pons respecting midline.  No hemorrhage, hydrocephalus, or masslike finding. Chronic small vessel ischemia with extensive ischemic gliosis in the cerebral white matter. Remote lacunar infarcts in the bilateral thalami and parietal white matter.  Vascular: Arterial findings below. Normal dural venous sinus flow voids.  Skull and upper cervical spine: No evidence of marrow lesion  Sinuses/Orbits: Bilateral cataract resection  MRA HEAD FINDINGS  Symmetric carotid and vertebral arteries. Proximal V4 segment symmetric poor flow signal attributed to dephasing artifact. The carotid, vertebral, and basilar arteries are widely patent. High-grade multi for narrowing of left M1, left A2, and right more than left proximal PCA. Negative for aneurysm.  IMPRESSION: Brain MRI:  1. Patchy acute infarct in the right occipital lobe. 2. Acute right pontine infarct. 3. Punctate white matter infarcts in the posterior right frontal and left occipital white matter. 4. Background of advanced chronic small vessel ischemia.  Intracranial MRA:  Advanced intracranial atherosclerosis with high-grade narrowings  at the left M1, left A2, and right P2/3 segments.    ECHO - Left ventricle: The cavity size was normal. Wall thickness was   increased in a pattern of mild LVH. Systolic function was normal.   The estimated ejection fraction was in the range of 60% to 65%.   Wall motion was normal; there were no regional wall motion   abnormalities. Doppler parameters are consistent with abnormal   left ventricular relaxation (grade 1 diastolic dysfunction). - Aortic valve: Moderately calcified annulus. Probably trileaflet;   moderately calcified leaflets. There was moderate stenosis. Mean   gradient (S): 11 mm Hg. Peak gradient (S): 21 mm Hg. VTI ratio of   LVOT to aortic valve: 0.44. Valve area (VTI): 1.24 cm^2. Valve   area (Vmax): 1.23 cm^2. - Mitral valve: Mildly to moderately calcified annulus. Mildly   thickened leaflets . There was trivial regurgitation. - Right atrium: Central venous pressure (est): 3 mm Hg. - Atrial septum: No defect or patent foramen ovale was identified. - Tricuspid valve: There was mild regurgitation. - Pulmonary arteries: PA peak pressure: 21 mm Hg (S). - Pericardium, extracardiac: A prominent pericardial fat pad was   present.      CAROTID IMPRESSION: Minor carotid atherosclerosis. No hemodynamically significant ICA stenosis. Degree of narrowing less than 50% bilaterally by ultrasound criteria.  Patent antegrade vertebral flow bilaterally     The brain MRI and MRA are reviewed in person.  There are multiple scattered dense and also  patchy increased signal seen on DWI.  Area involves the right temporal parietal and some degree of frontal area.  Parietal region approximates with the watershed area.  The occipital area is relatively preserved.  The most intense region involves the temporal region.  There is evidence of encephalomalacia involving the right atria.  This is a moderate size lesion greater than a lacunar infarct.  This consistent with a remote  infarct.  There is also a small area involving the frontal horn of the lateral ventricle also on the right side.  There is marked confluent and patchy plaque-like increased signal on FLAIR imaging that is nonspecific but given age likely due to chronic micro-ischemic changes.  There is also moderate atrophy.  No hemorrhages appreciated.  There is a dropout signal/cutoff of the right PCA.  There is also mild to moderate disease of the left PCA.  Other vessels are otherwise unrevealing.   Danicia Terhaar A. Merlene Bell, M.D.  Diplomate, Tax adviser of Psychiatry and Neurology ( Neurology). 05/07/2018, 2:58 PM

## 2018-05-07 NOTE — Evaluation (Signed)
Clinical/Bedside Swallow Evaluation Patient Details  Name: [name] MRN: [id number] Date of Birth: 1927/12/17  Today's Date: 05/07/2018 Time: SLP Start Time (ACUTE ONLY): 1330 SLP Stop Time (ACUTE ONLY): 1355 SLP Time Calculation (min) (ACUTE ONLY): 25 min  Past Medical History:  Past Medical History:  Diagnosis Date  . Allergic rhinitis   . Arthritis    "probably in my hands" (02/26/2018)  . Colonic polyp   . Coronary artery disease    a. s/p cath on 02/26/2018 with 3-vessel CAD with 95% proximal LAD, 90% distal RCA, and 70% OM1 stenosis.  She underwent successful PCI with DES x1 to the distal RCA and DES x1 to the proximal LAD  . GERD (gastroesophageal reflux disease)   . History of blood transfusion    "don't remember why or when" (02/26/2018)  . Hyperlipidemia   . Hypertension   . Nonalcoholic hepatosteatosis   . TIA (transient ischemic attack) 2000s   "I've had ~ 3" (02/26/2018)  . Type II diabetes mellitus ([place])    Past Surgical History:  Past Surgical History:  Procedure Laterality Date  . ABDOMINAL HYSTERECTOMY    . APPENDECTOMY  1950s  . CARDIAC CATHETERIZATION  2001   Nonobstructive CAD  . CATARACT EXTRACTION W/ INTRAOCULAR LENS  IMPLANT, BILATERAL Bilateral 2000s  . CORONARY STENT INTERVENTION N/A 02/26/2018   Procedure: CORONARY STENT INTERVENTION;  Surgeon: Martinique, Peter M, MD;  Location: [facility];  Service: Cardiovascular;  Laterality: N/A;  rca   . LEFT HEART CATH AND CORONARY ANGIOGRAPHY N/A 02/26/2018   Procedure: LEFT HEART CATH AND CORONARY ANGIOGRAPHY;  Surgeon: Martinique, Peter M, MD;  Location: [facility];  Service: Cardiovascular;  Laterality: N/A;  . TONSILLECTOMY  1943   HPI:  [name] is a 84 y.o. female with medical history significant of allergic rhinitis, arthritis, colon polyp, coronary artery disease, GERD, history of blood transfusion, hyperlipidemia, hypertension, nonalcoholic hepatosteatosis, TIA, type 2 diabetes  mellitus.,  Per patient she has been having symptoms since Saturday.  However, her husband reported her symptoms started yesterday.  In any case, the symptoms that are reported are left facial droop and left-sided extremities weakness.  She stated that she has had a headache for several days. MRI positive for acute CVA.   Assessment / Plan / Recommendation Clinical Impression  Pt presents with seemingly adequate oropharyngeal swallow despite new left facial asymmetry. Pt with mild left buccal pocketing, which she compensates for with min verbal cue with lingual sweep to clear. Pt encouraged to consume food and liquids slowly, alternate solids and liquids as needed, and to sit fully upright for all eating and drinking. The Pt's spouse reports that Pt has had a stroke in the past and does occasionally cough when drinking liquids, this was not exhibited today. SLP will follow for diet tolerance etc in acute setting and recommend SLP f/u at next level of care for mild dysphagia, dysarthria, and working memory deficits.   SLP Visit Diagnosis: Dysphagia, unspecified (R13.10)    Aspiration Risk  Mild aspiration risk    Diet Recommendation Dysphagia 3 (Mech soft);Thin liquid   Liquid Administration via: Cup Medication Administration: Whole meds with liquid Supervision: Patient able to self feed;Intermittent supervision to cue for compensatory strategies Compensations: Slow rate;Small sips/bites;Lingual sweep for clearance of pocketing Postural Changes: Seated upright at 90 degrees;Remain upright for at least 30 minutes after po intake    Other  Recommendations Oral Care Recommendations: Oral care BID;Staff/trained caregiver to provide oral  care Other Recommendations: Clarify dietary restrictions   Follow up Recommendations Home health SLP      Frequency and Duration min 2x/week  1 week       Prognosis Prognosis for Safe Diet Advancement: Good      Swallow Study   General Date of Onset:  05/07/18 HPI: [name] is a 84 y.o. female with medical history significant of allergic rhinitis, arthritis, colon polyp, coronary artery disease, GERD, history of blood transfusion, hyperlipidemia, hypertension, nonalcoholic hepatosteatosis, TIA, type 2 diabetes mellitus.,  Per patient she has been having symptoms since Saturday.  However, her husband reported her symptoms started yesterday.  In any case, the symptoms that are reported are left facial droop and left-sided extremities weakness.  She stated that she has had a headache for several days. MRI positive for acute CVA. Type of Study: Bedside Swallow Evaluation Diet Prior to this Study: Dysphagia 3 (soft);Thin liquids Temperature Spikes Noted: No Respiratory Status: Room air History of Recent Intubation: No Behavior/Cognition: Alert;Cooperative;Pleasant mood Oral Cavity Assessment: Within Functional Limits;Dry Oral Care Completed by SLP: No Oral Cavity - Dentition: Adequate natural dentition;Missing dentition Vision: Functional for self-feeding Self-Feeding Abilities: Able to feed self Patient Positioning: Upright in bed Baseline Vocal Quality: Normal Volitional Cough: Weak Volitional Swallow: Able to elicit    Oral/Motor/Sensory Function Overall Oral Motor/Sensory Function: Mild impairment Facial ROM: Reduced left;Suspected CN VII (facial) dysfunction Facial Symmetry: Abnormal symmetry left Facial Strength: Reduced left;Suspected CN VII (facial) dysfunction Facial Sensation: Within Functional Limits Lingual ROM: Within Functional Limits Lingual Symmetry: Within Functional Limits Lingual Strength: Within Functional Limits Lingual Sensation: Within Functional Limits Velum: Within Functional Limits Mandible: Within Functional Limits   Ice Chips Ice chips: Within functional limits Presentation: Spoon   Thin Liquid Thin Liquid: Within functional limits Presentation: Cup;Self Fed;Straw    Nectar Thick Nectar Thick Liquid:  Not tested   Honey Thick Honey Thick Liquid: Not tested   Puree Puree: Within functional limits Presentation: Spoon   Solid     Solid: Within functional limits Presentation: Self Fed     Thank you,  Genene Churn, [place]  Desi Rowe 05/07/2018,6:46 PM

## 2018-05-07 NOTE — Progress Notes (Signed)
05/07/2018  1:40 AM  05/07/2018 11:20 AM  Baxter Flattery was seen and examined.  The H&P by the admitting provider, orders, imaging was reviewed.  Please see new orders.  MRI / MRA confirms acute CVA.  Will give aspirin.  Neurology consult requested.  Will continue to follow.   Vitals:   05/07/18 0900 05/07/18 1000  BP:    Pulse: 77 83  Resp: 16 16  Temp:    SpO2: 95% 95%   Results for orders placed or performed during the hospital encounter of 05/07/18  MRSA PCR Screening  Result Value Ref Range   MRSA by PCR NEGATIVE NEGATIVE  Ethanol  Result Value Ref Range   Alcohol, Ethyl (B) <10 <10 mg/dL  Protime-INR  Result Value Ref Range   Prothrombin Time 23.8 (H) 11.4 - 15.2 seconds   INR 2.16   APTT  Result Value Ref Range   aPTT 38 (H) 24 - 36 seconds  CBC  Result Value Ref Range   WBC 8.7 4.0 - 10.5 K/uL   RBC 4.68 3.87 - 5.11 MIL/uL   Hemoglobin 14.1 12.0 - 15.0 g/dL   HCT 43.7 36.0 - 46.0 %   MCV 93.4 80.0 - 100.0 fL   MCH 30.1 26.0 - 34.0 pg   MCHC 32.3 30.0 - 36.0 g/dL   RDW 11.9 11.5 - 15.5 %   Platelets 213 150 - 400 K/uL   nRBC 0.0 0.0 - 0.2 %  Differential  Result Value Ref Range   Neutrophils Relative % 70 %   Neutro Abs 6.2 1.7 - 7.7 K/uL   Lymphocytes Relative 20 %   Lymphs Abs 1.7 0.7 - 4.0 K/uL   Monocytes Relative 8 %   Monocytes Absolute 0.7 0.1 - 1.0 K/uL   Eosinophils Relative 1 %   Eosinophils Absolute 0.1 0.0 - 0.5 K/uL   Basophils Relative 1 %   Basophils Absolute 0.1 0.0 - 0.1 K/uL   Immature Granulocytes 0 %   Abs Immature Granulocytes 0.02 0.00 - 0.07 K/uL  Comprehensive metabolic panel  Result Value Ref Range   Sodium 141 135 - 145 mmol/L   Potassium 4.0 3.5 - 5.1 mmol/L   Chloride 105 98 - 111 mmol/L   CO2 27 22 - 32 mmol/L   Glucose, Bld 156 (H) 70 - 99 mg/dL   BUN 11 8 - 23 mg/dL   Creatinine, Ser 0.58 0.44 - 1.00 mg/dL   Calcium 9.8 8.9 - 10.3 mg/dL   Total Protein 8.1 6.5 - 8.1 g/dL   Albumin 4.4 3.5 - 5.0 g/dL   AST 18 15 -  41 U/L   ALT 18 0 - 44 U/L   Alkaline Phosphatase 113 38 - 126 U/L   Total Bilirubin 0.4 0.3 - 1.2 mg/dL   GFR calc non Af Amer >60 >60 mL/min   GFR calc Af Amer >60 >60 mL/min   Anion gap 9 5 - 15  Urine rapid drug screen (hosp performed)  Result Value Ref Range   Opiates NONE DETECTED NONE DETECTED   Cocaine NONE DETECTED NONE DETECTED   Benzodiazepines NONE DETECTED NONE DETECTED   Amphetamines NONE DETECTED NONE DETECTED   Tetrahydrocannabinol NONE DETECTED NONE DETECTED   Barbiturates NONE DETECTED NONE DETECTED  Urinalysis, Routine w reflex microscopic  Result Value Ref Range   Color, Urine STRAW (A) YELLOW   APPearance CLEAR CLEAR   Specific Gravity, Urine 1.005 1.005 - 1.030   pH 7.0 5.0 - 8.0  Glucose, UA NEGATIVE NEGATIVE mg/dL   Hgb urine dipstick NEGATIVE NEGATIVE   Bilirubin Urine NEGATIVE NEGATIVE   Ketones, ur NEGATIVE NEGATIVE mg/dL   Protein, ur NEGATIVE NEGATIVE mg/dL   Nitrite NEGATIVE NEGATIVE   Leukocytes, UA NEGATIVE NEGATIVE  Phosphorus  Result Value Ref Range   Phosphorus 3.1 2.5 - 4.6 mg/dL  Magnesium  Result Value Ref Range   Magnesium 1.6 (L) 1.7 - 2.4 mg/dL  Glucose, capillary  Result Value Ref Range   Glucose-Capillary 113 (H) 70 - 99 mg/dL  Glucose, capillary  Result Value Ref Range   Glucose-Capillary 112 (H) 70 - 99 mg/dL  I-Stat Chem 8, ED  Result Value Ref Range   Sodium 142 135 - 145 mmol/L   Potassium 3.8 3.5 - 5.1 mmol/L   Chloride 104 98 - 111 mmol/L   BUN 11 8 - 23 mg/dL   Creatinine, Ser 0.50 0.44 - 1.00 mg/dL   Glucose, Bld 152 (H) 70 - 99 mg/dL   Calcium, Ion 1.24 1.15 - 1.40 mmol/L   TCO2 33 (H) 22 - 32 mmol/L   Hemoglobin 14.6 12.0 - 15.0 g/dL   HCT 43.0 36.0 - 46.0 %  I-stat troponin, ED  Result Value Ref Range   Troponin i, poc 0.00 0.00 - 0.08 ng/mL   Comment 3           C. Wynetta Emery, MD [facility]

## 2018-05-07 NOTE — Progress Notes (Signed)
ANTICOAGULATION CONSULT NOTE - Initial Consult  Pharmacy Consult for Coumadin Indication: atrial fibrillation  Allergies  Allergen Reactions  . Ivp Dye [Iodinated Diagnostic Agents] Anaphylaxis  . Sulfonamide Derivatives Shortness Of Breath  . Lipitor [Atorvastatin Calcium]     Muscle Aches  . Zetia [Ezetimibe]     Muscle Aches  . Penicillins Rash    As a teenager. Has patient had a PCN reaction causing immediate rash, facial/tongue/throat swelling, SOB or lightheadedness with hypotension: No Has patient had a PCN reaction causing severe rash involving mucus membranes or skin necrosis: No Has patient had a PCN reaction that required hospitalization: No Has patient had a PCN reaction occurring within the last 10 years: No If all of the above answers are "NO", then may proceed with Cephalosporin use.     Patient Measurements: Height: 5' 4"  (162.6 cm) Weight: 161 lb 2.5 oz (73.1 kg) IBW/kg (Calculated) : 54.7  Vital Signs: Temp: 97.3 F (36.3 C) (12/03 1124) Temp Source: Oral (12/03 1124) BP: 161/66 (12/03 0700) Pulse Rate: 81 (12/03 1124)  Labs: Recent Labs    05/07/18 0258 05/07/18 0310  HGB 14.1 14.6  HCT 43.7 43.0  PLT 213  --   APTT 38*  --   LABPROT 23.8*  --   INR 2.16  --   CREATININE 0.58 0.50    Estimated Creatinine Clearance: 47.7 mL/min (by C-G formula based on SCr of 0.5 mg/dL).   Medical History: Past Medical History:  Diagnosis Date  . Allergic rhinitis   . Arthritis    "probably in my hands" (02/26/2018)  . Colonic polyp   . Coronary artery disease    a. s/p cath on 02/26/2018 with 3-vessel CAD with 95% proximal LAD, 90% distal RCA, and 70% OM1 stenosis.  She underwent successful PCI with DES x1 to the distal RCA and DES x1 to the proximal LAD  . GERD (gastroesophageal reflux disease)   . History of blood transfusion    "don't remember why or when" (02/26/2018)  . Hyperlipidemia   . Hypertension   . Nonalcoholic hepatosteatosis   . TIA  (transient ischemic attack) 2000s   "I've had ~ 3" (02/26/2018)  . Type II diabetes mellitus ([facility])     Medications:  Medications Prior to Admission  Medication Sig Dispense Refill Last Dose  . amLODipine (NORVASC) 10 MG tablet Take 1 tablet (10 mg total) by mouth daily. 90 tablet 3 Taking  . carboxymethylcellulose (REFRESH) 1 % ophthalmic solution Place 1 drop into both eyes 2 (two) times daily as needed (dry eyes).    Taking  . clopidogrel (PLAVIX) 75 MG tablet Take 1 tablet (75 mg total) by mouth daily with breakfast. 90 tablet 3   . docusate sodium (COLACE) 100 MG capsule Take 200 mg by mouth 2 (two) times daily as needed for mild constipation.    Taking  . fexofenadine (ALLEGRA) 180 MG tablet Take 180 mg by mouth daily.     Taking  . fluticasone (FLONASE) 50 MCG/ACT nasal spray Place 2 sprays into both nostrils daily.    Taking  . glipiZIDE (GLUCOTROL XL) 2.5 MG 24 hr tablet Take 2.5 mg by mouth daily with breakfast.    Taking  . losartan (COZAAR) 100 MG tablet Take 100 mg by mouth daily.   Taking  . metFORMIN (GLUCOPHAGE-XR) 500 MG 24 hr tablet Take 500 mg by mouth daily with breakfast.     Taking  . metoprolol succinate (TOPROL-XL) 25 MG 24 hr tablet Take 1 tablet (  25 mg total) by mouth daily. 90 tablet 3   . Multiple Vitamin (MULTIVITAMIN) tablet Take 1 tablet by mouth daily.     Taking  . nitroGLYCERIN (NITROSTAT) 0.4 MG SL tablet Place 0.4 mg under the tongue every 5 (five) minutes as needed for chest pain.   Taking  . pantoprazole (PROTONIX) 40 MG tablet Take 1 tablet (40 mg total) by mouth daily. 90 tablet 3   . rosuvastatin (CRESTOR) 5 MG tablet Take 1 tablet (5 mg total) by mouth once a week. 15 tablet 3   . ursodiol (ACTIGALL) 300 MG capsule Take 300 mg by mouth 2 (two) times daily.    Taking  . vitamin E (VITAMIN E) 400 UNIT capsule Take 400 Units by mouth 2 (two) times daily.    Taking  . warfarin (JANTOVEN) 5 MG tablet Take coumadin 1 1/2 tablets daily except 1 tablet on  Sundays and Wednesdays 135 tablet 3     Assessment: 84 y.o. female with medical history significant of allergic rhinitis, arthritis, colon polyp, coronary artery disease, GERD, history of blood transfusion, hyperlipidemia, hypertension, nonalcoholic hepatosteatosis, TIA, type 2 diabetes mellitus. Patient admitted with acute left-sided weakness. Pharmacy asked to dose coumadin which she takes chronically for TIA, afib, and PFO PT-INR therapeutic today. Home dose is 7.56m daily except 543mon Sunday and Wednesday. Followed in anticoag clinic  Goal of Therapy:  INR 2-3 Monitor platelets by anticoagulation protocol: Yes   Plan:  Coumadin 7.24m64mo x 1 today PT-INR daily Monitor for s/s of bleeding  LorIsac SarnaS PhaVena AustriaCPS Clinical Pharmacist Pager #[phone]/08/2017,12:13 PM

## 2018-05-07 NOTE — Evaluation (Signed)
Speech Language Pathology Evaluation Patient Details Name: [name] MRN: [id number] DOB: 1928-05-06 Today's Date: 05/07/2018 Time: 4650-3546 SLP Time Calculation (min) (ACUTE ONLY): 19 min  Problem List:  Patient Active Problem List   Diagnosis Date Noted  . Acute left-sided weakness 05/07/2018  . Nonalcoholic hepatosteatosis 56/81/2751  . Acute CVA (cerebrovascular accident) ([place]) 05/07/2018  . Weakness 05/07/2018  . Unstable angina ([place]) 02/26/2018  . TIA (transient ischemic attack) 02/07/2011  . PFO (patent foramen ovale) 02/07/2011  . Long term (current) use of anticoagulants 02/07/2011  . Type II diabetes mellitus ([place]) 09/30/2008  . CHANGE IN BOWELS 09/30/2008  . HYPERCHOLESTEROLEMIA 09/29/2008  . Essential hypertension 09/29/2008  . Coronary atherosclerosis 09/29/2008  . VENTRICULAR HYPERTROPHY, LEFT 09/29/2008  . ALLERGIC RHINITIS, CHRONIC 09/29/2008  . OTHER CHRONIC NONALCOHOLIC LIVER DISEASE 70/06/7492  . LIVER FUNCTION TESTS, ABNORMAL, HX OF 09/29/2008  . COLONIC POLYPS, HX OF 09/29/2008  . GERD (gastroesophageal reflux disease) 09/29/2008   Past Medical History:  Past Medical History:  Diagnosis Date  . Allergic rhinitis   . Arthritis    "probably in my hands" (02/26/2018)  . Colonic polyp   . Coronary artery disease    a. s/p cath on 02/26/2018 with 3-vessel CAD with 95% proximal LAD, 90% distal RCA, and 70% OM1 stenosis.  She underwent successful PCI with DES x1 to the distal RCA and DES x1 to the proximal LAD  . GERD (gastroesophageal reflux disease)   . History of blood transfusion    "don't remember why or when" (02/26/2018)  . Hyperlipidemia   . Hypertension   . Nonalcoholic hepatosteatosis   . TIA (transient ischemic attack) 2000s   "I've had ~ 3" (02/26/2018)  . Type II diabetes mellitus ([place])    Past Surgical History:  Past Surgical History:  Procedure Laterality Date  . ABDOMINAL HYSTERECTOMY    . APPENDECTOMY  1950s  . CARDIAC  CATHETERIZATION  2001   Nonobstructive CAD  . CATARACT EXTRACTION W/ INTRAOCULAR LENS  IMPLANT, BILATERAL Bilateral 2000s  . CORONARY STENT INTERVENTION N/A 02/26/2018   Procedure: CORONARY STENT INTERVENTION;  Surgeon: Martinique, Peter M, MD;  Location: [facility];  Service: Cardiovascular;  Laterality: N/A;  rca   . LEFT HEART CATH AND CORONARY ANGIOGRAPHY N/A 02/26/2018   Procedure: LEFT HEART CATH AND CORONARY ANGIOGRAPHY;  Surgeon: Martinique, Peter M, MD;  Location: [facility];  Service: Cardiovascular;  Laterality: N/A;  . TONSILLECTOMY  1943   HPI:  [name] is a 84 y.o. female with medical history significant of allergic rhinitis, arthritis, colon polyp, coronary artery disease, GERD, history of blood transfusion, hyperlipidemia, hypertension, nonalcoholic hepatosteatosis, TIA, type 2 diabetes mellitus.,  Per patient she has been having symptoms since Saturday.  However, her husband reported her symptoms started yesterday.  In any case, the symptoms that are reported are left facial droop and left-sided extremities weakness.  She stated that she has had a headache for several days. MRI positive for acute CVA.   Assessment / Plan / Recommendation Clinical Impression  Pt with mild dysarthria and mild working memory deficits to which some is attributed to a previous stroke, but slightly exacerbated at this time. Pt notes improved movement on the left side throughout the day. Recommend f/u SLP at next level of care.    SLP Assessment  SLP Recommendation/Assessment: All further Speech Lanaguage Pathology  needs can be addressed in the next venue of care SLP Visit Diagnosis: Dysarthria and anarthria (R47.1);Cognitive communication deficit (  R41.841)    Follow Up Recommendations  Home health SLP    Frequency and Duration min 2x/week         SLP Evaluation Cognition  Overall Cognitive Status: Within Functional Limits for tasks assessed Arousal/Alertness:  Awake/alert Orientation Level: Oriented X4 Memory: Impaired Memory Impairment: Decreased recall of new information Awareness: Appears intact Problem Solving: Appears intact Safety/Judgment: Appears intact       Comprehension  Auditory Comprehension Overall Auditory Comprehension: Appears within functional limits for tasks assessed Yes/No Questions: Within Functional Limits Commands: Within Functional Limits Conversation: Complex Interfering Components: Working Field seismologist: Repetition Retail banker: Within Function Limits Reading Comprehension Reading Status: Not tested    Expression Expression Primary Mode of Expression: Verbal Verbal Expression Overall Verbal Expression: Appears within functional limits for tasks assessed Initiation: No impairment Level of Generative/Spontaneous Verbalization: Conversation Repetition: No impairment Naming: No impairment Pragmatics: No impairment Interfering Components: Speech intelligibility Non-Verbal Means of Communication: Not applicable Written Expression Dominant Hand: Right Written Expression: Not tested   Oral / Motor  Oral Motor/Sensory Function Overall Oral Motor/Sensory Function: Mild impairment Facial ROM: Reduced left;Suspected CN VII (facial) dysfunction Facial Symmetry: Abnormal symmetry left Facial Strength: Reduced left;Suspected CN VII (facial) dysfunction Facial Sensation: Within Functional Limits Lingual ROM: Within Functional Limits Lingual Symmetry: Within Functional Limits Lingual Strength: Within Functional Limits Lingual Sensation: Within Functional Limits Velum: Within Functional Limits Mandible: Within Functional Limits Motor Speech Overall Motor Speech: Impaired Respiration: Within functional limits Phonation: Normal Resonance: Within functional limits Articulation: Within functional limitis Intelligibility: Intelligibility reduced Phrase: 75-100%  accurate Conversation: 75-100% accurate Motor Planning: Witnin functional limits Motor Speech Errors: Aware Effective Techniques: Over-articulate   Thank you,  Genene Churn, [place]                     [place] 05/07/2018, 6:55 PM

## 2018-05-07 NOTE — ED Provider Notes (Signed)
[facility] EMERGENCY DEPARTMENT Provider Note   CSN: [id number] Arrival date & time: 05/07/18  0140     History   Chief Complaint Chief Complaint  Patient presents with  . Altered Mental Status    HPI [name] is a 84 y.o. female.  HPI  This is an 84 year old female with history of coronary artery disease status post stent placement in September 2019, hypertension, hyperlipidemia, TIA, diabetes who presents with left-sided weakness and facial droop.  Husband patient reports onset of symptoms at 10 AM yesterday.  Husband reports that he got his blood drawn and walked to the car and noted facial droop.  Patient declined being evaluated at that time.  She reports that she had progressively work ascending weakness of her left side specifically her left leg.  It made it difficult for her to ambulate.  She is on Coumadin.  She reports over the last several days she has had a mild headache.  Has not noted any speech difficulty or vision deficits.  Denies any recent illnesses or fevers.  Past Medical History:  Diagnosis Date  . Allergic rhinitis   . Arthritis    "probably in my hands" (02/26/2018)  . Colonic polyp   . Coronary artery disease    a. s/p cath on 02/26/2018 with 3-vessel CAD with 95% proximal LAD, 90% distal RCA, and 70% OM1 stenosis.  She underwent successful PCI with DES x1 to the distal RCA and DES x1 to the proximal LAD  . GERD (gastroesophageal reflux disease)   . History of blood transfusion    "don't remember why or when" (02/26/2018)  . Hyperlipidemia   . Hypertension   . Nonalcoholic hepatosteatosis   . TIA (transient ischemic attack) 2000s   "I've had ~ 3" (02/26/2018)  . Type II diabetes mellitus [facility])     Patient Active Problem List   Diagnosis Date Noted  . Unstable angina ([place]) 02/26/2018  . TIA (transient ischemic attack) 02/07/2011  . PFO (patent foramen ovale) 02/07/2011  . Long term (current) use of anticoagulants 02/07/2011  . DM 09/30/2008    . CHANGE IN BOWELS 09/30/2008  . HYPERCHOLESTEROLEMIA 09/29/2008  . Essential hypertension 09/29/2008  . CORONARY ARTERY DISEASE 09/29/2008  . VENTRICULAR HYPERTROPHY, LEFT 09/29/2008  . ALLERGIC RHINITIS, CHRONIC 09/29/2008  . OTHER CHRONIC NONALCOHOLIC LIVER DISEASE 98/92/1194  . LIVER FUNCTION TESTS, ABNORMAL, HX OF 09/29/2008  . COLONIC POLYPS, HX OF 09/29/2008  . GASTROESOPHAGEAL REFLUX DISEASE, HX OF 09/29/2008    Past Surgical History:  Procedure Laterality Date  . ABDOMINAL HYSTERECTOMY    . APPENDECTOMY  1950s  . CARDIAC CATHETERIZATION  2001   Nonobstructive CAD  . CATARACT EXTRACTION W/ INTRAOCULAR LENS  IMPLANT, BILATERAL Bilateral 2000s  . CORONARY STENT INTERVENTION N/A 02/26/2018   Procedure: CORONARY STENT INTERVENTION;  Surgeon: Martinique, Peter M, MD;  Location: [place];  Service: Cardiovascular;  Laterality: N/A;  rca   . LEFT HEART CATH AND CORONARY ANGIOGRAPHY N/A 02/26/2018   Procedure: LEFT HEART CATH AND CORONARY ANGIOGRAPHY;  Surgeon: Martinique, Peter M, MD;  Location: [place] [facility];  Service: Cardiovascular;  Laterality: N/A;  . TONSILLECTOMY  1943     OB History   None      Home Medications    Prior to Admission medications   Medication Sig Start Date End Date Taking? Authorizing Provider  amLODipine (NORVASC) 10 MG tablet Take 1 tablet (10 mg total) by mouth daily. 01/29/18 04/29/18  Erma Heritage, PA-C  carboxymethylcellulose (REFRESH) 1 % ophthalmic solution Place 1 drop into both eyes 2 (two) times daily as needed (dry eyes).     [provider]  clopidogrel (PLAVIX) 75 MG tablet Take 1 tablet (75 mg total) by mouth daily with breakfast. 04/02/18   Strader, Fransisco Hertz, PA-C  docusate sodium (COLACE) 100 MG capsule Take 200 mg by mouth 2 (two) times daily as needed for mild constipation.     [provider]  fexofenadine (ALLEGRA) 180 MG tablet Take 180 mg by mouth daily.      [provider]   fluticasone (FLONASE) 50 MCG/ACT nasal spray Place 2 sprays into both nostrils daily.     [provider]  glipiZIDE (GLUCOTROL XL) 2.5 MG 24 hr tablet Take 2.5 mg by mouth daily with breakfast.     [provider]  losartan (COZAAR) 100 MG tablet Take 100 mg by mouth daily.    [provider]  metFORMIN (GLUCOPHAGE-XR) 500 MG 24 hr tablet Take 500 mg by mouth daily with breakfast.      [provider]  metoprolol succinate (TOPROL-XL) 25 MG 24 hr tablet Take 1 tablet (25 mg total) by mouth daily. 04/02/18   Strader, Fransisco Hertz, PA-C  Multiple Vitamin (MULTIVITAMIN) tablet Take 1 tablet by mouth daily.      [provider]  nitroGLYCERIN (NITROSTAT) 0.4 MG SL tablet Place 0.4 mg under the tongue every 5 (five) minutes as needed for chest pain.    de Stanford Scotland, MD  pantoprazole (PROTONIX) 40 MG tablet Take 1 tablet (40 mg total) by mouth daily. 04/02/18   Strader, Fransisco Hertz, PA-C  rosuvastatin (CRESTOR) 5 MG tablet Take 1 tablet (5 mg total) by mouth once a week. 04/02/18 07/01/18  Strader, Fransisco Hertz, PA-C  ursodiol (ACTIGALL) 300 MG capsule Take 300 mg by mouth 2 (two) times daily.     [provider]  vitamin E (VITAMIN E) 400 UNIT capsule Take 400 Units by mouth 2 (two) times daily.     [provider]  warfarin (JANTOVEN) 5 MG tablet Take coumadin 1 1/2 tablets daily except 1 tablet on Sundays and Wednesdays 04/02/18   Erma Heritage, PA-C    Family History Family History  Problem Relation Age of Onset  . Heart failure Mother   . Prostate cancer Father   . Prostate cancer Brother     Social History Social History   Tobacco Use  . Smoking status: Former Smoker    Packs/day: 2.00    Years: 40.00    Pack years: 80.00    Types: Cigarettes    Last attempt to quit: 06/05/1992    Years since quitting: 25.9  . Smokeless tobacco: Former Systems developer    Types: Chew    Quit date: 12/03/1992  . Tobacco comment: used tobacco to  help quit smoking  Substance Use Topics  . Alcohol use: Never    Frequency: Never  . Drug use: Never     Allergies   Ivp dye [iodinated diagnostic agents]; Sulfonamide derivatives; Lipitor [atorvastatin calcium]; Zetia [ezetimibe]; and Penicillins   Review of Systems Review of Systems  Constitutional: Negative for fever.  Eyes: Negative for visual disturbance.  Respiratory: Negative for shortness of breath.   Cardiovascular: Negative for chest pain.  Gastrointestinal: Negative for abdominal pain, nausea and vomiting.  Neurological: Positive for facial asymmetry, weakness and headaches. Negative for speech difficulty and numbness.  All other systems reviewed and are negative.    Physical  Exam Updated Vital Signs BP (!) 151/70   Pulse 78   Temp 98.7 F (37.1 C) (Oral)   Resp 18   Ht 1.626 m (5' 4" )   Wt 72.6 kg   SpO2 93%   BMI 27.46 kg/m   Physical Exam  Constitutional: She is oriented to person, place, and time. She appears well-developed and well-nourished.  HENT:  Head: Normocephalic and atraumatic.  Eyes: Pupils are equal, round, and reactive to light.  Pupils 3 mm reactive bilaterally  Neck: Neck supple.  Cardiovascular: Normal rate, regular rhythm and normal heart sounds.  Pulmonary/Chest: Effort normal and breath sounds normal. No respiratory distress. She has no wheezes.  Abdominal: Soft. Bowel sounds are normal. There is no tenderness.  Musculoskeletal: She exhibits no edema.  Neurological: She is alert and oriented to person, place, and time.  Left-sided facial droop noted, patient with drift left upper extremity, otherwise she has 4+ out of 5 grip strength, biceps, triceps strength on the left, left lower extremity with significant drift and 3+ out of 5 strength with hip flexion, no neglect noted, no visual field defects  Skin: Skin is warm and dry.  Psychiatric: She has a normal mood and affect.  Nursing note and vitals reviewed.    ED Treatments /  Results  Labs (all labs ordered are listed, but only abnormal results are displayed) Labs Reviewed  PROTIME-INR - Abnormal; Notable for the following components:      Result Value   Prothrombin Time 23.8 (*)    All other components within normal limits  APTT - Abnormal; Notable for the following components:   aPTT 38 (*)    All other components within normal limits  COMPREHENSIVE METABOLIC PANEL - Abnormal; Notable for the following components:   Glucose, Bld 156 (*)    All other components within normal limits  URINALYSIS, ROUTINE W REFLEX MICROSCOPIC - Abnormal; Notable for the following components:   Color, Urine STRAW (*)    All other components within normal limits  I-STAT CHEM 8, ED - Abnormal; Notable for the following components:   Glucose, Bld 152 (*)    TCO2 33 (*)    All other components within normal limits  ETHANOL  CBC  DIFFERENTIAL  RAPID URINE DRUG SCREEN, HOSP PERFORMED  I-STAT TROPONIN, ED    EKG EKG Interpretation  Date/Time:  Tuesday May 07 2018 01:42:19 EST Ventricular Rate:  77 PR Interval:    QRS Duration: 90 QT Interval:  395 QTC Calculation: 447 R Axis:   -41 Text Interpretation:  Sinus rhythm Ventricular premature complex Abnormal R-wave progression, early transition Inferior infarct, old Confirmed by Thayer Jew [phone]) on 05/07/2018 2:55:52 AM   Radiology Ct Head Wo Contrast  Result Date: 05/07/2018 CLINICAL DATA:  84 year old female with abnormal gait and left facial droop. EXAM: CT HEAD WITHOUT CONTRAST TECHNIQUE: Contiguous axial images were obtained from the base of the skull through the vertex without intravenous contrast. COMPARISON:  Head CT dated 04/02/2013 FINDINGS: Brain: There is mild age-related atrophy. Moderate chronic microvascular ischemic changes noted. There is no acute intracranial hemorrhage. No mass effect or midline shift. No extra-axial fluid collection. Vascular: No hyperdense vessel or unexpected calcification.  Skull: Normal. Negative for fracture or focal lesion. Sinuses/Orbits: Small bilateral maxillary sinus retention cysts or polyps. No air-fluid levels. The mastoid air cells are clear. Other: None IMPRESSION: 1. No acute intracranial hemorrhage. 2. Age-related atrophy and chronic microvascular ischemic changes. Electronically Signed   By: Laren Everts.D.  On: 05/07/2018 02:36    Procedures Procedures (including critical care time)  Medications Ordered in ED Medications - No data to display   Initial Impression / Assessment and Plan / ED Course  I have reviewed the triage vital signs and the nursing notes.  Pertinent labs & imaging results that were available during my care of the patient were reviewed by me and considered in my medical decision making (see chart for details).     Patient presents with left-sided facial droop and weakness.  She is overall nontoxic-appearing vital signs are initially notable for hypertension.  ABCs are intact.  She has left facial droop as well as some left-sided upper and lower extremity weakness and drift.  Symptoms ongoing for greater than 12 hours.  She is not a TPA candidate.  She is VAN negative.  She is also on Coumadin.  Given headache, bleed would be consideration.  Stroke work-up was initiated.  CT scan does not show any obvious bleed.  Her laboratory work-up is largely reassuring including a therapeutic INR at 2.16.  Given deficits, feel she needs stroke work-up.  Patient was discussed with the hospitalist.  We will plan for admission for stroke work-up and MRI.  Final Clinical Impressions(s) / ED Diagnoses   Final diagnoses:  Left-sided weakness    ED Discharge Orders    None       Merryl Hacker, MD 05/07/18 [phone]

## 2018-05-07 NOTE — Progress Notes (Signed)
05/07/2018 4:11 PM  I spoke with [place] office and they are arranging for event monitor and will place on patient tomorrow.   Murvin Natal MD

## 2018-05-07 NOTE — Progress Notes (Signed)
*  PRELIMINARY RESULTS* Echocardiogram 2D Echocardiogram has been performed.  [name] 05/07/2018, 12:32 PM

## 2018-05-07 NOTE — Plan of Care (Signed)
  Problem: Acute Rehab OT Goals (only OT should resolve) Goal: Pt. Will Perform Eating Flowsheets (Taken 05/07/2018 1243) Pt Will Perform Eating: with set-up; sitting Goal: Pt. Will Perform Grooming Flowsheets (Taken 05/07/2018 1243) Pt Will Perform Grooming: with set-up; sitting; standing Goal: Pt. Will Perform Upper Body Dressing Flowsheets (Taken 05/07/2018 1243) Pt Will Perform Upper Body Dressing: with min assist; sitting; standing Goal: Pt. Will Perform Lower Body Dressing Flowsheets (Taken 05/07/2018 1243) Pt Will Perform Lower Body Dressing: with min assist; sit to/from stand; sitting/lateral leans Goal: Pt. Will Transfer To Toilet Flowsheets (Taken 05/07/2018 1243) Pt Will Transfer to Toilet: with min assist; stand pivot transfer; ambulating; regular height toilet; bedside commode Goal: Pt. Will Perform Toileting-Clothing Manipulation Flowsheets (Taken 05/07/2018 1243) Pt Will Perform Toileting - Clothing Manipulation and hygiene: with supervision; sitting/lateral leans; sit to/from stand Goal: Pt/Caregiver Will Perform Home Exercise Program Flowsheets (Taken 05/07/2018 1243) Pt/caregiver will Perform Home Exercise Program: Increased strength; Left upper extremity; Independently; With written HEP provided

## 2018-05-07 NOTE — H&P (Signed)
History and Physical    [name] JME:[id number] DOB: [birth year] DOA: 05/07/2018  PCP: Loman Brooklyn, FNP   Patient coming from: Home  Chief Complaint: Altered Mental Status  HPI: [name] is a 84 y.o. female with medical history significant of allergic rhinitis, arthritis, colon polyp, coronary artery disease, GERD, history of blood transfusion, hyperlipidemia, hypertension, nonalcoholic hepatosteatosis, TIA, type 2 diabetes mellitus.,  Per patient she has been having symptoms since Saturday.  However, her husband reported her symptoms started yesterday.  In any case, the symptoms that are reported are left facial droop and left-sided extremities weakness.  She stated that she has had a headache for several days.  Denies vision changes or slurred speech.  No appetite or sleep changes, fever, chills, sore throat, dyspnea, chest pain, palpitations, dizziness, diaphoresis, dyspnea, PND, orthopnea or recent pitting edema of the lower extremities.  No abdominal pain, nausea, emesis, diarrhea, constipation, melena or hematochezia.  Denies dysuria, frequency or hematuria.  She denies anxious or depressive mood.  ED Course: BP (!) 151/70   Pulse 78   Temp 98.7 F (37.1 C) (Oral)   Resp 18   Ht 1.626 m (5' 4" )   Wt 72.6 kg   SpO2 93%   BMI 27.46 kg/m  Her CBC was normal.  Urinalysis was unremarkable.  UDS was normal.  I-STAT troponin was 0.00 ng/mL.  Alcohol level was normal.  PT was 23.8 and PTT 38 seconds with an INR of 2.16.  CMP shows a glucose of 156 mg/dL, all other values are within normal limits.  Phosphorus was normal.  Magnesium was low at 1.6 mg/dL.   Imaging: CT head did not show any acute intracranial hemorrhage or any other acute pathology.  There was age-related atrophy and chronic microvascular ischemic changes.  Please images and full radiology report for further detail.  Review of Systems: As per HPI otherwise 10 point review of systems negative.   Past Medical  History:  Diagnosis Date  . Allergic rhinitis   . Arthritis    "probably in my hands" (02/26/2018)  . Colonic polyp   . Coronary artery disease    a. s/p cath on 02/26/2018 with 3-vessel CAD with 95% proximal LAD, 90% distal RCA, and 70% OM1 stenosis.  She underwent successful PCI with DES x1 to the distal RCA and DES x1 to the proximal LAD  . GERD (gastroesophageal reflux disease)   . History of blood transfusion    "don't remember why or when" (02/26/2018)  . Hyperlipidemia   . Hypertension   . Nonalcoholic hepatosteatosis   . TIA (transient ischemic attack) 2000s   "I've had ~ 3" (02/26/2018)  . Type II diabetes mellitus ([place])     Past Surgical History:  Procedure Laterality Date  . ABDOMINAL HYSTERECTOMY    . APPENDECTOMY  1950s  . CARDIAC CATHETERIZATION  2001   Nonobstructive CAD  . CATARACT EXTRACTION W/ INTRAOCULAR LENS  IMPLANT, BILATERAL Bilateral 2000s  . CORONARY STENT INTERVENTION N/A 02/26/2018   Procedure: CORONARY STENT INTERVENTION;  Surgeon: Martinique, Peter M, MD;  Location: [facility];  Service: Cardiovascular;  Laterality: N/A;  rca   . LEFT HEART CATH AND CORONARY ANGIOGRAPHY N/A 02/26/2018   Procedure: LEFT HEART CATH AND CORONARY ANGIOGRAPHY;  Surgeon: Martinique, Peter M, MD;  Location: [facility];  Service: Cardiovascular;  Laterality: N/A;  . TONSILLECTOMY  1943     reports that she quit smoking about 25 years ago. Her smoking use  included cigarettes. She has a 80.00 pack-year smoking history. She quit smokeless tobacco use about 25 years ago.  Her smokeless tobacco use included chew. She reports that she does not drink alcohol or use drugs.  Allergies  Allergen Reactions  . Ivp Dye [Iodinated Diagnostic Agents] Anaphylaxis  . Sulfonamide Derivatives Shortness Of Breath  . Lipitor [Atorvastatin Calcium]     Muscle Aches  . Zetia [Ezetimibe]     Muscle Aches  . Penicillins Rash    As a teenager. Has patient had a PCN reaction causing  immediate rash, facial/tongue/throat swelling, SOB or lightheadedness with hypotension: No Has patient had a PCN reaction causing severe rash involving mucus membranes or skin necrosis: No Has patient had a PCN reaction that required hospitalization: No Has patient had a PCN reaction occurring within the last 10 years: No If all of the above answers are "NO", then may proceed with Cephalosporin use.     Family History  Problem Relation Age of Onset  . Heart failure Mother   . Prostate cancer Father   . Prostate cancer Brother      Prior to Admission medications   Medication Sig Start Date End Date Taking? Authorizing Provider  amLODipine (NORVASC) 10 MG tablet Take 1 tablet (10 mg total) by mouth daily. 01/29/18 04/29/18  Strader, Fransisco Hertz, PA-C  carboxymethylcellulose (REFRESH) 1 % ophthalmic solution Place 1 drop into both eyes 2 (two) times daily as needed (dry eyes).     [provider]  clopidogrel (PLAVIX) 75 MG tablet Take 1 tablet (75 mg total) by mouth daily with breakfast. 04/02/18   Strader, Fransisco Hertz, PA-C  docusate sodium (COLACE) 100 MG capsule Take 200 mg by mouth 2 (two) times daily as needed for mild constipation.     [provider]  fexofenadine (ALLEGRA) 180 MG tablet Take 180 mg by mouth daily.      [provider]  fluticasone (FLONASE) 50 MCG/ACT nasal spray Place 2 sprays into both nostrils daily.     [provider]  glipiZIDE (GLUCOTROL XL) 2.5 MG 24 hr tablet Take 2.5 mg by mouth daily with breakfast.     [provider]  losartan (COZAAR) 100 MG tablet Take 100 mg by mouth daily.    [provider]  metFORMIN (GLUCOPHAGE-XR) 500 MG 24 hr tablet Take 500 mg by mouth daily with breakfast.      [provider]  metoprolol succinate (TOPROL-XL) 25 MG 24 hr tablet Take 1 tablet (25 mg total) by mouth daily. 04/02/18   Strader, Fransisco Hertz, PA-C  Multiple Vitamin (MULTIVITAMIN) tablet Take 1 tablet by  mouth daily.      [provider]  nitroGLYCERIN (NITROSTAT) 0.4 MG SL tablet Place 0.4 mg under the tongue every 5 (five) minutes as needed for chest pain.    de Stanford Scotland, MD  pantoprazole (PROTONIX) 40 MG tablet Take 1 tablet (40 mg total) by mouth daily. 04/02/18   Strader, Fransisco Hertz, PA-C  rosuvastatin (CRESTOR) 5 MG tablet Take 1 tablet (5 mg total) by mouth once a week. 04/02/18 07/01/18  Strader, Fransisco Hertz, PA-C  ursodiol (ACTIGALL) 300 MG capsule Take 300 mg by mouth 2 (two) times daily.     [provider]  vitamin E (VITAMIN E) 400 UNIT capsule Take 400 Units by mouth 2 (two) times daily.     [provider]  warfarin (JANTOVEN) 5 MG tablet Take coumadin 1 1/2 tablets daily except 1 tablet on Sundays  and Wednesdays 04/02/18   Erma Heritage, PA-C    Physical Exam: Vitals:   05/07/18 0400 05/07/18 0430 05/07/18 0445 05/07/18 0455  BP: (!) 153/90 (!) 168/80 (!) 169/87   Pulse: 81 74 86   Resp: 18 16 (!) 23   Temp:      TempSrc:      SpO2: 92% 93% 95%   Weight:    73.1 kg  Height:    5' 4"  (1.626 m)    Constitutional: NAD, calm, comfortable Eyes: PERRL, lids and conjunctivae normal ENMT: Mucous membranes are moist. Posterior pharynx clear of any exudate or lesions. Neck: normal, supple, no masses, no thyromegaly Respiratory: clear to auscultation bilaterally, no wheezing, no crackles. Normal respiratory effort. No accessory muscle use.  Cardiovascular: Regular rate and rhythm, no murmurs / rubs / gallops. No extremity edema. 2+ pedal pulses. No carotid bruits.  Abdomen: Soft, no tenderness, no masses palpated. No hepatosplenomegaly. Bowel sounds positive.  Musculoskeletal: no clubbing / cyanosis. Good ROM, no contractures. Normal muscle tone.  Skin: Few areas of ecchymosis on upper extremities.  There are multiple hyperpigmented lesions on extremities, abdomen, chest and upper torso. Neurologic: Left facial droop, otherwise CN 2-12 grossly  intact.  Decreased sensation on left sided extremities.  3/5 strength on left lower extremity and 4/5 on left upper extremities.  She has some difficulty with coordination on nose to finger testing on the left.  Left-sided pronator drift.  DTRs were mildly brisk on the left. Psychiatric: Normal judgment and insight. Alert and oriented x 4. Normal mood.    Labs on Admission: I have personally reviewed following labs and imaging studies  CBC: Recent Labs  Lab 05/07/18 0258 05/07/18 0310  WBC 8.7  --   NEUTROABS 6.2  --   HGB 14.1 14.6  HCT 43.7 43.0  MCV 93.4  --   PLT 213  --    Basic Metabolic Panel: Recent Labs  Lab 05/07/18 0258 05/07/18 0310  NA 141 142  K 4.0 3.8  CL 105 104  CO2 27  --   GLUCOSE 156* 152*  BUN 11 11  CREATININE 0.58 0.50  CALCIUM 9.8  --    GFR: Estimated Creatinine Clearance: 47.7 mL/min (by C-G formula based on SCr of 0.5 mg/dL). Liver Function Tests: Recent Labs  Lab 05/07/18 0258  AST 18  ALT 18  ALKPHOS 113  BILITOT 0.4  PROT 8.1  ALBUMIN 4.4   No results for input(s): LIPASE, AMYLASE in the last 168 hours. No results for input(s): AMMONIA in the last 168 hours. Coagulation Profile: Recent Labs  Lab 05/07/18 0258  INR 2.16   Cardiac Enzymes: No results for input(s): CKTOTAL, CKMB, CKMBINDEX, TROPONINI in the last 168 hours. BNP (last 3 results) No results for input(s): PROBNP in the last 8760 hours. HbA1C: No results for input(s): HGBA1C in the last 72 hours. CBG: No results for input(s): GLUCAP in the last 168 hours. Lipid Profile: No results for input(s): CHOL, HDL, LDLCALC, TRIG, CHOLHDL, LDLDIRECT in the last 72 hours. Thyroid Function Tests: No results for input(s): TSH, T4TOTAL, FREET4, T3FREE, THYROIDAB in the last 72 hours. Anemia Panel: No results for input(s): VITAMINB12, FOLATE, FERRITIN, TIBC, IRON, RETICCTPCT in the last 72 hours. Urine analysis:    Component Value Date/Time   COLORURINE STRAW (A) 05/07/2018  0213   APPEARANCEUR CLEAR 05/07/2018 0213   LABSPEC 1.005 05/07/2018 0213   PHURINE 7.0 05/07/2018 0213   GLUCOSEU NEGATIVE 05/07/2018 0213   HGBUR NEGATIVE  05/07/2018 0213   BILIRUBINUR NEGATIVE 05/07/2018 0213   KETONESUR NEGATIVE 05/07/2018 0213   PROTEINUR NEGATIVE 05/07/2018 0213   NITRITE NEGATIVE 05/07/2018 0213   LEUKOCYTESUR NEGATIVE 05/07/2018 0213    Radiological Exams on Admission: Ct Head Wo Contrast  Result Date: 05/07/2018 CLINICAL DATA:  84 year old female with abnormal gait and left facial droop. EXAM: CT HEAD WITHOUT CONTRAST TECHNIQUE: Contiguous axial images were obtained from the base of the skull through the vertex without intravenous contrast. COMPARISON:  Head CT dated 04/02/2013 FINDINGS: Brain: There is mild age-related atrophy. Moderate chronic microvascular ischemic changes noted. There is no acute intracranial hemorrhage. No mass effect or midline shift. No extra-axial fluid collection. Vascular: No hyperdense vessel or unexpected calcification. Skull: Normal. Negative for fracture or focal lesion. Sinuses/Orbits: Small bilateral maxillary sinus retention cysts or polyps. No air-fluid levels. The mastoid air cells are clear. Other: None IMPRESSION: 1. No acute intracranial hemorrhage. 2. Age-related atrophy and chronic microvascular ischemic changes. Electronically Signed   By: Anner Crete M.D.   On: 05/07/2018 02:36   CORONARY STENT INTERVENTION LEFT HEART CATH AND CORONARY ANGIOGRAPHY                       Study date: 02/26/18   Dist RCA lesion is 90% stenosed.  Prox LAD lesion is 95% stenosed.  Ost 1st Mrg lesion is 70% stenosed.  A drug-eluting stent was successfully placed using a STENT SIERRA 3.50 X 15 MM.  Post intervention, there is a 0% residual stenosis.  Post intervention, there is a 0% residual stenosis.  A drug-eluting stent was successfully placed using a STENT SIERRA 3.00 X 15 MM.  LV end diastolic pressure is normal.   1. 3  vessel obstructive CAD.     - focal 95% proximal LAD    - 70% OM1    - 90% focal distal RCA 2. Normal LVEDP 3. Successful PCI of the Distal RCA with DES x 1 4. Successful PCI of the proximal LAD with DES x 1  Myoview is abnormal. EF 57%.  EKG: Independently reviewed. Vent. rate 77 BPM PR interval * ms QRS duration 90 ms QT/QTc 395/447 ms P-R-T axes 64 -41 44 Sinus rhythm Ventricular premature complex Abnormal R-wave progression, early transition Inferior infarct, old  Assessment/Plan Principal Problem:   Acute left-sided weakness Observation/telemetry. Frequent neuro checks. PT/OT/SLP. Check fasting lipids and hemoglobin A1c. Check echocardiogram. Had a recent carotid Doppler 2 months ago. Check MRI/MRA to brain without contrast. Consult neurology if needed.  Active Problems:   Type II diabetes mellitus (HCC) Carbohydrate modified diet, pending swallow screen. She is on glipizide and metformin at home. CBG monitoring with regular insulin sliding scale. Check hemoglobin A1c.    HYPERCHOLESTEROLEMIA On Crestor. Check fasting lipids.    Essential hypertension Allow permissive hypertension. Monitor blood pressure.    Coronary atherosclerosis On Plavix, warfarin, metoprolol and rosuvastatin.    GERD (gastroesophageal reflux disease) Resume Protonix pending swallow test.    Nonalcoholic hepatosteatosis Resume Actigall once the patient passes swallow test.    DVT prophylaxis:  Code Status:  Family Communication:  Disposition Plan:  Consults called:  Admission status:   Reubin Milan MD [facility] Pager [phone]  If 7PM-7AM, please contact night-coverage [email or web address] Password [email or web address]  05/07/2018, 4:59 AM

## 2018-05-07 NOTE — Plan of Care (Signed)
Pt has significant weakness on the left side. Left leg showing significant weakness. Left facial droop. Patient even states left visual field seems a little blurry. Decreased sensation left side as well. Discussed with patient stroke risk factors and potential tests that may have to be completed today.

## 2018-05-07 NOTE — Progress Notes (Signed)
RN went into patient room to administer medication. Patient with food tray in front of her eating. My understanding is that she was to be NPO until speech evaluated her. Educated patient on importance of waiting until speech can evaluate. Speech called and this RN left a voicemail. Patient did cough after a bite of food. Will continue to closely monitor.

## 2018-05-07 NOTE — Evaluation (Signed)
Physical Therapy Evaluation Patient Details Name: [name] MRN: [id number] DOB: [birth year] Today's Date: 05/07/2018   History of Present Illness  [name] is a 84 y.o. female with medical history significant of allergic rhinitis, arthritis, colon polyp, coronary artery disease, GERD, history of blood transfusion, hyperlipidemia, hypertension, nonalcoholic hepatosteatosis, TIA, type 2 diabetes mellitus.,  Per patient she has been having symptoms since Saturday.  However, her husband reported her symptoms started yesterday.  In any case, the symptoms that are reported are left facial droop and left-sided extremities weakness.  She stated that she has had a headache for several days. MRI positive for acute CVA    Clinical Impression  Patient unsteady on feet demonstrating slight incoordination of LLE result in increased risk for falls, leans over RW pushing to far in front requiring frequent verbal/tactile cueing to step closer to RW, limited secondary to fatigue and transferred to chair briefly before having to go back to bed for procedure ([place] of heart).  Patient will benefit from continued physical therapy in hospital and recommended venue below to increase strength, balance, endurance for safe ADLs and gait.    Follow Up Recommendations SNF    Equipment Recommendations  None recommended by PT    Recommendations for Other Services       Precautions / Restrictions Precautions Precautions: Fall Restrictions Weight Bearing Restrictions: No      Mobility  Bed Mobility Overal bed mobility: Needs Assistance Bed Mobility: Supine to Sit;Sit to Supine     Supine to sit: Min assist;Mod assist Sit to supine: Min assist   General bed mobility comments: slow labored movement  Transfers Overall transfer level: Needs assistance Equipment used: Rolling walker (2 wheeled) Transfers: Sit to/from [facility] Sit to Stand: Min assist;Mod assist Stand pivot transfers:  Min assist;Mod assist       General transfer comment: limited due to left sided weakness  Ambulation/Gait Ambulation/Gait assistance: Min assist;Mod assist Gait Distance (Feet): 35 Feet Assistive device: Rolling walker (2 wheeled) Gait Pattern/deviations: Decreased step length - left;Decreased stance time - left;Decreased stride length Gait velocity: decreased   General Gait Details: slow labored cadence with mild incoordination of LLE, tends to push RW to far in front, unsteady on feet  Stairs            Wheelchair Mobility    Modified Rankin (Stroke Patients Only)       Balance Overall balance assessment: Needs assistance Sitting-balance support: Feet supported;No upper extremity supported Sitting balance-Leahy Scale: Good     Standing balance support: Bilateral upper extremity supported;During functional activity Standing balance-Leahy Scale: Fair Standing balance comment: using RW                             Pertinent Vitals/Pain Pain Assessment: 0-10 Pain Score: 2  Pain Location: LUE/LLE Pain Descriptors / Indicators: Tingling;Discomfort Pain Intervention(s): Monitored during session    Home Living Family/patient expects to be discharged to:: Private residence Living Arrangements: Spouse/significant other   Type of Home: House Home Access: Stairs to enter Entrance Stairs-Rails: Right;Left;Can reach both Technical brewer of Steps: 4 Home Layout: One level Home Equipment: Cane - single point;Walker - 4 wheels;Toilet riser      Prior Function Level of Independence: Independent with assistive device(s)         Comments: community ambulator using SPC     Hand Dominance   Dominant Hand: Right    Extremity/Trunk Assessment  Upper Extremity Assessment Upper Extremity Assessment: Defer to OT evaluation    Lower Extremity Assessment Lower Extremity Assessment: Generalized weakness;RLE deficits/detail;LLE deficits/detail RLE  Deficits / Details: RLE grossly -5/5 LLE Deficits / Details: grossly 3+/5    Cervical / Trunk Assessment Cervical / Trunk Assessment: Kyphotic  Communication   Communication: No difficulties  Cognition Arousal/Alertness: Awake/alert Behavior During Therapy: WFL for tasks assessed/performed Overall Cognitive Status: Within Functional Limits for tasks assessed                                        General Comments      Exercises     Assessment/Plan    PT Assessment Patient needs continued PT services  PT Problem List Decreased strength;Decreased activity tolerance;Decreased balance;Decreased mobility       PT Treatment Interventions Gait training;Stair training;Functional mobility training;Therapeutic activities;Therapeutic exercise;Patient/family education;Neuromuscular re-education    PT Goals (Current goals can be found in the Care Plan section)  Acute Rehab PT Goals Patient Stated Goal: return home after rehab PT Goal Formulation: With patient/family Time For Goal Achievement: 05/21/18 Potential to Achieve Goals: Good    Frequency 7X/week   Barriers to discharge        Co-evaluation               AM-PAC PT "6 Clicks" Mobility  Outcome Measure Help needed turning from your back to your side while in a flat bed without using bedrails?: Total Help needed moving from lying on your back to sitting on the side of a flat bed without using bedrails?: Total Help needed moving to and from a bed to a chair (including a wheelchair)?: Total Help needed standing up from a chair using your arms (e.g., wheelchair or bedside chair)?: A Lot Help needed to walk in hospital room?: A Lot Help needed climbing 3-5 steps with a railing? : A Lot 6 Click Score: 9    End of Session Equipment Utilized During Treatment: Gait belt Activity Tolerance: Patient tolerated treatment well;Patient limited by fatigue Patient left: in bed;with call bell/phone within  reach;with family/visitor present(US tech in room to take [place] of heart) Nurse Communication: Mobility status PT Visit Diagnosis: Unsteadiness on feet (R26.81);Other abnormalities of gait and mobility (R26.89);Muscle weakness (generalized) (M62.81)    Time: 2725-3664 PT Time Calculation (min) (ACUTE ONLY): 25 min   Charges:   PT Evaluation $PT Eval Moderate Complexity: 1 Mod PT Treatments $Therapeutic Activity: 23-37 mins        12:27 PM, 05/07/18 Lonell Grandchild, MPT Physical Therapist with [facility] [phone] office [phone] mobile phone

## 2018-05-07 NOTE — Evaluation (Signed)
Occupational Therapy Evaluation Patient Details Name: [name] MRN: [id number] DOB: 12/18/1927 Today's Date: 05/07/2018    History of Present Illness [name] is a 84 y.o. female with medical history significant of allergic rhinitis, arthritis, colon polyp, coronary artery disease, GERD, history of blood transfusion, hyperlipidemia, hypertension, nonalcoholic hepatosteatosis, TIA, type 2 diabetes mellitus.,  Per patient she has been having symptoms since Saturday.  However, her husband reported her symptoms started yesterday.  In any case, the symptoms that are reported are left facial droop and left-sided extremities weakness.  She stated that she has had a headache for several days. MRI positive for acute CVA.   Clinical Impression   Pt received supine in bed, agreeable to OT evaluation. PTA pt independent in ADLs, using SPC for functional mobility. Evaluation limited to bed level due to MRI transport arriving during evaluation prior to mobility and ADL completion. Pt with significant LUE weakness, reporting tingling sensation however light touch is intact. Pt also demonstrating potential left homonymous hemianopsia. Will continue to follow and complete ADL assessment when pt is available. Recommend SNF on discharge to improve safety and independence in ADL completion.     Follow Up Recommendations    SNF   Equipment Recommendations  None recommended by OT       Precautions / Restrictions Precautions Precautions: Fall Restrictions Weight Bearing Restrictions: No      Mobility Bed Mobility               General bed mobility comments: Not completed due to pt leaving for MRI  Transfers Overall transfer level: Needs assistance   Transfers: Lateral/Scoot Transfers          Lateral/Scoot Transfers: Min assist;Mod assist;+2 physical assistance General transfer comment: Pt able to laterally scoot from bed to stretcher for transport to MRI         ADL either  performed or assessed with clinical judgement   ADL Overall ADL's : Needs assistance/impaired                                       General ADL Comments: Pt taken to MRI prior to ADL completion. Per pt report she is requiring significant assistance with transfering to [facility] for toileting     Vision Baseline Vision/History: Wears glasses Wears Glasses: Reading only Patient Visual Report: No change from baseline Vision Assessment?: Yes Eye Alignment: Within Functional Limits Ocular Range of Motion: Within Functional Limits Alignment/Gaze Preference: Within Defined Limits Tracking/Visual Pursuits: Able to track stimulus in all quads without difficulty Saccades: Within functional limits Convergence: Within functional limits Visual Fields: Left homonymous hemianopsia            Pertinent Vitals/Pain Pain Assessment: No/denies pain     Hand Dominance Right   Extremity/Trunk Assessment Upper Extremity Assessment Upper Extremity Assessment: LUE deficits/detail LUE Deficits / Details: LUE strength: 2-/5, elbow 2/5, wrist 2/5, decreased grip strength LUE Sensation: WNL LUE Coordination: decreased fine motor;decreased gross motor   Lower Extremity Assessment Lower Extremity Assessment: Defer to PT evaluation       Communication Communication Communication: No difficulties   Cognition Arousal/Alertness: Awake/alert Behavior During Therapy: WFL for tasks assessed/performed Overall Cognitive Status: Within Functional Limits for tasks assessed  Home Living Family/patient expects to be discharged to:: Private residence Living Arrangements: Spouse/significant other Available Help at Discharge: Family;Available PRN/intermittently Type of Home: House Home Access: Stairs to enter [employer] of Steps: 4 Entrance Stairs-Rails: Right;Left;Can reach both Home Layout: One level     Bathroom  Shower/Tub: Teacher, early years/pre: Standard     Home Equipment: Cane - single point;Walker - 4 wheels          Prior Functioning/Environment Level of Independence: Independent with assistive device(s)        Comments: Pt independent in ADLs, uses SPC for functional mobility        OT Problem List: Decreased strength;Decreased activity tolerance;Impaired balance (sitting and/or standing);Impaired vision/perception;Decreased coordination;Decreased safety awareness;Decreased knowledge of use of DME or AE;Impaired UE functional use;Impaired sensation      OT Treatment/Interventions: Self-care/ADL training;Therapeutic exercise;Neuromuscular education;DME and/or AE instruction;Therapeutic activities;Visual/perceptual remediation/compensation;Patient/family education    OT Goals(Current goals can be found in the care plan section) Acute Rehab OT Goals Patient Stated Goal: to go home OT Goal Formulation: With patient Time For Goal Achievement: 05/21/18 Potential to Achieve Goals: Good  OT Frequency: Min 2X/week    AM-PAC OT "6 Clicks" Daily Activity     Outcome Measure Help from another person eating meals?: None Help from another person taking care of personal grooming?: A Little Help from another person toileting, which includes using toliet, bedpan, or urinal?: A Lot Help from another person bathing (including washing, rinsing, drying)?: A Lot Help from another person to put on and taking off regular upper body clothing?: A Little Help from another person to put on and taking off regular lower body clothing?: A Lot 6 Click Score: 16   End of Session    Activity Tolerance: Patient tolerated treatment well Patient left: Other (comment)(with MRI transport)  OT Visit Diagnosis: Muscle weakness (generalized) (M62.81);Hemiplegia and hemiparesis Hemiplegia - Right/Left: Left Hemiplegia - dominant/non-dominant: Non-Dominant Hemiplegia - caused by: Cerebral infarction                 Time: 4707-6151 OT Time Calculation (min): 18 min Charges:  OT General Charges $OT Visit: 1 Visit OT Evaluation $OT Eval Low Complexity: 1 Low  Guadelupe Sabin, OTR/L  [phone] 05/07/2018, 8:39 AM

## 2018-05-07 NOTE — Plan of Care (Signed)
  Problem: Acute Rehab PT Goals(only PT should resolve) Goal: Pt Will Go Supine/Side To Sit Outcome: Progressing Flowsheets (Taken 05/07/2018 1229) Pt will go Supine/Side to Sit: with min guard assist Goal: Patient Will Transfer Sit To/From Stand Outcome: Progressing Flowsheets (Taken 05/07/2018 1229) Patient will transfer sit to/from stand: with min guard assist Goal: Pt Will Transfer Bed To Chair/Chair To Bed Outcome: Progressing Flowsheets (Taken 05/07/2018 1229) Pt will Transfer Bed to Chair/Chair to Bed: min guard assist Goal: Pt Will Ambulate Outcome: Progressing Flowsheets (Taken 05/07/2018 1229) Pt will Ambulate: 50 feet; with minimal assist; with rolling walker   12:30 PM, 05/07/18 Lonell Grandchild, MPT Physical Therapist with [facility] [phone] office [phone] mobile phone

## 2018-05-07 NOTE — ED Triage Notes (Signed)
Spouse called [facility] stated she was having left sided weakness, abnormal gait, and left facial droop. Symptoms began at 1000 dec 2nd.

## 2018-05-08 ENCOUNTER — Inpatient Hospital Stay (HOSPITAL_COMMUNITY): Payer: Medicare Other

## 2018-05-08 DIAGNOSIS — I639 Cerebral infarction, unspecified: Secondary | ICD-10-CM

## 2018-05-08 DIAGNOSIS — E7800 Pure hypercholesterolemia, unspecified: Secondary | ICD-10-CM

## 2018-05-08 DIAGNOSIS — K760 Fatty (change of) liver, not elsewhere classified: Secondary | ICD-10-CM

## 2018-05-08 LAB — COMPREHENSIVE METABOLIC PANEL
ALT: 16 U/L (ref 0–44)
AST: 16 U/L (ref 15–41)
Albumin: 3.9 g/dL (ref 3.5–5.0)
Alkaline Phosphatase: 108 U/L (ref 38–126)
Anion gap: 8 (ref 5–15)
BUN: 14 mg/dL (ref 8–23)
CHLORIDE: 107 mmol/L (ref 98–111)
CO2: 25 mmol/L (ref 22–32)
Calcium: 9.6 mg/dL (ref 8.9–10.3)
Creatinine, Ser: 0.69 mg/dL (ref 0.44–1.00)
GFR calc Af Amer: >60 mL/min (ref >60)
GFR calc non Af Amer: >60 mL/min (ref >60)
Glucose, Bld: 145 mg/dL — ABNORMAL HIGH (ref 70–99)
Potassium: 3.6 mmol/L (ref 3.5–5.1)
Sodium: 140 mmol/L (ref 135–145)
Total Bilirubin: 0.4 mg/dL (ref 0.3–1.2)
Total Protein: 7 g/dL (ref 6.5–8.1)

## 2018-05-08 LAB — PROTIME-INR
INR: 2.54
Prothrombin Time: 26.9 seconds — ABNORMAL HIGH (ref 11.4–15.2)

## 2018-05-08 LAB — MAGNESIUM: Magnesium: 2 mg/dL (ref 1.7–2.4)

## 2018-05-08 LAB — LIPID PANEL
CHOLESTEROL: 251 mg/dL — AB (ref 0–200)
HDL: 51 mg/dL (ref >40)
LDL Cholesterol: 181 mg/dL — ABNORMAL HIGH (ref 0–99)
Total CHOL/HDL Ratio: 4.9 RATIO
Triglycerides: 97 mg/dL (ref <150)
VLDL: 19 mg/dL (ref 0–40)

## 2018-05-08 LAB — GLUCOSE, CAPILLARY
GLUCOSE-CAPILLARY: 171 mg/dL — AB (ref 70–99)
Glucose-Capillary: 140 mg/dL — ABNORMAL HIGH (ref 70–99)
Glucose-Capillary: 159 mg/dL — ABNORMAL HIGH (ref 70–99)
Glucose-Capillary: 151 mg/dL — ABNORMAL HIGH (ref 70–99)

## 2018-05-08 LAB — CBC
HCT: 40.8 % (ref 36.0–46.0)
Hemoglobin: 13 g/dL (ref 12.0–15.0)
MCH: 29.9 pg (ref 26.0–34.0)
MCHC: 31.9 g/dL (ref 30.0–36.0)
MCV: 93.8 fL (ref 80.0–100.0)
Platelets: 213 10*3/uL (ref 150–400)
RBC: 4.35 MIL/uL (ref 3.87–5.11)
RDW: 11.9 % (ref 11.5–15.5)
WBC: 7.9 10*3/uL (ref 4.0–10.5)
nRBC: 0 % (ref 0.0–0.2)

## 2018-05-08 NOTE — Progress Notes (Signed)
Occupational Therapy Treatment Patient Details Name: [name] MRN: [id number] DOB: December 18, 1927 Today's Date: 05/08/2018    History of present illness [name] is a 84 y.o. female with medical history significant of allergic rhinitis, arthritis, colon polyp, coronary artery disease, GERD, history of blood transfusion, hyperlipidemia, hypertension, nonalcoholic hepatosteatosis, TIA, type 2 diabetes mellitus.,  Per patient she has been having symptoms since Saturday.  However, her husband reported her symptoms started yesterday.  In any case, the symptoms that are reported are left facial droop and left-sided extremities weakness.  She stated that she has had a headache for several days. MRI positive for acute CVA   OT comments  Pt received in bed, finishing breakfast upon OT arrival. Pt requiring assistance with positioning LUE to assist during ADL tasks due to weakness and coordination deficits. Pt fatigues easily during ADLs and functional transfers, verbal cuing required for LUE positioning. Cuing also required for safety and sequencing with RW use. Discharge to SNF remains appropriate.    Follow Up Recommendations  SNF    Equipment Recommendations  None recommended by OT       Precautions / Restrictions Precautions Precautions: Fall Restrictions Weight Bearing Restrictions: No       Mobility Bed Mobility Overal bed mobility: Needs Assistance Bed Mobility: Supine to Sit     Supine to sit: Min assist     General bed mobility comments: slow labored movement  Transfers Overall transfer level: Needs assistance Equipment used: Rolling walker (2 wheeled) Transfers: Sit to/from [facility] Sit to Stand: Mod assist Stand pivot transfers: Min assist;Mod assist       General transfer comment: increasing assistance required during task due to fatigue        ADL either performed or assessed with clinical judgement   ADL Overall ADL's : Needs  assistance/impaired Eating/Feeding: Minimal assistance;Bed level Eating/Feeding Details (indicate cue type and reason): Pt attempting to use left hand to hold applesauce container, max difficulty due to hand weakness. OT providing min assist under elbow for bringing arm into correct position, pt then able to grasp container and maintain hold until finished with applesauce.  Grooming: Oral care;Minimal assistance;Sitting Grooming Details (indicate cue type and reason): Pt requiring min assist for maintaining hold on container with left hand while brushing teeth with right hand                 Toilet Transfer: Moderate assistance;Stand-pivot;BSC;RW   Toileting- Clothing Manipulation and Hygiene: Minimal assistance;Sitting/lateral lean;Sit to/from stand         General ADL Comments: Pt requiring increased assistance for ADL completion due to LUE weakness and coordination deficits. Pt unable to complete tasks in standing due to weakness and balance deficits.      Vision   Vision Assessment?: Yes Eye Alignment: Within Functional Limits Ocular Range of Motion: Within Functional Limits Alignment/Gaze Preference: Within Defined Limits Tracking/Visual Pursuits: Able to track stimulus in all quads without difficulty Saccades: Within functional limits Convergence: Within functional limits Visual Fields: Left homonymous hemianopsia          Cognition Arousal/Alertness: Awake/alert Behavior During Therapy: WFL for tasks assessed/performed Overall Cognitive Status: Within Functional Limits for tasks assessed  Pertinent Vitals/ Pain       Pain Assessment: No/denies pain         Frequency  Min 2X/week        Progress Toward Goals  OT Goals(current goals can now be found in the care plan section)  Progress towards OT goals: Progressing toward goals  Acute Rehab OT Goals Patient Stated Goal: return  home after rehab OT Goal Formulation: With patient Time For Goal Achievement: 05/21/18 Potential to Achieve Goals: Good ADL Goals Pt Will Perform Eating: with set-up;sitting Pt Will Perform Grooming: with set-up;sitting;standing Pt Will Perform Upper Body Dressing: with min assist;sitting;standing Pt Will Perform Lower Body Dressing: with min assist;sit to/from stand;sitting/lateral leans Pt Will Transfer to Toilet: with min assist;stand pivot transfer;ambulating;regular height toilet;bedside commode Pt Will Perform Toileting - Clothing Manipulation and hygiene: with supervision;sitting/lateral leans;sit to/from stand Pt/caregiver will Perform Home Exercise Program: Increased strength;Left upper extremity;Independently;With written HEP provided  Plan Discharge plan remains appropriate          End of Session Equipment Utilized During Treatment: Gait belt;Rolling walker  OT Visit Diagnosis: Muscle weakness (generalized) (M62.81);Hemiplegia and hemiparesis Hemiplegia - Right/Left: Left Hemiplegia - dominant/non-dominant: Non-Dominant Hemiplegia - caused by: Cerebral infarction   Activity Tolerance Patient tolerated treatment well   Patient Left in chair;with call bell/phone within reach;Other (comment)(with PT)   Nurse Communication          Time: 3435-6861 OT Time Calculation (min): 34 min  Charges: OT General Charges $OT Visit: 1 Visit OT Treatments $Self Care/Home Management : 23-37 mins   Guadelupe Sabin, OTR/L  [phone] 05/08/2018, 10:07 AM

## 2018-05-08 NOTE — NC FL2 (Signed)
[place] LEVEL OF CARE SCREENING TOOL     IDENTIFICATION  Patient Name: [name] Birthdate: Apr 17, 1928 Sex: female Admission Date (Current Location): 05/07/2018  [facility] and [place] Number:  [employer] and Address:  [place] [address], [place]      Provider Number: [id number]  Attending Physician Name and Address:  Murlean Iba, MD  Relative Name and Phone Number:  Salvadore Oxford (husband) [phone]    Current Level of Care: Hospital Recommended Level of Care: [place] Prior Approval Number:    Date Approved/Denied:   PASRR Number: [id number]  Discharge Plan: SNF    Current Diagnoses: Patient Active Problem List   Diagnosis Date Noted  . Acute left-sided weakness 05/07/2018  . Nonalcoholic hepatosteatosis 16/83/7290  . Acute CVA (cerebrovascular accident) ([place]) 05/07/2018  . Weakness 05/07/2018  . Unstable angina ([place]) 02/26/2018  . TIA (transient ischemic attack) 02/07/2011  . PFO (patent foramen ovale) 02/07/2011  . Long term (current) use of anticoagulants 02/07/2011  . Type II diabetes mellitus ([place]) 09/30/2008  . CHANGE IN BOWELS 09/30/2008  . HYPERCHOLESTEROLEMIA 09/29/2008  . Essential hypertension 09/29/2008  . Coronary atherosclerosis 09/29/2008  . VENTRICULAR HYPERTROPHY, LEFT 09/29/2008  . ALLERGIC RHINITIS, CHRONIC 09/29/2008  . OTHER CHRONIC NONALCOHOLIC LIVER DISEASE 21/04/5519  . LIVER FUNCTION TESTS, ABNORMAL, HX OF 09/29/2008  . COLONIC POLYPS, HX OF 09/29/2008  . GERD (gastroesophageal reflux disease) 09/29/2008    Orientation RESPIRATION BLADDER Height & Weight     Self, Time, Situation, Place  Normal Continent Weight: 161 lb 2.5 oz (73.1 kg) Height:  5' 4"  (162.6 cm)  BEHAVIORAL SYMPTOMS/MOOD NEUROLOGICAL BOWEL NUTRITION STATUS      Continent Diet(see dc summary)  AMBULATORY STATUS COMMUNICATION OF NEEDS Skin   Extensive Assist Verbally Normal                        Personal Care Assistance Level of Assistance  Bathing, Feeding, Dressing Bathing Assistance: Limited assistance Feeding assistance: Independent Dressing Assistance: Limited assistance     Functional Limitations Info  Sight, Hearing, Speech Sight Info: Adequate Hearing Info: Adequate Speech Info: Adequate    SPECIAL CARE FACTORS FREQUENCY  PT (By licensed PT)     PT Frequency: 5 times week              Contractures Contractures Info: Not present    Additional Factors Info  Code Status, Allergies Code Status Info: Full Allergies Info: Ivp dye, sulfonamide Derivitives, lipitor, zetia, penicillins           Current Medications (05/08/2018):  This is the current hospital active medication list Current Facility-Administered Medications  Medication Dose Route Frequency Provider Last Rate Last Dose  . clopidogrel (PLAVIX) tablet 75 mg  75 mg Oral Q breakfast Wynetta Emery, Clanford L, MD   75 mg at 05/08/18 8022  . docusate sodium (COLACE) capsule 200 mg  200 mg Oral BID PRN Johnson, Clanford L, MD      . fluticasone (FLONASE) 50 MCG/ACT nasal spray 2 spray  2 spray Each Nare Daily Johnson, Clanford L, MD   2 spray at 05/07/18 1717  . insulin aspart (novoLOG) injection 0-9 Units  0-9 Units Subcutaneous TID WC Reubin Milan, MD   2 Units at 05/08/18 1218  . loratadine (CLARITIN) tablet 10 mg  10 mg Oral Daily Johnson, Clanford L, MD   10 mg at 05/08/18 0949  . multivitamin with minerals  tablet 1 tablet  1 tablet Oral Daily Wynetta Emery, Clanford L, MD   1 tablet at 05/08/18 0949  . nitroGLYCERIN (NITROSTAT) SL tablet 0.4 mg  0.4 mg Sublingual Q5 min PRN Johnson, Clanford L, MD      . pantoprazole (PROTONIX) EC tablet 40 mg  40 mg Oral Daily Johnson, Clanford L, MD   40 mg at 05/08/18 0949  . polyvinyl alcohol (LIQUIFILM TEARS) 1.4 % ophthalmic solution 1 drop  1 drop Both Eyes BID PRN Johnson, Clanford L, MD      . rosuvastatin (CRESTOR) tablet 5 mg  5 mg Oral  Weekly Johnson, Clanford L, MD   5 mg at 05/07/18 1718  . ursodiol (ACTIGALL) capsule 300 mg  300 mg Oral BID Wynetta Emery, Clanford L, MD   300 mg at 05/08/18 5241     Discharge Medications: Please see discharge summary for a list of discharge medications.  Relevant Imaging Results:  Relevant Lab Results:   Additional Information SSN: [id number] [place]

## 2018-05-08 NOTE — Progress Notes (Signed)
  Speech Language Pathology Treatment: Dysphagia  Patient Details Name: [name] MRN: [id number] DOB: Dec 16, 1927 Today's Date: 05/08/2018 Time: 8309-4076 SLP Time Calculation (min) (ACUTE ONLY): 18 min  Assessment / Plan / Recommendation Clinical Impression  Pt seen at bedside for ongoing diagnostic dysphagia intervention. RN reports some coughing this AM when Pt taking pills whole with water and spouse reported some coughing with lunch meal. Pt tells SLP that she has always coughed some with cold water. Pt observed with ice water via cup and straw and graham crackers, all self presented. Left facial asymmetry persists and Pt with mild pocketing with solids in left buccal cavity. Pt with delayed cough x2 when drinking water after regular textures. Will proceed with MBSS tomorrow later morning.    HPI HPI: [name] is a 84 y.o. female with medical history significant of allergic rhinitis, arthritis, colon polyp, coronary artery disease, GERD, history of blood transfusion, hyperlipidemia, hypertension, nonalcoholic hepatosteatosis, TIA, type 2 diabetes mellitus.,  Per patient she has been having symptoms since Saturday.  However, her husband reported her symptoms started yesterday.  In any case, the symptoms that are reported are left facial droop and left-sided extremities weakness.  She stated that she has had a headache for several days. MRI positive for acute CVA.      SLP Plan  MBS(tomorrow)       Recommendations  Diet recommendations: Dysphagia 3 (mechanical soft);Thin liquid Liquids provided via: Cup Medication Administration: Whole meds with puree Supervision: Patient able to self feed;Intermittent supervision to cue for compensatory strategies Compensations: Slow rate;Small sips/bites;Lingual sweep for clearance of pocketing Postural Changes and/or Swallow Maneuvers: Seated upright 90 degrees;Upright 30-60 min after meal                Plan: MBS(tomorrow)        Thank you,  Genene Churn, Loco                 [place] 05/08/2018, 3:14 PM

## 2018-05-08 NOTE — Progress Notes (Addendum)
PROGRESS NOTE    [name]  KGS:[id number]  DOB: 1928-01-07  DOA: 05/07/2018 PCP: Loman Brooklyn, FNP   Brief Admission Hx: 84 y.o. female with medical history significant of allergic rhinitis, arthritis, colon polyp, coronary artery disease, GERD, history of blood transfusion, hyperlipidemia, hypertension, nonalcoholic hepatosteatosis, TIA, type 2 diabetes mellitus admitted with acute CVA.   MDM/Assessment & Plan:   1. Acute CVA -appreciate neurology consultation.  The patient is going to be treated with dual antiplatelet therapy.  The patient has been taken off warfarin.  When the INR is normalized we do plan to start the patient on aspirin.  The patient is on Plavix at this time.  The patient has had an echocardiogram with normal ejection fraction and grade 1 diastolic dysfunction reported.  PT has evaluated the patient and recommended SNF placement.  A 30-day event monitor has been requested and will be placed today by the heartcare team. 2. 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  Her oral medications have been held while she is in the hospital.  She is being treated with a supplemental sliding scale insulin with frequent CBG testing. 3. Essential hypertension-we are allowing for some permissive hypertension in the setting of acute CVA with plans to normalize blood pressure over the next days. 4. GERD-resume Protonix. 5. Nonalcoholic hepato-steatosis- resume Actigall. 6. Diabetic dyslipidemia-continue home Crestor.  DVT prophylaxis: SCDs Code Status: FULL  Family Communication: husband updated  Disposition Plan: SNF when bed available  Echocardiogram  Study Conclusions - Left ventricle: The cavity size was normal. Wall thickness was   increased in a pattern of mild LVH. Systolic function was normal.   The  estimated ejection fraction was in the range of 60% to 65%.   Wall motion was normal; there were no regional wall motion   abnormalities. Doppler parameters are consistent with abnormal   left ventricular relaxation (grade 1 diastolic dysfunction). - Aortic valve: Moderately calcified annulus. Probably trileaflet;   moderately calcified leaflets. There was moderate stenosis. Mean   gradient (S): 11 mm Hg. Peak gradient (S): 21 mm Hg. VTI ratio of   LVOT to aortic valve: 0.44. Valve area (VTI): 1.24 cm^2. Valve   area (Vmax): 1.23 cm^2. - Mitral valve: Mildly to moderately calcified annulus. Mildly   thickened leaflets . There was trivial regurgitation. - Right atrium: Central venous pressure (est): 3 mm Hg. - Atrial septum: No defect or patent foramen ovale was identified. - Tricuspid valve: There was mild regurgitation. - Pulmonary arteries: PA peak pressure: 21 mm Hg (S). - Pericardium, extracardiac: A prominent pericardial fat pad was   present.  Consultants:  Neurology   Procedures:    Antimicrobials:     Subjective: Pt says that she feels the same as she felt yesterday.   Objective: Vitals:   05/08/18 0229 05/08/18 0240 05/08/18 0631 05/08/18 1000  BP: (!) 151/82  (!) 152/70 140/77  Pulse: 86  94 85  Resp:      Temp: 98 F (36.7 C)  97.8 F (36.6 C) 98.1 F (36.7 C)  TempSrc: Oral  Oral Oral  SpO2: 92% 97% 98%   Weight:      Height:        Intake/Output Summary (Last 24 hours) at 05/08/2018 1339 Last data filed at 05/08/2018 1000 Gross per 24 hour  Intake 240 ml  Output -  Net 240 ml   Filed Weights   05/07/18 0136 05/07/18 0455  Weight: 72.6 kg 73.1 kg    REVIEW OF SYSTEMS  As per history otherwise all reviewed and reported negative  Exam:  General exam: awake, alert, NAD, eating breakfast and cooperative.  Respiratory system: Clear. No increased work of breathing. Cardiovascular system: S1 & S2 heard, RRR. No JVD, murmurs, gallops, clicks or  pedal edema. Gastrointestinal system: Abdomen is nondistended, soft and nontender. Normal bowel sounds heard. Central nervous system: Alert and oriented. Left hemiparesis.  Extremities: no cyanosis or clubbing.  Data Reviewed: Basic Metabolic Panel: Recent Labs  Lab 05/07/18 0258 05/07/18 0310 05/07/18 0347 05/08/18 0517  NA 141 142  --  140  K 4.0 3.8  --  3.6  CL 105 104  --  107  CO2 27  --   --  25  GLUCOSE 156* 152*  --  145*  BUN 11 11  --  14  CREATININE 0.58 0.50  --  0.69  CALCIUM 9.8  --   --  9.6  MG  --   --  1.6* 2.0  PHOS  --   --  3.1  --    Liver Function Tests: Recent Labs  Lab 05/07/18 0258 05/08/18 0517  AST 18 16  ALT 18 16  ALKPHOS 113 108  BILITOT 0.4 0.4  PROT 8.1 7.0  ALBUMIN 4.4 3.9   No results for input(s): LIPASE, AMYLASE in the last 168 hours. No results for input(s): AMMONIA in the last 168 hours. CBC: Recent Labs  Lab 05/07/18 0258 05/07/18 0310 05/08/18 0517  WBC 8.7  --  7.9  NEUTROABS 6.2  --   --   HGB 14.1 14.6 13.0  HCT 43.7 43.0 40.8  MCV 93.4  --  93.8  PLT 213  --  213   Cardiac Enzymes: No results for input(s): CKTOTAL, CKMB, CKMBINDEX, TROPONINI in the last 168 hours. CBG (last 3)  Recent Labs    05/07/18 2313 05/08/18 0757 05/08/18 1152  GLUCAP 121* 140* 159*   Recent Results (from the past 240 hour(s))  MRSA PCR Screening     Status: None   Collection Time: 05/07/18  4:40 AM  Result Value Ref Range Status   MRSA by PCR NEGATIVE NEGATIVE Final    Comment:        The GeneXpert MRSA Assay (FDA approved for NASAL specimens only), is one component of a comprehensive MRSA colonization surveillance program. It is not intended to diagnose MRSA infection nor to guide or monitor treatment for MRSA infections. Performed at [facility], [address] [place], [place] [address]     Studies: Ct Head Wo Contrast  Result Date: 05/07/2018 CLINICAL DATA:  84 year old female with abnormal gait and left facial  droop. EXAM: CT HEAD WITHOUT CONTRAST TECHNIQUE: Contiguous axial images were obtained from the base of the skull through the vertex without intravenous contrast. COMPARISON:  Head CT dated 04/02/2013 FINDINGS: Brain: There is mild age-related atrophy. Moderate chronic microvascular ischemic changes noted. There is no acute intracranial hemorrhage. No mass effect or midline shift. No extra-axial fluid collection. Vascular: No hyperdense vessel or unexpected calcification. Skull: Normal. Negative for fracture or focal lesion. Sinuses/Orbits: Small bilateral maxillary sinus retention cysts or polyps. No air-fluid levels. The mastoid air cells are clear. Other: None IMPRESSION: 1. No acute intracranial hemorrhage. 2. Age-related atrophy and chronic microvascular ischemic changes. Electronically Signed   By: Anner Crete M.D.   On: 05/07/2018  02:36   Mr Virgel Paling WC Contrast  Result Date: 05/07/2018 CLINICAL DATA:  Acute headache with normal neuro exam. Left-sided weakness and left facial droop EXAM: MRI HEAD WITHOUT CONTRAST MRA HEAD WITHOUT CONTRAST TECHNIQUE: Multiplanar, multiecho pulse sequences of the brain and surrounding structures were obtained without intravenous contrast. Angiographic images of the head were obtained using MRA technique without contrast. COMPARISON:  12/14/2016 brain MRI FINDINGS: MRI HEAD FINDINGS Brain: Patchy restricted diffusion in the right occipital lobe, cortical to subcortical. There is a punctate left occipital and right posterior frontal white matter infarcts. Wedge of restricted diffusion in the right pons respecting midline. No hemorrhage, hydrocephalus, or masslike finding. Chronic small vessel ischemia with extensive ischemic gliosis in the cerebral white matter. Remote lacunar infarcts in the bilateral thalami and parietal white matter. Vascular: Arterial findings below. Normal dural venous sinus flow voids. Skull and upper cervical spine: No evidence of marrow lesion  Sinuses/Orbits: Bilateral cataract resection MRA HEAD FINDINGS Symmetric carotid and vertebral arteries. Proximal V4 segment symmetric poor flow signal attributed to dephasing artifact. The carotid, vertebral, and basilar arteries are widely patent. High-grade multi for narrowing of left M1, left A2, and right more than left proximal PCA. Negative for aneurysm. IMPRESSION: Brain MRI: 1. Patchy acute infarct in the right occipital lobe. 2. Acute right pontine infarct. 3. Punctate white matter infarcts in the posterior right frontal and left occipital white matter. 4. Background of advanced chronic small vessel ischemia. Intracranial MRA: Advanced intracranial atherosclerosis with high-grade narrowings at the left M1, left A2, and right P2/3 segments. Electronically Signed   By: Monte Fantasia M.D.   On: 05/07/2018 08:51   Mr Brain Wo Contrast  Result Date: 05/07/2018 CLINICAL DATA:  Acute headache with normal neuro exam. Left-sided weakness and left facial droop EXAM: MRI HEAD WITHOUT CONTRAST MRA HEAD WITHOUT CONTRAST TECHNIQUE: Multiplanar, multiecho pulse sequences of the brain and surrounding structures were obtained without intravenous contrast. Angiographic images of the head were obtained using MRA technique without contrast. COMPARISON:  12/14/2016 brain MRI FINDINGS: MRI HEAD FINDINGS Brain: Patchy restricted diffusion in the right occipital lobe, cortical to subcortical. There is a punctate left occipital and right posterior frontal white matter infarcts. Wedge of restricted diffusion in the right pons respecting midline. No hemorrhage, hydrocephalus, or masslike finding. Chronic small vessel ischemia with extensive ischemic gliosis in the cerebral white matter. Remote lacunar infarcts in the bilateral thalami and parietal white matter. Vascular: Arterial findings below. Normal dural venous sinus flow voids. Skull and upper cervical spine: No evidence of marrow lesion Sinuses/Orbits: Bilateral cataract  resection MRA HEAD FINDINGS Symmetric carotid and vertebral arteries. Proximal V4 segment symmetric poor flow signal attributed to dephasing artifact. The carotid, vertebral, and basilar arteries are widely patent. High-grade multi for narrowing of left M1, left A2, and right more than left proximal PCA. Negative for aneurysm. IMPRESSION: Brain MRI: 1. Patchy acute infarct in the right occipital lobe. 2. Acute right pontine infarct. 3. Punctate white matter infarcts in the posterior right frontal and left occipital white matter. 4. Background of advanced chronic small vessel ischemia. Intracranial MRA: Advanced intracranial atherosclerosis with high-grade narrowings at the left M1, left A2, and right P2/3 segments. Electronically Signed   By: Monte Fantasia M.D.   On: 05/07/2018 08:51   Scheduled Meds: . clopidogrel  75 mg Oral Q breakfast  . fluticasone  2 spray Each Nare Daily  . insulin aspart  0-9 Units Subcutaneous TID WC  . loratadine  10 mg Oral  Daily  . multivitamin with minerals  1 tablet Oral Daily  . pantoprazole  40 mg Oral Daily  . rosuvastatin  5 mg Oral Weekly  . ursodiol  300 mg Oral BID   Continuous Infusions:  Principal Problem:   Acute CVA (cerebrovascular accident) ([place]) Active Problems:   Type II diabetes mellitus ([place])   HYPERCHOLESTEROLEMIA   Essential hypertension   Coronary atherosclerosis   GERD (gastroesophageal reflux disease)   Acute left-sided weakness   Nonalcoholic hepatosteatosis   Weakness  Time spent:   Irwin Brakeman, MD, FAAFP [facility] Pager [phone]  If 7PM-7AM, please contact night-coverage [email or web address] Password [email or web address] 05/08/2018, 1:39 PM    LOS: 1 day

## 2018-05-08 NOTE — Progress Notes (Signed)
   05/08/18 1630  Vitals  ECG Heart Rate (!) 141  Cardiac Rhythm ST   Received call from telemetry regarding sustained tachycardia. Patient up on the [facility]. After returning to bed, patient with HR in the low 100s. Will continue to closely monitor.

## 2018-05-08 NOTE — Progress Notes (Signed)
SLP Cancellation Note  Patient Details Name: [name] MRN: [id number] DOB: [birth year]   Cancelled treatment:       Reason Eval/Treat Not Completed: Patient at procedure or test/unavailable; Pt off floor for testing. SLP will check back as schedule permits. Pt's husband reports that pt is having increased difficulty in using her left arm this date compared to yesterday. He also indicated that she did have some coughing with her lunch meal. Can pursue MBSS tomorrow if indicated.   Thank you,  Genene Churn, [place]    [place] 05/08/2018, 2:24 PM

## 2018-05-08 NOTE — Clinical Social Work Note (Signed)
Clinical Social Work Assessment  Patient Details  Name: [name] MRN: [id number] Date of Birth: 1928-05-21  Date of referral:  05/08/18               Reason for consult:  Facility Placement, Discharge Planning                Permission sought to share information with:  Chartered certified accountant granted to share information::  Yes, Verbal Permission Granted  Name::        Agency::  [facility], [place]  Relationship::     Contact Information:     Housing/Transportation Living arrangements for the past 2 months:  [place] of Information:  Patient Patient Interpreter Needed:  None Criminal Activity/Legal Involvement Pertinent to Current Situation/Hospitalization:  No - Comment as needed Significant Relationships:  Adult Children, Spouse Lives with:  Spouse Do you feel safe going back to the place where you live?  Yes Need for family participation in patient care:  No (Coment)  Care giving concerns: PT recommends SNF rehab.   Social Worker assessment / plan: Pt is an 84 year old female referred to CSW for SNF rehab placement. Met with pt and her husband at bedside today to assess. Per pt, she and her husband live together in their home in [place]. Pt states that she is independent in ADLs at baseline. Discussed SNF rehab recommendation with pt who is agreeable. Provided pt with SNF options. Will refer to requested facilities and follow up with pt tomorrow to further assist with discharge planning needs.  Employment status:  Retired Forensic scientist:  Medicare PT Recommendations:  [place] / Referral to community resources:  [place]  Patient/Family's Response to care: Pt accepting of care.  Patient/Family's Understanding of and Emotional Response to Diagnosis, Current Treatment, and Prognosis: Pt appears to have a good understanding of diagnosis and treatment recommendations. No emotional  distress identified.  Emotional Assessment Appearance:  Appears stated age Attitude/Demeanor/Rapport:  Engaged Affect (typically observed):  Pleasant Orientation:  Oriented to Self, Oriented to Place, Oriented to  Time, Oriented to Situation Alcohol / Substance use:  Not Applicable Psych involvement (Current and /or in the community):  No (Comment)  Discharge Needs  Concerns to be addressed:  Discharge Planning Concerns Readmission within the last 30 days:  No Current discharge risk:  Physical Impairment Barriers to Discharge:  No Barriers Identified   Shade Flood, LCSW 05/08/2018, 1:00 PM

## 2018-05-08 NOTE — Progress Notes (Signed)
Physical Therapy Treatment Patient Details Name: [name] MRN: [id number] DOB: December 06, 1927 Today's Date: 05/08/2018    History of Present Illness [name] is a 84 y.o. female with medical history significant of allergic rhinitis, arthritis, colon polyp, coronary artery disease, GERD, history of blood transfusion, hyperlipidemia, hypertension, nonalcoholic hepatosteatosis, TIA, type 2 diabetes mellitus.,  Per patient she has been having symptoms since Saturday.  However, her husband reported her symptoms started yesterday.  In any case, the symptoms that are reported are left facial droop and left-sided extremities weakness.  She stated that she has had a headache for several days. MRI positive for acute CVA    PT Comments    Pt receiving OT at PTA entrance, pt sitting in chair and willing to participate.  Pt with cueing for hand placement and technique to scoot forward to edge of chair and sit to stand.  Mod A with transfer training and gait training with RW.  Pt tendency to push walker too far forward and incoordination with Lt LE, decreased stance phase on Lt LE as well as grip with Lt UE.  EOS pt left in chair with chair alarm set and call bell within reach.  No reports of pain through session.   Follow Up Recommendations  SNF     Equipment Recommendations  None recommended by PT    Recommendations for Other Services       Precautions / Restrictions Precautions Precautions: Fall Restrictions Weight Bearing Restrictions: No    Mobility  Bed Mobility Overal bed mobility: Needs Assistance Bed Mobility: Supine to Sit     Supine to sit: Min assist     General bed mobility comments: Pt sitting in chair upon therapist entrance  Transfers Overall transfer level: Needs assistance Equipment used: Rolling walker (2 wheeled) Transfers: Sit to/from Stand Sit to Stand: Mod assist Stand pivot transfers: Min assist;Mod assist      Lateral/Scoot Transfers: Min assist;Mod  assist General transfer comment: verbal cueing with handplacement to assist with scooting to edge of chair and hand placement for assistance wiht STS  Ambulation/Gait Ambulation/Gait assistance: Mod assist Gait Distance (Feet): 20 Feet Assistive device: Rolling walker (2 wheeled) Gait Pattern/deviations: Decreased step length - left;Decreased stance time - left;Decreased stride length Gait velocity: decreased   General Gait Details: slow labored cadence with mild incoordination of LLE, tends to push RW to far in front, unsteady on feet   Stairs             Wheelchair Mobility    Modified Rankin (Stroke Patients Only)       Balance                                            Cognition Arousal/Alertness: Awake/alert Behavior During Therapy: WFL for tasks assessed/performed Overall Cognitive Status: Within Functional Limits for tasks assessed                                        Exercises      General Comments        Pertinent Vitals/Pain Pain Assessment: No/denies pain    Home Living                      Prior Function  PT Goals (current goals can now be found in the care plan section) Acute Rehab PT Goals Patient Stated Goal: return home after rehab Progress towards PT goals: Progressing toward goals    Frequency    7X/week      PT Plan      Co-evaluation              AM-PAC PT "6 Clicks" Mobility   Outcome Measure  Help needed turning from your back to your side while in a flat bed without using bedrails?: Total Help needed moving from lying on your back to sitting on the side of a flat bed without using bedrails?: Total Help needed moving to and from a bed to a chair (including a wheelchair)?: Total Help needed standing up from a chair using your arms (e.g., wheelchair or bedside chair)?: A Lot Help needed to walk in hospital room?: A Lot Help needed climbing 3-5 steps with a  railing? : A Lot 6 Click Score: 9    End of Session Equipment Utilized During Treatment: Gait belt   Patient left: in chair;with call bell/phone within reach;with chair alarm set Nurse Communication: Mobility status PT Visit Diagnosis: Unsteadiness on feet (R26.81);Other abnormalities of gait and mobility (R26.89);Muscle weakness (generalized) (M62.81)     Time: 6945-0388 PT Time Calculation (min) (ACUTE ONLY): 15 min  Charges:  $Therapeutic Activity: 8-22 mins                     [address] LPTA; CBIS [phone] Aldona Lento 05/08/2018, 11:36 AM

## 2018-05-08 NOTE — Plan of Care (Signed)
  Problem: Self-Care: Goal: Ability to participate in self-care as condition permits will improve Outcome: Progressing  Patient still needs maximum assistance during transfers.

## 2018-05-09 ENCOUNTER — Inpatient Hospital Stay (HOSPITAL_COMMUNITY): Payer: Medicare Other

## 2018-05-09 LAB — GLUCOSE, CAPILLARY
Glucose-Capillary: 128 mg/dL — ABNORMAL HIGH (ref 70–99)
Glucose-Capillary: 142 mg/dL — ABNORMAL HIGH (ref 70–99)
Glucose-Capillary: 163 mg/dL — ABNORMAL HIGH (ref 70–99)
Glucose-Capillary: 156 mg/dL — ABNORMAL HIGH (ref 70–99)

## 2018-05-09 LAB — PROTIME-INR
INR: 2.18
Prothrombin Time: 24 seconds — ABNORMAL HIGH (ref 11.4–15.2)

## 2018-05-09 MED ORDER — METOPROLOL SUCCINATE ER 25 MG PO TB24
25.0000 mg | ORAL_TABLET | Freq: Every day | ORAL | Status: DC
  Administered 2018-05-09 – 2018-05-10 (×2): 25 mg via ORAL
  Filled 2018-05-09 (×2): qty 1

## 2018-05-09 MED ORDER — RESOURCE THICKENUP CLEAR PO POWD
ORAL | Status: DC | PRN
  Filled 2018-05-09: qty 125

## 2018-05-09 NOTE — Progress Notes (Signed)
Modified Barium Swallow Progress Note  Patient Details  Name: [name] MRN: [id number] Date of Birth: November 26, 1927  Today's Date: 05/09/2018  Modified Barium Swallow completed.  Full report located under Chart Review in the Imaging Section.  Brief recommendations include the following:  Clinical Impression  Pt presents with mild/mild oropharyngeal phase dysphagia characterized by (oral phase) weak lingual manipulation resulting in prolonged oral transit, premature spillage, and mild oral residuals; (pharyngeal phase) reduced timing of laryngeal vestibule closure resulting in rare flash penetration during the swallow with nectars and silent aspiration of thins via cup sip during the swallow occurring more frequently when taking large sips of thin. Pt with adequate hyolaryngeal excursion and good pharyngeal pressure. Pt with mild oral residuals which pool in the valleculae post primary swallow and are cleared with cue to dry swallow. Chin tuck was attempted and found to be ineffective in eliminating deep penetration. Pt was successful in swallowing small, cup sips of thin without aspiration on ~4 trials. Recommend D3/mech soft and NTL via cup sips, no straws with po meds whole in puree or NTL, encourage Pt to swallow 2x for each sip of liquid. Pt will need f/u SLP services at receiving facility to assist with liquid advancement as appropriate; trials small sips thin liquid with SLP is appropriate. Encourage strong cough. SLP will follow during acute stay.    Swallow Evaluation Recommendations       SLP Diet Recommendations: Dysphagia 3 (Mech soft) solids;Nectar thick liquid   Liquid Administration via: Cup;No straw   Medication Administration: Whole meds with puree   Supervision: Patient able to self feed;Intermittent supervision to cue for compensatory strategies   Compensations: Slow rate;Small sips/bites;Lingual sweep for clearance of pocketing   Postural Changes: Remain semi-upright  after after feeds/meals (Comment);Seated upright at 90 degrees   Oral Care Recommendations: Oral care BID;Staff/trained caregiver to provide oral care   Other Recommendations: Clarify dietary restrictions   Thank you,  Genene Churn, [place] 05/09/2018,2:05 PM

## 2018-05-09 NOTE — Progress Notes (Signed)
Physical Therapy Treatment Patient Details Name: [name] MRN: [id number] DOB: Dec 08, 1927 Today's Date: 05/09/2018    History of Present Illness [name] is a 84 y.o. female with medical history significant of allergic rhinitis, arthritis, colon polyp, coronary artery disease, GERD, history of blood transfusion, hyperlipidemia, hypertension, nonalcoholic hepatosteatosis, TIA, type 2 diabetes mellitus.,  Per patient she has been having symptoms since Saturday.  However, her husband reported her symptoms started yesterday.  In any case, the symptoms that are reported are left facial droop and left-sided extremities weakness.  She stated that she has had a headache for several days. MRI positive for acute CVA    PT Comments    Pt reports increased flaccidity in LUE this date as well as increased weakness in LLE, which is evident in session. No trace movement/activtion detected in LUE this date, hence RW is no longer an appropriate/safe/useful tool for AMB at this time. Pt demonstrates surprisingly good seated trunk strength overall, and tolerates 8x STS transfers from EOB with physical assist, but LLE eventually becomes weak and knee buckling becomes a concern. Overall patient is progressing well from a trunk stability and sitting balance perspective, but ability to AMB this date is worse d/t worsening of Lt hemiplegia.    Follow Up Recommendations  SNF     Equipment Recommendations  None recommended by PT    Recommendations for Other Services       Precautions / Restrictions Precautions Precautions: Fall Restrictions Weight Bearing Restrictions: No    Mobility  Bed Mobility Overal bed mobility: Needs Assistance Bed Mobility: Supine to Sit       Sit to supine: Min assist      Transfers Overall transfer level: Needs assistance Equipment used: Rolling walker (2 wheeled)(RUE use only) Transfers: Sit to/from [facility] Sit to Stand: Mod assist;From  elevated surface Stand pivot transfers: Max assist;+2 safety/equipment;From elevated surface       General transfer comment: Not able to functionally use LUE this date d/t flaccidity  Ambulation/Gait Ambulation/Gait assistance: (unable this date d/t progression of hemiplegia )               Stairs             Wheelchair Mobility    Modified Rankin (Stroke Patients Only)       Balance Overall balance assessment: Needs assistance Sitting-balance support: Feet supported;No upper extremity supported Sitting balance-Leahy Scale: Good     Standing balance support: During functional activity;Single extremity supported Standing balance-Leahy Scale: Poor Standing balance comment: using RW                            Cognition Arousal/Alertness: Awake/alert Behavior During Therapy: WFL for tasks assessed/performed Overall Cognitive Status: Within Functional Limits for tasks assessed                                        Exercises Other Exercises Other Exercises: P/ROM of elbow Flexion/Extension, Shoulder IR/ER, and Shoulder flexion (all x15) Other Exercises: Heel slides,, Hip Abdct/Add, supine minisquat, SAQ, LAQ: All LLE 2x15 Other Exercises: Seated EOB lateral rocking 1x10 bilat, and sagittal plane rocking 1x10    General Comments        Pertinent Vitals/Pain Pain Assessment: No/denies pain    Home Living  Prior Function            PT Goals (current goals can now be found in the care plan section) Acute Rehab PT Goals Patient Stated Goal: return home after rehab PT Goal Formulation: With patient/family Time For Goal Achievement: 05/21/18 Potential to Achieve Goals: Good Progress towards PT goals: PT to reassess next treatment    Frequency    7X/week      PT Plan Current plan remains appropriate    Co-evaluation              AM-PAC PT "6 Clicks" Mobility   Outcome Measure   Help needed turning from your back to your side while in a flat bed without using bedrails?: Total Help needed moving from lying on your back to sitting on the side of a flat bed without using bedrails?: Total Help needed moving to and from a bed to a chair (including a wheelchair)?: Total Help needed standing up from a chair using your arms (e.g., wheelchair or bedside chair)?: A Lot Help needed to walk in hospital room?: Total Help needed climbing 3-5 steps with a railing? : Total 6 Click Score: 7    End of Session   Activity Tolerance: Patient tolerated treatment well;Patient limited by fatigue Patient left: in chair;with call bell/phone within reach;with chair alarm set;with family/visitor present;with nursing/sitter in room Nurse Communication: Mobility status PT Visit Diagnosis: Unsteadiness on feet (R26.81);Other abnormalities of gait and mobility (R26.89);Muscle weakness (generalized) (M62.81)     Time: 0301-4996 PT Time Calculation (min) (ACUTE ONLY): 27 min  Charges:  $Therapeutic Exercise: 8-22 mins $Therapeutic Activity: 8-22 mins                     12:28 PM, 05/09/18 Etta Grandchild, PT, DPT Physical Therapist - [place] [phone] (Office)    , C 05/09/2018, 12:25 PM

## 2018-05-09 NOTE — Clinical Social Work Note (Signed)
LCSW following. Met with pt and her husband this AM to update on SNF bed offers. Pt states she would like to go to [facility] as it is quite close to her home. Updated Gerald Stabs at [facility]. Per MD, pt will likely be stable for dc tomorrow.

## 2018-05-09 NOTE — Progress Notes (Signed)
PROGRESS NOTE    [name]  SFK:[id number]  DOB: 03-07-1928  DOA: 05/07/2018 PCP: Loman Brooklyn, FNP   Brief Admission Hx: 84 y.o. female with medical history significant of allergic rhinitis, arthritis, colon polyp, coronary artery disease, GERD, history of blood transfusion, hyperlipidemia, hypertension, nonalcoholic hepatosteatosis, TIA, type 2 diabetes mellitus admitted with acute CVA.   MDM/Assessment & Plan:   1. Acute CVA -appreciate neurology consultation.  The patient is going to be treated with dual antiplatelet therapy.  The patient has been taken off warfarin.  When the INR is normalized we do plan to start the patient on aspirin.  The patient is on Plavix at this time.  The patient has had an echocardiogram with normal ejection fraction and grade 1 diastolic dysfunction reported.  PT has evaluated the patient and recommended SNF placement.  A 30-day event monitor has been requested and will be placed by the heartcare team. 2. 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  Her oral medications have been held while she is in the hospital.  She is being treated with a supplemental sliding scale insulin with frequent CBG testing. 3. Essential hypertension-we are allowing for some permissive hypertension in the setting of acute CVA with plans to normalize blood pressure over the next days. Resume home metoprolol today.  4. GERD-resume Protonix. 5. Nonalcoholic hepato-steatosis- resume Actigall. 6. Diabetic dyslipidemia-continue home Crestor.  DVT prophylaxis: SCDs Code Status: FULL  Family Communication: husband updated  Disposition Plan: SNF when bed available  Echocardiogram  Study Conclusions - Left ventricle: The cavity size was normal. Wall thickness was   increased in a pattern of mild LVH. Systolic function  was normal.   The estimated ejection fraction was in the range of 60% to 65%.   Wall motion was normal; there were no regional wall motion   abnormalities. Doppler parameters are consistent with abnormal   left ventricular relaxation (grade 1 diastolic dysfunction). - Aortic valve: Moderately calcified annulus. Probably trileaflet;   moderately calcified leaflets. There was moderate stenosis. Mean   gradient (S): 11 mm Hg. Peak gradient (S): 21 mm Hg. VTI ratio of   LVOT to aortic valve: 0.44. Valve area (VTI): 1.24 cm^2. Valve   area (Vmax): 1.23 cm^2. - Mitral valve: Mildly to moderately calcified annulus. Mildly   thickened leaflets . There was trivial regurgitation. - Right atrium: Central venous pressure (est): 3 mm Hg. - Atrial septum: No defect or patent foramen ovale was identified. - Tricuspid valve: There was mild regurgitation. - Pulmonary arteries: PA peak pressure: 21 mm Hg (S). - Pericardium, extracardiac: A prominent pericardial fat pad was   present.  Consultants:  Neurology   Procedures:    Antimicrobials:     Subjective: Pt says that weakness in left upper extremity basically unchanged and not improved.   Objective: Vitals:   05/08/18 1832 05/08/18 2232 05/09/18 0233 05/09/18 0635  BP: (!) 173/75 (!) 157/65 (!) 116/53 (!) 161/70  Pulse: 98 100 85 96  Resp:   19   Temp: 97.8 F (36.6 C) 98 F (36.7 C) 98.2 F (36.8 C) (!) 97.5 F (36.4 C)  TempSrc: Oral Oral Oral Oral  SpO2: 94% 94% 93% 94%  Weight:      Height:        Intake/Output Summary (Last 24 hours) at 05/09/2018 1046 Last data filed at 05/09/2018  0900 Gross per 24 hour  Intake 240 ml  Output -  Net 240 ml   Filed Weights   05/07/18 0136 05/07/18 0455  Weight: 72.6 kg 73.1 kg    REVIEW OF SYSTEMS  As per history otherwise all reviewed and reported negative  Exam:  General exam: awake, alert, NAD, eating breakfast and cooperative.  Respiratory system: Clear. No increased work  of breathing. Cardiovascular system: S1 & S2 heard, RRR. No JVD, murmurs, gallops, clicks or pedal edema. Gastrointestinal system: Abdomen is nondistended, soft and nontender. Normal bowel sounds heard. Central nervous system: Alert and oriented. Left hemiparesis unchanged.  Extremities: no cyanosis or clubbing.  Data Reviewed: Basic Metabolic Panel: Recent Labs  Lab 05/07/18 0258 05/07/18 0310 05/07/18 0347 05/08/18 0517  NA 141 142  --  140  K 4.0 3.8  --  3.6  CL 105 104  --  107  CO2 27  --   --  25  GLUCOSE 156* 152*  --  145*  BUN 11 11  --  14  CREATININE 0.58 0.50  --  0.69  CALCIUM 9.8  --   --  9.6  MG  --   --  1.6* 2.0  PHOS  --   --  3.1  --    Liver Function Tests: Recent Labs  Lab 05/07/18 0258 05/08/18 0517  AST 18 16  ALT 18 16  ALKPHOS 113 108  BILITOT 0.4 0.4  PROT 8.1 7.0  ALBUMIN 4.4 3.9   No results for input(s): LIPASE, AMYLASE in the last 168 hours. No results for input(s): AMMONIA in the last 168 hours. CBC: Recent Labs  Lab 05/07/18 0258 05/07/18 0310 05/08/18 0517  WBC 8.7  --  7.9  NEUTROABS 6.2  --   --   HGB 14.1 14.6 13.0  HCT 43.7 43.0 40.8  MCV 93.4  --  93.8  PLT 213  --  213   Cardiac Enzymes: No results for input(s): CKTOTAL, CKMB, CKMBINDEX, TROPONINI in the last 168 hours. CBG (last 3)  Recent Labs    05/08/18 1634 05/08/18 2120 05/09/18 0729  GLUCAP 171* 151* 128*   Recent Results (from the past 240 hour(s))  MRSA PCR Screening     Status: None   Collection Time: 05/07/18  4:40 AM  Result Value Ref Range Status   MRSA by PCR NEGATIVE NEGATIVE Final    Comment:        The GeneXpert MRSA Assay (FDA approved for NASAL specimens only), is one component of a comprehensive MRSA colonization surveillance program. It is not intended to diagnose MRSA infection nor to guide or monitor treatment for MRSA infections. Performed at [facility], [address] [place], [place] [address]     Studies: US Venous  Img Lower Bilateral  Result Date: 05/08/2018 CLINICAL DATA:  Bilateral edema, recent injury, altered mental status EXAM: BILATERAL LOWER EXTREMITY VENOUS DOPPLER ULTRASOUND TECHNIQUE: Gray-scale sonography with graded compression, as well as color Doppler and duplex ultrasound were performed to evaluate the lower extremity deep venous systems from the level of the common femoral vein and including the common femoral, femoral, profunda femoral, popliteal and calf veins including the posterior tibial, peroneal and gastrocnemius veins when visible. The superficial great saphenous vein was also interrogated. Spectral Doppler was utilized to evaluate flow at rest and with distal augmentation maneuvers in the common femoral, femoral and popliteal veins. COMPARISON:  None. FINDINGS: RIGHT LOWER EXTREMITY Common Femoral Vein: No evidence of thrombus. Normal compressibility,  respiratory phasicity and response to augmentation. Saphenofemoral Junction: No evidence of thrombus. Normal compressibility and flow on color Doppler imaging. Profunda Femoral Vein: No evidence of thrombus. Normal compressibility and flow on color Doppler imaging. Femoral Vein: No evidence of thrombus. Normal compressibility, respiratory phasicity and response to augmentation. Popliteal Vein: No evidence of thrombus. Normal compressibility, respiratory phasicity and response to augmentation. Calf Veins: No evidence of thrombus. Normal compressibility and flow on color Doppler imaging. Superficial Great Saphenous Vein: No evidence of thrombus. Normal compressibility. Venous Reflux:  None. Other Findings:  None. LEFT LOWER EXTREMITY Common Femoral Vein: No evidence of thrombus. Normal compressibility, respiratory phasicity and response to augmentation. Saphenofemoral Junction: No evidence of thrombus. Normal compressibility and flow on color Doppler imaging. Profunda Femoral Vein: No evidence of thrombus. Normal compressibility and flow on color Doppler  imaging. Femoral Vein: No evidence of thrombus. Normal compressibility, respiratory phasicity and response to augmentation. Popliteal Vein: No evidence of thrombus. Normal compressibility, respiratory phasicity and response to augmentation. Calf Veins: No evidence of thrombus. Normal compressibility and flow on color Doppler imaging. Superficial Great Saphenous Vein: No evidence of thrombus. Normal compressibility. Venous Reflux:  None. Other Findings:  None. IMPRESSION: No evidence of deep venous thrombosis. Electronically Signed   By: Jerilynn Mages.  Shick M.D.   On: 05/08/2018 14:23   Scheduled Meds: . clopidogrel  75 mg Oral Q breakfast  . fluticasone  2 spray Each Nare Daily  . insulin aspart  0-9 Units Subcutaneous TID WC  . loratadine  10 mg Oral Daily  . metoprolol succinate  25 mg Oral Daily  . multivitamin with minerals  1 tablet Oral Daily  . pantoprazole  40 mg Oral Daily  . rosuvastatin  5 mg Oral Weekly  . ursodiol  300 mg Oral BID   Continuous Infusions:  Principal Problem:   Acute CVA (cerebrovascular accident) ([place]) Active Problems:   Type II diabetes mellitus ([place])   HYPERCHOLESTEROLEMIA   Essential hypertension   Coronary atherosclerosis   GERD (gastroesophageal reflux disease)   Acute left-sided weakness   Nonalcoholic hepatosteatosis   Weakness  Time spent:   Irwin Brakeman, MD, FAAFP [facility] Pager [phone]  If 7PM-7AM, please contact night-coverage [email or web address] Password [email or web address] 05/09/2018, 10:46 AM    LOS: 2 days

## 2018-05-10 LAB — GLUCOSE, CAPILLARY
Glucose-Capillary: 147 mg/dL — ABNORMAL HIGH (ref 70–99)
Glucose-Capillary: 229 mg/dL — ABNORMAL HIGH (ref 70–99)

## 2018-05-10 LAB — PROTIME-INR
INR: 1.57
PROTHROMBIN TIME: 18.6 s — AB (ref 11.4–15.2)

## 2018-05-10 MED ORDER — ROSUVASTATIN CALCIUM 10 MG PO TABS: 5.0000 mg | ORAL_TABLET | Freq: Every day | ORAL | Status: DC

## 2018-05-10 MED ORDER — LOSARTAN POTASSIUM 25 MG PO TABS: 25.0000 mg | ORAL_TABLET | Freq: Every day | ORAL | Status: DC

## 2018-05-10 MED ORDER — ASPIRIN EC 81 MG PO TBEC
81.0000 mg | DELAYED_RELEASE_TABLET | Freq: Every day | ORAL | Status: DC
  Administered 2018-05-10: 81 mg via ORAL
  Filled 2018-05-10: qty 1

## 2018-05-10 MED ORDER — ROSUVASTATIN CALCIUM 5 MG PO TABS: 5.0000 mg | ORAL_TABLET | Freq: Every day | ORAL | 3 refills | Status: DC

## 2018-05-10 MED ORDER — ASPIRIN 81 MG PO TBEC: 81.0000 mg | DELAYED_RELEASE_TABLET | Freq: Every day | ORAL | Status: DC

## 2018-05-10 MED ORDER — LOSARTAN POTASSIUM 50 MG PO TABS
25.0000 mg | ORAL_TABLET | Freq: Every day | ORAL | Status: DC
  Administered 2018-05-10: 25 mg via ORAL
  Filled 2018-05-10: qty 1

## 2018-05-10 MED ORDER — METFORMIN HCL ER 500 MG PO TB24: 500.0000 mg | ORAL_TABLET | Freq: Two times a day (BID) | ORAL | Status: AC

## 2018-05-10 NOTE — Progress Notes (Signed)
Patient discharged to [facility] called,and given to [facility]. Vital signs stable.No c/o pain or discomfort noted. EMS to transport patient to awaiting facility. Family at bedside.

## 2018-05-10 NOTE — Progress Notes (Signed)
Occupational Therapy Treatment Patient Details Name: [name] MRN: [id number] DOB: 1927/11/03 Today's Date: 05/10/2018    History of present illness [name] is a 84 y.o. female with medical history significant of allergic rhinitis, arthritis, colon polyp, coronary artery disease, GERD, history of blood transfusion, hyperlipidemia, hypertension, nonalcoholic hepatosteatosis, TIA, type 2 diabetes mellitus.,  Per patient she has been having symptoms since Saturday.  However, her husband reported her symptoms started yesterday.  In any case, the symptoms that are reported are left facial droop and left-sided extremities weakness.  She stated that she has had a headache for several days. MRI positive for acute CVA   OT comments  Pt with increased flaccidity of LUE compared to previous treatment on 12/4. Pt with min activation of trapezius and able to loosely curl fingers. Pt requiring increased assistance with ADLs due to increased weakness in LUE/LLE. Able to perform toilet transfer to [facility] with mod assist, however fatigued after toileting and requiring max assist to transfer to chair. Verbal cuing for safety and sequencing as pt tends to attempt transfers too quickly rather than waiting on instructions. Discharge to SNF remains appropriate. Will continue to follow while in acute care, goals downgraded as appropriate for current level of functioning.    Follow Up Recommendations  SNF    Equipment Recommendations  None recommended by OT       Precautions / Restrictions Precautions Precautions: Fall Restrictions Weight Bearing Restrictions: No       Mobility Bed Mobility Overal bed mobility: Needs Assistance Bed Mobility: Supine to Sit       Sit to supine: Min assist      Transfers Overall transfer level: Needs assistance Equipment used: None Transfers: Sit to/from [facility] Sit to Stand: Mod assist;From elevated surface Stand pivot transfers: Max  assist;From elevated surface       General transfer comment: Unable to use LUE as assist today, requiring verbal cuing for sequencing-rocking foward to stand and pivot on count of 3. Cuing for hand and foot placement        ADL either performed or assessed with clinical judgement   ADL Overall ADL's : Needs assistance/impaired Eating/Feeding: Set up;Sitting Eating/Feeding Details (indicate cue type and reason): Pt unable to use LUE to assist. Pt requiring food to be cut and packages opened                     Toilet Transfer: Moderate assistance;Squat-pivot;BSC Toilet Transfer Details (indicate cue type and reason): Unable to use RW as pt is unable to hold with LUE at this time Toileting- Water quality scientist and Hygiene: Min guard;Sitting/lateral lean Toileting - Clothing Manipulation Details (indicate cue type and reason): Pt able to perform hygiene tasks via leaning to the left, OT positioned in front of pt for safety       General ADL Comments: Pt requiring increased assistance for ADLs as she is now no longer able to use LUE for assist     Vision   Vision Assessment?: Yes Eye Alignment: Within Functional Limits Ocular Range of Motion: Within Functional Limits Alignment/Gaze Preference: Within Defined Limits Tracking/Visual Pursuits: Able to track stimulus in all quads without difficulty Saccades: Within functional limits Convergence: Within functional limits Visual Fields: Left homonymous hemianopsia          Cognition Arousal/Alertness: Awake/alert Behavior During Therapy: WFL for tasks assessed/performed Overall Cognitive Status: Within Functional Limits for tasks assessed  Exercises Exercises: General Upper Extremity;Other exercises General Exercises - Upper Extremity Shoulder Flexion: PROM;10 reps Shoulder Extension: PROM;10 reps Shoulder ABduction: PROM;10 reps Shoulder ADduction: PROM;10  reps Elbow Flexion: PROM;10 reps Elbow Extension: PROM;10 reps Wrist Flexion: PROM;5 reps Wrist Extension: PROM;5 reps Other Exercises Other Exercises: weightbearing on LUE-pt leaning onto elbow for weightbearing, 2x, 1' holds. Pt able to push into elbow and up into sitting with min/mod assist            Pertinent Vitals/ Pain       Pain Assessment: 0-10 Pain Score: 3  Pain Location: left shoulder Pain Descriptors / Indicators: Sore Pain Intervention(s): Limited activity within patient's tolerance;Monitored during session;Repositioned         Frequency  Min 2X/week        Progress Toward Goals  OT Goals(current goals can now be found in the care plan section)  Progress towards OT goals: Goals drowngraded-see care plan  Acute Rehab OT Goals Patient Stated Goal: return home after rehab OT Goal Formulation: With patient Time For Goal Achievement: 05/21/18 Potential to Achieve Goals: Good ADL Goals Pt Will Perform Eating: with set-up;sitting Pt Will Perform Grooming: with set-up;sitting Pt Will Perform Upper Body Dressing: with min assist;sitting Pt Will Perform Lower Body Dressing: with min assist;sitting/lateral leans Pt Will Transfer to Toilet: with min assist;stand pivot transfer;ambulating;regular height toilet;bedside commode Pt Will Perform Toileting - Clothing Manipulation and hygiene: with supervision;sitting/lateral leans Pt/caregiver will Perform Home Exercise Program: Increased strength;Left upper extremity;Independently;With written HEP provided  Plan Discharge plan remains appropriate          End of Session Equipment Utilized During Treatment: Gait belt  OT Visit Diagnosis: Muscle weakness (generalized) (M62.81);Hemiplegia and hemiparesis Hemiplegia - Right/Left: Left Hemiplegia - dominant/non-dominant: Non-Dominant Hemiplegia - caused by: Cerebral infarction   Activity Tolerance Patient tolerated treatment well   Patient Left in chair;with call  bell/phone within reach;with bed alarm set;with family/visitor present   Nurse Communication          Time: 4356-8616 OT Time Calculation (min): 30 min  Charges: OT General Charges $OT Visit: 1 Visit OT Treatments $Self Care/Home Management : 23-37 mins   Guadelupe Sabin, OTR/L  [phone] 05/10/2018, 8:41 AM

## 2018-05-10 NOTE — Plan of Care (Signed)
Care plan update:  Problem: Acute Rehab OT Goals (only OT should resolve) Goal: Pt. Will Perform Eating Flowsheets (Taken 05/10/2018 0844) Pt Will Perform Eating: with set-up; sitting Goal: Pt. Will Perform Grooming Flowsheets (Taken 05/10/2018 0844) Pt Will Perform Grooming: with set-up; sitting Goal: Pt. Will Perform Upper Body Dressing Flowsheets (Taken 05/10/2018 0844) Pt Will Perform Upper Body Dressing: with min assist; sitting Goal: Pt. Will Perform Lower Body Dressing Flowsheets (Taken 05/10/2018 0844) Pt Will Perform Lower Body Dressing: with min assist; sitting/lateral leans Goal: Pt. Will Transfer To Toilet Flowsheets (Taken 05/10/2018 0844) Pt Will Transfer to Toilet: with min assist; stand pivot transfer; ambulating; regular height toilet; bedside commode Goal: Pt. Will Perform Toileting-Clothing Manipulation Flowsheets (Taken 05/10/2018 0844) Pt Will Perform Toileting - Clothing Manipulation and hygiene: with supervision; sitting/lateral leans Goal: Pt/Caregiver Will Perform Home Exercise Program Flowsheets (Taken 05/10/2018 0800) Pt/caregiver will Perform Home Exercise Program: Increased strength;Left upper extremity;Independently;With written HEP provided

## 2018-05-10 NOTE — Discharge Summary (Addendum)
Physician Discharge Summary  [name] BHA:[id number] DOB: Nov 01, 1927 DOA: 05/07/2018  PCP: Loman Brooklyn, FNP Neurologist: Dr. Merlene Laughter  Admit date: 05/07/2018 Discharge date: 05/10/2018  Admitted From: Home  Disposition:  SNF   Recommendations for Outpatient Follow-up:  1. Follow up with neurologist in 1 month 2. Follow up with cardiologist as scheduled.  3. Please wear 30 day event monitor arranged while in hospital  Discharge Condition: STABLE   CODE STATUS: FULL    DIET RECOMMENDATIONS FROM SPEECH THERAPY: SLP Diet Recommendations: Dysphagia 3 (Mech soft) solids;Nectar thick liquid   Liquid Administration via: Cup;No straw   Medication Administration: Whole meds with puree   Supervision: Patient able to self feed;Intermittent supervision to cue for compensatory strategies   Compensations: Slow rate;Small sips/bites;Lingual sweep for clearance of pocketing   Postural Changes: Remain semi-upright after after feeds/meals (Comment);Seated upright at 90 degrees   Brief Hospitalization Summary: Please see all hospital notes, images, labs for full details of the hospitalization. HPI: [name] is a 84 y.o. female with medical history significant of allergic rhinitis, arthritis, colon polyp, coronary artery disease, GERD, history of blood transfusion, hyperlipidemia, hypertension, nonalcoholic hepatosteatosis, TIA, type 2 diabetes mellitus.,  Per patient she has been having symptoms since Saturday.  However, her husband reported her symptoms started yesterday.  In any case, the symptoms that are reported are left facial droop and left-sided extremities weakness.  She stated that she has had a headache for several days.  Denies vision changes or slurred speech.  No appetite or sleep changes, fever, chills, sore throat, dyspnea, chest pain, palpitations, dizziness, diaphoresis, dyspnea, PND, orthopnea or recent pitting edema of the lower extremities.  No abdominal pain,  nausea, emesis, diarrhea, constipation, melena or hematochezia.  Denies dysuria, frequency or hematuria.  She denies anxious or depressive mood.  ED Course: BP (!) 151/70  Pulse 78  Temp 98.7 F (37.1 C) (Oral)  Resp 18  Ht 1.626 m (5' 4" )  Wt 72.6 kg  SpO2 93%  BMI 27.46 kg/m  Her CBC was normal.  Urinalysis was unremarkable.  UDS was normal.  I-STAT troponin was 0.00 ng/mL.  Alcohol level was normal.  PT was 23.8 and PTT 38 seconds with an INR of 2.16.  CMP shows a glucose of 156 mg/dL, all other values are within normal limits.  Phosphorus was normal.  Magnesium was low at 1.6 mg/dL.   Imaging: CT head did not show any acute intracranial hemorrhage or any other acute pathology.  There was age-related atrophy and chronic microvascular ischemic changes.  Please images and full radiology report for further detail.  Brief Admission Hx: 84 y.o.femalewith medical history significant ofallergic rhinitis, arthritis, colon polyp, coronary artery disease, GERD, history of blood transfusion, hyperlipidemia, hypertension, nonalcoholic hepatosteatosis, TIA, type 2 diabetes mellitus admitted with acute CVA.   MDM/Assessment & Plan:   1. Acute CVA -appreciate neurology consultation.  The patient is going to be treated with dual antiplatelet therapy.  The patient has been taken off warfarin by neurologist.  Pt started on aspirin and plavix.   The patient has had an echocardiogram with normal ejection fraction and grade 1 diastolic dysfunction reported.  PT has evaluated the patient and recommended SNF placement.  A 30-day event monitor has been requested and placed by the heartcare team. 2. 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  Glipizide discontinued, increased metformin ER to 500 mg BID.   3. Essential  hypertension-we are allowing for some permissive hypertension in the setting of acute CVA with  plans to normalize blood pressure over the week. Resume home metoprolol and lower dose losartan.   4. GERD-resumed Protonix. 5. Dysphagia - Pt was seen by speech therapy and they recommend Dys 3 diet, nectar thick liquid.  6. Nonalcoholic hepato-steatosis- resume Actigall. 7. Diabetic dyslipidemia-LDL was 181 which is suboptimally controlled on once weekly crestor.  I increased crestor to once daily at HS to get LDL down. Pt reported intolerace to statins given myalgias but given severity of her cerebrovascular disease I think we should push crestor 5 mg daily.   Follow up with cardiology has been arranged.    DVT prophylaxis: SCDs Code Status: FULL  Family Communication: husband and daughter updated  Disposition Plan: SNF  Echocardiogram  Study Conclusions - Left ventricle: The cavity size was normal. Wall thickness was increased in a pattern of mild LVH. Systolic function was normal. The estimated ejection fraction was in the range of 60% to 65%. Wall motion was normal; there were no regional wall motion abnormalities. Doppler parameters are consistent with abnormal left ventricular relaxation (grade 1 diastolic dysfunction). - Aortic valve: Moderately calcified annulus. Probably trileaflet; moderately calcified leaflets. There was moderate stenosis. Mean gradient (S): 11 mm Hg. Peak gradient (S): 21 mm Hg. VTI ratio of LVOT to aortic valve: 0.44. Valve area (VTI): 1.24 cm^2. Valve area (Vmax): 1.23 cm^2. - Mitral valve: Mildly to moderately calcified annulus. Mildly thickened leaflets . There was trivial regurgitation. - Right atrium: Central venous pressure (est): 3 mm Hg. - Atrial septum: No defect or patent foramen ovale was identified. - Tricuspid valve: There was mild regurgitation. - Pulmonary arteries: PA peak pressure: 21 mm Hg (S). - Pericardium, extracardiac: A prominent pericardial fat pad was present.  Consultants:  Neurology    Procedures:    Antimicrobials:      Discharge Diagnoses:  Principal Problem:   Acute CVA (cerebrovascular accident) ([place]) Active Problems:   Type II diabetes mellitus ([place])   HYPERCHOLESTEROLEMIA   Essential hypertension   Coronary atherosclerosis   GERD (gastroesophageal reflux disease)   Acute left-sided weakness   Nonalcoholic hepatosteatosis   Weakness  Discharge Instructions: Discharge Instructions    Increase activity slowly   Complete by:  As directed      Allergies as of 05/10/2018      Reactions   Ivp Dye [iodinated Diagnostic Agents] Anaphylaxis   Sulfonamide Derivatives Shortness Of Breath   Lipitor [atorvastatin Calcium]    Muscle Aches   Zetia [ezetimibe]    Muscle Aches   Penicillins Rash   As a teenager. Has patient had a PCN reaction causing immediate rash, facial/tongue/throat swelling, SOB or lightheadedness with hypotension: No Has patient had a PCN reaction causing severe rash involving mucus membranes or skin necrosis: No Has patient had a PCN reaction that required hospitalization: No Has patient had a PCN reaction occurring within the last 10 years: No If all of the above answers are "NO", then may proceed with Cephalosporin use.      Medication List    STOP taking these medications   amLODipine 10 MG tablet Commonly known as:  NORVASC   glipiZIDE 2.5 MG 24 hr tablet Commonly known as:  GLUCOTROL XL   warfarin 5 MG tablet Commonly known as:  COUMADIN     TAKE these medications   aspirin 81 MG EC tablet Take 1 tablet (81 mg total) by mouth daily. Start taking on:  05/11/2018  clopidogrel 75 MG tablet Commonly known as:  PLAVIX Take 1 tablet (75 mg total) by mouth daily with breakfast.   docusate sodium 100 MG capsule Commonly known as:  COLACE Take 200 mg by mouth 2 (two) times daily as needed for mild constipation.   fexofenadine 180 MG tablet Commonly known as:  ALLEGRA Take 180 mg by mouth daily.   fluticasone  50 MCG/ACT nasal spray Commonly known as:  FLONASE Place 2 sprays into both nostrils daily.   losartan 25 MG tablet Commonly known as:  COZAAR Take 1 tablet (25 mg total) by mouth daily. What changed:    medication strength  how much to take   metFORMIN 500 MG 24 hr tablet Commonly known as:  GLUCOPHAGE-XR Take 1 tablet (500 mg total) by mouth 2 (two) times daily with a meal. What changed:  when to take this   metoprolol succinate 25 MG 24 hr tablet Commonly known as:  TOPROL-XL Take 1 tablet (25 mg total) by mouth daily.   multivitamin tablet Take 1 tablet by mouth daily.   nitroGLYCERIN 0.4 MG SL tablet Commonly known as:  NITROSTAT Place 0.4 mg under the tongue every 5 (five) minutes as needed for chest pain.   pantoprazole 40 MG tablet Commonly known as:  PROTONIX Take 1 tablet (40 mg total) by mouth daily.   REFRESH 1 % ophthalmic solution Generic drug:  carboxymethylcellulose Place 1 drop into both eyes 2 (two) times daily as needed (dry eyes).   rosuvastatin 5 MG tablet Commonly known as:  CRESTOR Take 1 tablet (5 mg total) by mouth daily at 6 PM. What changed:  when to take this   ursodiol 300 MG capsule Commonly known as:  ACTIGALL Take 300 mg by mouth 2 (two) times daily.   vitamin E 400 UNIT capsule Generic drug:  vitamin E Take 400 Units by mouth 2 (two) times daily.       Contact information for follow-up providers    Phillips Odor, MD. Schedule an appointment as soon as possible for a visit in 1 month(s).   Specialty:  Neurology Why:  Hospital Follow Up for stroke Contact information: [address] [place] [address] [phone]            Contact information for after-discharge care    [facility] .   Service:  Skilled Nursing Contact information: [address] [address] [phone]                 Allergies  Allergen Reactions  . Ivp Dye  [Iodinated Diagnostic Agents] Anaphylaxis  . Sulfonamide Derivatives Shortness Of Breath  . Lipitor [Atorvastatin Calcium]     Muscle Aches  . Zetia [Ezetimibe]     Muscle Aches  . Penicillins Rash    As a teenager. Has patient had a PCN reaction causing immediate rash, facial/tongue/throat swelling, SOB or lightheadedness with hypotension: No Has patient had a PCN reaction causing severe rash involving mucus membranes or skin necrosis: No Has patient had a PCN reaction that required hospitalization: No Has patient had a PCN reaction occurring within the last 10 years: No If all of the above answers are "NO", then may proceed with Cephalosporin use.    Allergies as of 05/10/2018      Reactions   Ivp Dye [iodinated Diagnostic Agents] Anaphylaxis   Sulfonamide Derivatives Shortness Of Breath   Lipitor [atorvastatin Calcium]    Muscle Aches  Zetia [ezetimibe]    Muscle Aches   Penicillins Rash   As a teenager. Has patient had a PCN reaction causing immediate rash, facial/tongue/throat swelling, SOB or lightheadedness with hypotension: No Has patient had a PCN reaction causing severe rash involving mucus membranes or skin necrosis: No Has patient had a PCN reaction that required hospitalization: No Has patient had a PCN reaction occurring within the last 10 years: No If all of the above answers are "NO", then may proceed with Cephalosporin use.      Medication List    STOP taking these medications   amLODipine 10 MG tablet Commonly known as:  NORVASC   glipiZIDE 2.5 MG 24 hr tablet Commonly known as:  GLUCOTROL XL   warfarin 5 MG tablet Commonly known as:  COUMADIN     TAKE these medications   aspirin 81 MG EC tablet Take 1 tablet (81 mg total) by mouth daily. Start taking on:  05/11/2018   clopidogrel 75 MG tablet Commonly known as:  PLAVIX Take 1 tablet (75 mg total) by mouth daily with breakfast.   docusate sodium 100 MG capsule Commonly known as:  COLACE Take  200 mg by mouth 2 (two) times daily as needed for mild constipation.   fexofenadine 180 MG tablet Commonly known as:  ALLEGRA Take 180 mg by mouth daily.   fluticasone 50 MCG/ACT nasal spray Commonly known as:  FLONASE Place 2 sprays into both nostrils daily.   losartan 25 MG tablet Commonly known as:  COZAAR Take 1 tablet (25 mg total) by mouth daily. What changed:    medication strength  how much to take   metFORMIN 500 MG 24 hr tablet Commonly known as:  GLUCOPHAGE-XR Take 1 tablet (500 mg total) by mouth 2 (two) times daily with a meal. What changed:  when to take this   metoprolol succinate 25 MG 24 hr tablet Commonly known as:  TOPROL-XL Take 1 tablet (25 mg total) by mouth daily.   multivitamin tablet Take 1 tablet by mouth daily.   nitroGLYCERIN 0.4 MG SL tablet Commonly known as:  NITROSTAT Place 0.4 mg under the tongue every 5 (five) minutes as needed for chest pain.   pantoprazole 40 MG tablet Commonly known as:  PROTONIX Take 1 tablet (40 mg total) by mouth daily.   REFRESH 1 % ophthalmic solution Generic drug:  carboxymethylcellulose Place 1 drop into both eyes 2 (two) times daily as needed (dry eyes).   rosuvastatin 5 MG tablet Commonly known as:  CRESTOR Take 1 tablet (5 mg total) by mouth daily at 6 PM. What changed:  when to take this   ursodiol 300 MG capsule Commonly known as:  ACTIGALL Take 300 mg by mouth 2 (two) times daily.   vitamin E 400 UNIT capsule Generic drug:  vitamin E Take 400 Units by mouth 2 (two) times daily.       Procedures/Studies: Ct Head Wo Contrast  Result Date: 05/07/2018 CLINICAL DATA:  84 year old female with abnormal gait and left facial droop. EXAM: CT HEAD WITHOUT CONTRAST TECHNIQUE: Contiguous axial images were obtained from the base of the skull through the vertex without intravenous contrast. COMPARISON:  Head CT dated 04/02/2013 FINDINGS: Brain: There is mild age-related atrophy. Moderate chronic  microvascular ischemic changes noted. There is no acute intracranial hemorrhage. No mass effect or midline shift. No extra-axial fluid collection. Vascular: No hyperdense vessel or unexpected calcification. Skull: Normal. Negative for fracture or focal lesion. Sinuses/Orbits: Small bilateral maxillary sinus retention cysts  or polyps. No air-fluid levels. The mastoid air cells are clear. Other: None IMPRESSION: 1. No acute intracranial hemorrhage. 2. Age-related atrophy and chronic microvascular ischemic changes. Electronically Signed   By: Anner Crete M.D.   On: 05/07/2018 02:36   Mr Jodene Nam Head Wo Contrast  Result Date: 05/07/2018 CLINICAL DATA:  Acute headache with normal neuro exam. Left-sided weakness and left facial droop EXAM: MRI HEAD WITHOUT CONTRAST MRA HEAD WITHOUT CONTRAST TECHNIQUE: Multiplanar, multiecho pulse sequences of the brain and surrounding structures were obtained without intravenous contrast. Angiographic images of the head were obtained using MRA technique without contrast. COMPARISON:  12/14/2016 brain MRI FINDINGS: MRI HEAD FINDINGS Brain: Patchy restricted diffusion in the right occipital lobe, cortical to subcortical. There is a punctate left occipital and right posterior frontal white matter infarcts. Wedge of restricted diffusion in the right pons respecting midline. No hemorrhage, hydrocephalus, or masslike finding. Chronic small vessel ischemia with extensive ischemic gliosis in the cerebral white matter. Remote lacunar infarcts in the bilateral thalami and parietal white matter. Vascular: Arterial findings below. Normal dural venous sinus flow voids. Skull and upper cervical spine: No evidence of marrow lesion Sinuses/Orbits: Bilateral cataract resection MRA HEAD FINDINGS Symmetric carotid and vertebral arteries. Proximal V4 segment symmetric poor flow signal attributed to dephasing artifact. The carotid, vertebral, and basilar arteries are widely patent. High-grade multi for  narrowing of left M1, left A2, and right more than left proximal PCA. Negative for aneurysm. IMPRESSION: Brain MRI: 1. Patchy acute infarct in the right occipital lobe. 2. Acute right pontine infarct. 3. Punctate white matter infarcts in the posterior right frontal and left occipital white matter. 4. Background of advanced chronic small vessel ischemia. Intracranial MRA: Advanced intracranial atherosclerosis with high-grade narrowings at the left M1, left A2, and right P2/3 segments. Electronically Signed   By: Monte Fantasia M.D.   On: 05/07/2018 08:51   Mr Brain Wo Contrast  Result Date: 05/07/2018 CLINICAL DATA:  Acute headache with normal neuro exam. Left-sided weakness and left facial droop EXAM: MRI HEAD WITHOUT CONTRAST MRA HEAD WITHOUT CONTRAST TECHNIQUE: Multiplanar, multiecho pulse sequences of the brain and surrounding structures were obtained without intravenous contrast. Angiographic images of the head were obtained using MRA technique without contrast. COMPARISON:  12/14/2016 brain MRI FINDINGS: MRI HEAD FINDINGS Brain: Patchy restricted diffusion in the right occipital lobe, cortical to subcortical. There is a punctate left occipital and right posterior frontal white matter infarcts. Wedge of restricted diffusion in the right pons respecting midline. No hemorrhage, hydrocephalus, or masslike finding. Chronic small vessel ischemia with extensive ischemic gliosis in the cerebral white matter. Remote lacunar infarcts in the bilateral thalami and parietal white matter. Vascular: Arterial findings below. Normal dural venous sinus flow voids. Skull and upper cervical spine: No evidence of marrow lesion Sinuses/Orbits: Bilateral cataract resection MRA HEAD FINDINGS Symmetric carotid and vertebral arteries. Proximal V4 segment symmetric poor flow signal attributed to dephasing artifact. The carotid, vertebral, and basilar arteries are widely patent. High-grade multi for narrowing of left M1, left A2, and  right more than left proximal PCA. Negative for aneurysm. IMPRESSION: Brain MRI: 1. Patchy acute infarct in the right occipital lobe. 2. Acute right pontine infarct. 3. Punctate white matter infarcts in the posterior right frontal and left occipital white matter. 4. Background of advanced chronic small vessel ischemia. Intracranial MRA: Advanced intracranial atherosclerosis with high-grade narrowings at the left M1, left A2, and right P2/3 segments. Electronically Signed   By: Monte Fantasia M.D.   On:  05/07/2018 08:51   US Venous Img Lower Bilateral  Result Date: 05/08/2018 CLINICAL DATA:  Bilateral edema, recent injury, altered mental status EXAM: BILATERAL LOWER EXTREMITY VENOUS DOPPLER ULTRASOUND TECHNIQUE: Gray-scale sonography with graded compression, as well as color Doppler and duplex ultrasound were performed to evaluate the lower extremity deep venous systems from the level of the common femoral vein and including the common femoral, femoral, profunda femoral, popliteal and calf veins including the posterior tibial, peroneal and gastrocnemius veins when visible. The superficial great saphenous vein was also interrogated. Spectral Doppler was utilized to evaluate flow at rest and with distal augmentation maneuvers in the common femoral, femoral and popliteal veins. COMPARISON:  None. FINDINGS: RIGHT LOWER EXTREMITY Common Femoral Vein: No evidence of thrombus. Normal compressibility, respiratory phasicity and response to augmentation. Saphenofemoral Junction: No evidence of thrombus. Normal compressibility and flow on color Doppler imaging. Profunda Femoral Vein: No evidence of thrombus. Normal compressibility and flow on color Doppler imaging. Femoral Vein: No evidence of thrombus. Normal compressibility, respiratory phasicity and response to augmentation. Popliteal Vein: No evidence of thrombus. Normal compressibility, respiratory phasicity and response to augmentation. Calf Veins: No evidence of  thrombus. Normal compressibility and flow on color Doppler imaging. Superficial Great Saphenous Vein: No evidence of thrombus. Normal compressibility. Venous Reflux:  None. Other Findings:  None. LEFT LOWER EXTREMITY Common Femoral Vein: No evidence of thrombus. Normal compressibility, respiratory phasicity and response to augmentation. Saphenofemoral Junction: No evidence of thrombus. Normal compressibility and flow on color Doppler imaging. Profunda Femoral Vein: No evidence of thrombus. Normal compressibility and flow on color Doppler imaging. Femoral Vein: No evidence of thrombus. Normal compressibility, respiratory phasicity and response to augmentation. Popliteal Vein: No evidence of thrombus. Normal compressibility, respiratory phasicity and response to augmentation. Calf Veins: No evidence of thrombus. Normal compressibility and flow on color Doppler imaging. Superficial Great Saphenous Vein: No evidence of thrombus. Normal compressibility. Venous Reflux:  None. Other Findings:  None. IMPRESSION: No evidence of deep venous thrombosis. Electronically Signed   By: Jerilynn Mages.  Shick M.D.   On: 05/08/2018 14:23   Dg Swallowing Func-speech Pathology  Result Date: 05/09/2018 Objective Swallowing Evaluation: Type of Study: MBS-Modified Barium Swallow Study  Patient Details Name: [name] MRN: [id number] Date of Birth: December 31, 1927 Today's Date: 05/09/2018 Time: SLP Start Time (ACUTE ONLY): 0945 -SLP Stop Time (ACUTE ONLY): 1015 SLP Time Calculation (min) (ACUTE ONLY): 30 min Past Medical History: Past Medical History: Diagnosis Date . Allergic rhinitis  . Arthritis   "probably in my hands" (02/26/2018) . Colonic polyp  . Coronary artery disease   a. s/p cath on 02/26/2018 with 3-vessel CAD with 95% proximal LAD, 90% distal RCA, and 70% OM1 stenosis.  She underwent successful PCI with DES x1 to the distal RCA and DES x1 to the proximal LAD . GERD (gastroesophageal reflux disease)  . History of blood transfusion   "don't  remember why or when" (02/26/2018) . Hyperlipidemia  . Hypertension  . Nonalcoholic hepatosteatosis  . TIA (transient ischemic attack) 2000s  "I've had ~ 3" (02/26/2018) . Type II diabetes mellitus ([place])  Past Surgical History: Past Surgical History: Procedure Laterality Date . ABDOMINAL HYSTERECTOMY   . APPENDECTOMY  1950s . CARDIAC CATHETERIZATION  2001  Nonobstructive CAD . CATARACT EXTRACTION W/ INTRAOCULAR LENS  IMPLANT, BILATERAL Bilateral 2000s . CORONARY STENT INTERVENTION N/A 02/26/2018  Procedure: CORONARY STENT INTERVENTION;  Surgeon: Martinique, Peter M, MD;  Location: [facility];  Service: Cardiovascular;  Laterality: N/A;  rca  . LEFT  HEART CATH AND CORONARY ANGIOGRAPHY N/A 02/26/2018  Procedure: LEFT HEART CATH AND CORONARY ANGIOGRAPHY;  Surgeon: Martinique, Peter M, MD;  Location: [facility];  Service: Cardiovascular;  Laterality: N/A; . TONSILLECTOMY  1943 HPI: [name] is a 84 y.o. female with medical history significant of allergic rhinitis, arthritis, colon polyp, coronary artery disease, GERD, history of blood transfusion, hyperlipidemia, hypertension, nonalcoholic hepatosteatosis, TIA, type 2 diabetes mellitus.,  Per patient she has been having symptoms since Saturday.  However, her husband reported her symptoms started yesterday.  In any case, the symptoms that are reported are left facial droop and left-sided extremities weakness.  She stated that she has had a headache for several days. MRI positive for acute CVA.  Subjective: "You are going to make me hate applesauce." Assessment / Plan / Recommendation CHL IP CLINICAL IMPRESSIONS 05/09/2018 Clinical Impression Pt presents with mild/mild oropharyngeal phase dysphagia characterized by (oral phase) weak lingual manipulation resulting in prolonged oral transit, premature spillage, and mild oral residuals; (pharyngeal phase) reduced timing of laryngeal vestibule closure resulting in rare flash penetration during the swallow with nectars  and silent aspiration of thins via cup sip during the swallow occurring more frequently when taking large sips of thin. Pt with adequate hyolaryngeal excursion and good pharyngeal pressure. Pt with mild oral residuals which pool in the valleculae post primary swallow and are cleared with cue to dry swallow. Chin tuck was attempted and found to be ineffective in eliminating deep penetration. Pt was successful in swallowing small, cup sips of thin without aspiration on ~4 trials. Recommend D3/mech soft and NTL via cup sips, no straws with po meds whole in puree or NTL, encourage Pt to swallow 2x for each sip of liquid. Pt will need f/u SLP services at receiving facility to assist with liquid advancement as appropriate; trials small sips thin liquid with SLP is appropriate. Encourage strong cough. SLP will follow during acute stay.  SLP Visit Diagnosis Dysphagia, oropharyngeal phase (R13.12) Attention and concentration deficit following -- Frontal lobe and executive function deficit following -- Impact on safety and function Moderate aspiration risk   CHL IP TREATMENT RECOMMENDATION 05/07/2018 Treatment Recommendations Therapy as outlined in treatment plan below   Prognosis 05/09/2018 Prognosis for Safe Diet Advancement Good Barriers to Reach Goals (No Data) Barriers/Prognosis Comment -- CHL IP DIET RECOMMENDATION 05/09/2018 SLP Diet Recommendations Dysphagia 3 (Mech soft) solids;Nectar thick liquid Liquid Administration via Cup;No straw Medication Administration Whole meds with puree Compensations Slow rate;Small sips/bites;Lingual sweep for clearance of pocketing Postural Changes Remain semi-upright after after feeds/meals (Comment);Seated upright at 90 degrees   CHL IP OTHER RECOMMENDATIONS 05/09/2018 Recommended Consults -- Oral Care Recommendations Oral care BID;Staff/trained caregiver to provide oral care Other Recommendations Clarify dietary restrictions   CHL IP FOLLOW UP RECOMMENDATIONS 05/09/2018 Follow up  Recommendations Skilled Nursing facility   [facility] IP FREQUENCY AND DURATION 05/07/2018 Speech Therapy Frequency (ACUTE ONLY) min 2x/week Treatment Duration 1 week      CHL IP ORAL PHASE 05/09/2018 Oral Phase Impaired Oral - Pudding Teaspoon -- Oral - Pudding Cup -- Oral - Honey Teaspoon -- Oral - Honey Cup -- Oral - Nectar Teaspoon -- Oral - Nectar Cup WFL Oral - Nectar Straw -- Oral - Thin Teaspoon -- Oral - Thin Cup Premature spillage;Lingual/palatal residue Oral - Thin Straw -- Oral - Puree Lingual/palatal residue Oral - Mech Soft -- Oral - Regular Delayed oral transit;Lingual/palatal residue Oral - Multi-Consistency -- Oral - Pill Delayed oral transit;Reduced posterior propulsion Oral Phase -  Comment min oral residuals cleared with repeat/dry swallow  CHL IP PHARYNGEAL PHASE 05/09/2018 Pharyngeal Phase Impaired Pharyngeal- Pudding Teaspoon -- Pharyngeal -- Pharyngeal- Pudding Cup -- Pharyngeal -- Pharyngeal- Honey Teaspoon -- Pharyngeal -- Pharyngeal- Honey Cup -- Pharyngeal -- Pharyngeal- Nectar Teaspoon -- Pharyngeal -- Pharyngeal- Nectar Cup Delayed swallow initiation-vallecula;Delayed swallow initiation-pyriform sinuses;Reduced airway/laryngeal closure;Penetration/Aspiration during swallow Pharyngeal Material does not enter airway;Material enters airway, remains ABOVE vocal cords then ejected out Pharyngeal- Nectar Straw -- Pharyngeal -- Pharyngeal- Thin Teaspoon -- Pharyngeal -- Pharyngeal- Thin Cup Delayed swallow initiation-vallecula;Delayed swallow initiation-pyriform sinuses;Reduced airway/laryngeal closure;Penetration/Aspiration during swallow;Moderate aspiration;Pharyngeal residue - valleculae;Compensatory strategies attempted (with notebox) Pharyngeal Material enters airway, remains ABOVE vocal cords then ejected out;Material enters airway, CONTACTS cords and not ejected out;Material enters airway, passes BELOW cords without attempt by patient to eject out (silent aspiration) Pharyngeal- Thin Straw --  Pharyngeal -- Pharyngeal- Puree WFL Pharyngeal -- Pharyngeal- Mechanical Soft -- Pharyngeal -- Pharyngeal- Regular WFL Pharyngeal -- Pharyngeal- Multi-consistency -- Pharyngeal -- Pharyngeal- Pill Delayed swallow initiation-vallecula Pharyngeal -- Pharyngeal Comment improved performance with small cup sips thin  CHL IP CERVICAL ESOPHAGEAL PHASE 05/09/2018 Cervical Esophageal Phase WFL Pudding Teaspoon -- Pudding Cup -- Honey Teaspoon -- Honey Cup -- Nectar Teaspoon -- Nectar Cup -- Nectar Straw -- Thin Teaspoon -- Thin Cup -- Thin Straw -- Puree -- Mechanical Soft -- Regular -- Multi-consistency -- Pill -- Cervical Esophageal Comment -- Thank you, Genene Churn, CCC-SLP [phone] 05/09/2018, 4:35 PM                 Subjective: Pt complains of weakness of left upper extremity unchanged from yesterday.  No other complaints.   Discharge Exam: Vitals:   05/10/18 0630 05/10/18 1030  BP: (!) 178/81 125/76  Pulse: 85 81  Resp: 18 18  Temp: 97.9 F (36.6 C) 98.1 F (36.7 C)  SpO2: 96% 99%   Vitals:   05/07/18 0455 05/10/18 0230 05/10/18 0630 05/10/18 1030  BP:  (!) 149/77 (!) 178/81 125/76  Pulse:  82 85 81  Resp:  18 18 18   Temp:  98.1 F (36.7 C) 97.9 F (36.6 C) 98.1 F (36.7 C)  TempSrc:  Oral Oral Oral  SpO2:  98% 96% 99%  Weight: 73.1 kg     Height: 5' 4"  (1.626 m)      General exam: awake, alert, NAD, eating breakfast and cooperative.  Respiratory system: Clear. No increased work of breathing. Cardiovascular system: S1 & S2 heard. No JVD, murmurs, gallops, clicks or pedal edema. Gastrointestinal system: Abdomen is nondistended, soft and nontender. Normal bowel sounds heard. Central nervous system: Alert and oriented. Left hemiparesis unchanged. Weak left grip strength.   Extremities: no cyanosis or clubbing.  The results of significant diagnostics from this hospitalization (including imaging, microbiology, ancillary and laboratory) are listed below for reference.      Microbiology: Recent Results (from the past 240 hour(s))  MRSA PCR Screening     Status: None   Collection Time: 05/07/18  4:40 AM  Result Value Ref Range Status   MRSA by PCR NEGATIVE NEGATIVE Final    Comment:        The GeneXpert MRSA Assay (FDA approved for NASAL specimens only), is one component of a comprehensive MRSA colonization surveillance program. It is not intended to diagnose MRSA infection nor to guide or monitor treatment for MRSA infections. Performed at [facility], [address], [place], [place] [address]      Labs: BNP (last 3 results) No results for  input(s): BNP in the last 8760 hours. Basic Metabolic Panel: Recent Labs  Lab 05/07/18 0258 05/07/18 0310 05/07/18 0347 05/08/18 0517  NA 141 142  --  140  K 4.0 3.8  --  3.6  CL 105 104  --  107  CO2 27  --   --  25  GLUCOSE 156* 152*  --  145*  BUN 11 11  --  14  CREATININE 0.58 0.50  --  0.69  CALCIUM 9.8  --   --  9.6  MG  --   --  1.6* 2.0  PHOS  --   --  3.1  --    Liver Function Tests: Recent Labs  Lab 05/07/18 0258 05/08/18 0517  AST 18 16  ALT 18 16  ALKPHOS 113 108  BILITOT 0.4 0.4  PROT 8.1 7.0  ALBUMIN 4.4 3.9   No results for input(s): LIPASE, AMYLASE in the last 168 hours. No results for input(s): AMMONIA in the last 168 hours. CBC: Recent Labs  Lab 05/07/18 0258 05/07/18 0310 05/08/18 0517  WBC 8.7  --  7.9  NEUTROABS 6.2  --   --   HGB 14.1 14.6 13.0  HCT 43.7 43.0 40.8  MCV 93.4  --  93.8  PLT 213  --  213   Cardiac Enzymes: No results for input(s): CKTOTAL, CKMB, CKMBINDEX, TROPONINI in the last 168 hours. BNP: Invalid input(s): POCBNP CBG: Recent Labs  Lab 05/09/18 0729 05/09/18 1105 05/09/18 1632 05/09/18 2128 05/10/18 0729  GLUCAP 128* 142* 163* 156* 147*   D-Dimer No results for input(s): DDIMER in the last 72 hours. Hgb A1c No results for input(s): HGBA1C in the last 72 hours. Lipid Profile Recent Labs    05/08/18 0517  CHOL 251*   HDL 51  LDLCALC 181*  TRIG 97  CHOLHDL 4.9   Thyroid function studies No results for input(s): TSH, T4TOTAL, T3FREE, THYROIDAB in the last 72 hours.  Invalid input(s): FREET3 Anemia work up No results for input(s): VITAMINB12, FOLATE, FERRITIN, TIBC, IRON, RETICCTPCT in the last 72 hours. Urinalysis    Component Value Date/Time   COLORURINE STRAW (A) 05/07/2018 0213   APPEARANCEUR CLEAR 05/07/2018 0213   LABSPEC 1.005 05/07/2018 0213   PHURINE 7.0 05/07/2018 0213   GLUCOSEU NEGATIVE 05/07/2018 0213   HGBUR NEGATIVE 05/07/2018 0213   BILIRUBINUR NEGATIVE 05/07/2018 0213   KETONESUR NEGATIVE 05/07/2018 0213   PROTEINUR NEGATIVE 05/07/2018 0213   NITRITE NEGATIVE 05/07/2018 0213   LEUKOCYTESUR NEGATIVE 05/07/2018 0213   Sepsis Labs Invalid input(s): PROCALCITONIN,  WBC,  LACTICIDVEN Microbiology Recent Results (from the past 240 hour(s))  MRSA PCR Screening     Status: None   Collection Time: 05/07/18  4:40 AM  Result Value Ref Range Status   MRSA by PCR NEGATIVE NEGATIVE Final    Comment:        The GeneXpert MRSA Assay (FDA approved for NASAL specimens only), is one component of a comprehensive MRSA colonization surveillance program. It is not intended to diagnose MRSA infection nor to guide or monitor treatment for MRSA infections. Performed at [facility], [address], [place], [place] [address]    Time coordinating discharge: 38 minutes   SIGNED:  Irwin Brakeman, MD  [facility] 05/10/2018, 11:10 AM Pager [phone]  If 7PM-7AM, please contact night-coverage [email or web address] Password [email or web address]

## 2018-05-10 NOTE — Care Management Important Message (Signed)
Important Message  Patient Details  Name: [name] MRN: [id number] Date of Birth: 04-Sep-1927   Medicare Important Message Given:  Yes    Sherald Barge, RN 05/10/2018, 8:18 AM

## 2018-05-10 NOTE — Clinical Social Work Placement (Signed)
   CLINICAL SOCIAL WORK PLACEMENT  NOTE  Date:  05/10/2018  Patient Details  Name: [name] MRN: [id number] Date of Birth: June 28, 1927  Clinical Social Work is seeking post-discharge placement for this patient at the [place] level of care (*CSW will initial, date and re-position this form in  chart as items are completed):  Yes   Patient/family provided with [facility] [place] Work Department's list of facilities offering this level of care within the geographic area requested by the patient (or if unable, by the patient's family).  Yes   Patient/family informed of their freedom to choose among providers that offer the needed level of care, that participate in Medicare, Medicaid or managed care program needed by the patient, have an available bed and are willing to accept the patient.  Yes   Patient/family informed of [place]'s ownership interest in [facility] and [facility], as well as of the fact that they are under no obligation to receive care at these facilities.  PASRR submitted to EDS on 05/08/18     PASRR number received on 05/08/18     Existing PASRR number confirmed on       FL2 transmitted to all facilities in geographic area requested by pt/family on 05/08/18     FL2 transmitted to all facilities within larger geographic area on       Patient informed that his/her managed care company has contracts with or will negotiate with certain facilities, including the following:        Yes   Patient/family informed of bed offers received.  Patient chooses bed at [facility]     Physician recommends and patient chooses bed at      Patient to be transferred to [facility] on 05/10/18.  Patient to be transferred to facility by       Patient family notified on 05/10/18 of transfer.  Name of family member notified:  pt and husband at bedside     PHYSICIAN       Additional Comment: Pt stable for dc today per MD.  Updated pt and her husband at bedside. They remain in agreement with the plan to dc to [facility]. Updated Gerald Stabs at [facility]. Updated RN who will call report and EMS when pt ready. There are no other CSW needs for dc.   _______________________________________________ Shade Flood, LCSW 05/10/2018, 11:44 AM

## 2018-05-13 ENCOUNTER — Telehealth: Payer: Self-pay

## 2018-05-13 NOTE — Telephone Encounter (Signed)
Dr Alean Rinne was aware that coumadin was stopped and wanted to make sure that pt was supposed to be on plavix and ASA - and also that there was no hx of afib which is not listed in past medical hx

## 2018-05-13 NOTE — Telephone Encounter (Signed)
Dr. Ronne Binning called stating that patient is at the [facility]. States she recently had a stroke. He is wanting to know why patient is on coumdin.  Please call [phone].

## 2018-05-15 ENCOUNTER — Telehealth: Payer: Self-pay | Admitting: Cardiovascular Disease

## 2018-05-15 NOTE — Telephone Encounter (Signed)
Debbie called reference her mother [place] [employer] [place] while she was in hospital.  Stated they do not have any idea why the was sent or who may have ordered  it

## 2018-05-15 NOTE — Telephone Encounter (Signed)
Daughter advised that monitor was ordered at recent hospital discharge. Verbalized understanding.

## 2018-05-17 ENCOUNTER — Telehealth: Payer: Self-pay | Admitting: *Deleted

## 2018-05-17 NOTE — Telephone Encounter (Signed)
Fax received from [employer] stating unable to hookup pt after 48 hours. Called pt, no answer. Left msg to call back.

## 2018-05-21 ENCOUNTER — Ambulatory Visit (INDEPENDENT_AMBULATORY_CARE_PROVIDER_SITE_OTHER): Payer: Medicare Other

## 2018-05-21 DIAGNOSIS — G459 Transient cerebral ischemic attack, unspecified: Secondary | ICD-10-CM | POA: Diagnosis not present

## 2018-05-21 DIAGNOSIS — I639 Cerebral infarction, unspecified: Secondary | ICD-10-CM | POA: Diagnosis not present

## 2018-05-21 NOTE — Telephone Encounter (Signed)
Pt's daughter Veva Holes is returning call for her mother, can be reached @ [phone]

## 2018-05-21 NOTE — Telephone Encounter (Signed)
I spoke with daughter Suzi Roots, she states mother is in the [facility] in [place] and was told the monitor needed charging by staff, but, patient was wearing it.   I called [employer] [phone]  And spoke with Verdis Frederickson who verified monitor was working

## 2018-06-03 ENCOUNTER — Encounter: Payer: Self-pay | Admitting: Cardiovascular Disease

## 2018-06-03 ENCOUNTER — Ambulatory Visit (INDEPENDENT_AMBULATORY_CARE_PROVIDER_SITE_OTHER): Payer: Medicare Other | Admitting: Cardiovascular Disease

## 2018-06-03 VITALS — BP 140/80 | HR 74 | Ht 64.0 in | Wt 166.0 lb

## 2018-06-03 DIAGNOSIS — I350 Nonrheumatic aortic (valve) stenosis: Secondary | ICD-10-CM

## 2018-06-03 DIAGNOSIS — I6523 Occlusion and stenosis of bilateral carotid arteries: Secondary | ICD-10-CM

## 2018-06-03 DIAGNOSIS — Z955 Presence of coronary angioplasty implant and graft: Secondary | ICD-10-CM

## 2018-06-03 DIAGNOSIS — I10 Essential (primary) hypertension: Secondary | ICD-10-CM

## 2018-06-03 DIAGNOSIS — E785 Hyperlipidemia, unspecified: Secondary | ICD-10-CM | POA: Diagnosis not present

## 2018-06-03 DIAGNOSIS — I25118 Atherosclerotic heart disease of native coronary artery with other forms of angina pectoris: Secondary | ICD-10-CM | POA: Diagnosis not present

## 2018-06-03 DIAGNOSIS — I639 Cerebral infarction, unspecified: Secondary | ICD-10-CM

## 2018-06-03 NOTE — Patient Instructions (Signed)
Medication Instructions:  Continue all current medications.  Labwork:  FLP - order given today.   Reminder:  Nothing to eat or drink after 12 midnight prior to labs.  Please do in 2 months.   Office will contact with results via phone or letter.    Testing/Procedures: none  Follow-Up: Your physician wants you to follow up in: 6 months.  You will receive a reminder letter in the mail one-two months in advance.  If you don't receive a letter, please call our office to schedule the follow up appointment   Any Other Special Instructions Will Be Listed Below (If Applicable).  If you need a refill on your cardiac medications before your next appointment, please call your pharmacy.

## 2018-06-03 NOTE — Progress Notes (Signed)
SUBJECTIVE: The patient presents for posthospitalization follow-up.  She was diagnosed with an acute CVA in early December 2019.  It was recommended she wear a 30-day event monitor.  Echocardiogram on 05/07/2018 demonstrated normal left ventricular systolic function and regional wall motion, LVEF 60 to 65%, mild LVH, grade 1 diastolic dysfunction, and moderate aortic stenosis.  She has coronary artery disease and underwent PCI with drug-eluting stent placement to the distal RCA and proximal LAD in September 2019.  She is currently residing at the [facility].  She is here with her husband and daughter, Jackelyn Poling.  She has been undergoing physical rehabilitation and progressing well as per family members.  Speech continues to improve.  She denies exertional chest pain.  She did have a recent fall and a CT scan reportedly showed no acute abnormalities.  She denies palpitations and leg swelling.  She is wearing an event monitor.    Review of Systems: As per "subjective", otherwise negative.  Allergies  Allergen Reactions  . Ivp Dye [Iodinated Diagnostic Agents] Anaphylaxis  . Sulfonamide Derivatives Shortness Of Breath  . Lipitor [Atorvastatin Calcium]     Muscle Aches  . Zetia [Ezetimibe]     Muscle Aches  . Penicillins Rash    As a teenager. Has patient had a PCN reaction causing immediate rash, facial/tongue/throat swelling, SOB or lightheadedness with hypotension: No Has patient had a PCN reaction causing severe rash involving mucus membranes or skin necrosis: No Has patient had a PCN reaction that required hospitalization: No Has patient had a PCN reaction occurring within the last 10 years: No If all of the above answers are "NO", then may proceed with Cephalosporin use.     Current Outpatient Medications  Medication Sig Dispense Refill  . aspirin EC 81 MG EC tablet Take 1 tablet (81 mg total) by mouth daily.    . carboxymethylcellulose (REFRESH) 1 % ophthalmic solution  Place 1 drop into both eyes 2 (two) times daily as needed (dry eyes).     . clopidogrel (PLAVIX) 75 MG tablet Take 1 tablet (75 mg total) by mouth daily with breakfast. 90 tablet 3  . docusate sodium (COLACE) 100 MG capsule Take 200 mg by mouth 2 (two) times daily as needed for mild constipation.     . fexofenadine (ALLEGRA) 180 MG tablet Take 180 mg by mouth daily.      . fluticasone (FLONASE) 50 MCG/ACT nasal spray Place 2 sprays into both nostrils daily.     Marland Kitchen losartan (COZAAR) 25 MG tablet Take 1 tablet (25 mg total) by mouth daily.    . metFORMIN (GLUCOPHAGE-XR) 500 MG 24 hr tablet Take 1 tablet (500 mg total) by mouth 2 (two) times daily with a meal.    . metoprolol succinate (TOPROL-XL) 25 MG 24 hr tablet Take 1 tablet (25 mg total) by mouth daily. 90 tablet 3  . Multiple Vitamin (MULTIVITAMIN) tablet Take 1 tablet by mouth daily.      . nitroGLYCERIN (NITROSTAT) 0.4 MG SL tablet Place 0.4 mg under the tongue every 5 (five) minutes as needed for chest pain.    . pantoprazole (PROTONIX) 40 MG tablet Take 1 tablet (40 mg total) by mouth daily. 90 tablet 3  . rosuvastatin (CRESTOR) 5 MG tablet Take 1 tablet (5 mg total) by mouth daily at 6 PM. 15 tablet 3  . ursodiol (ACTIGALL) 300 MG capsule Take 300 mg by mouth 2 (two) times daily.     . vitamin E (VITAMIN  E) 400 UNIT capsule Take 400 Units by mouth 2 (two) times daily.      No current facility-administered medications for this visit.     Past Medical History:  Diagnosis Date  . Allergic rhinitis   . Arthritis    "probably in my hands" (02/26/2018)  . Colonic polyp   . Coronary artery disease    a. s/p cath on 02/26/2018 with 3-vessel CAD with 95% proximal LAD, 90% distal RCA, and 70% OM1 stenosis.  She underwent successful PCI with DES x1 to the distal RCA and DES x1 to the proximal LAD  . GERD (gastroesophageal reflux disease)   . History of blood transfusion    "don't remember why or when" (02/26/2018)  . Hyperlipidemia   .  Hypertension   . Nonalcoholic hepatosteatosis   . TIA (transient ischemic attack) 2000s   "I've had ~ 3" (02/26/2018)  . Type II diabetes mellitus ([place])     Past Surgical History:  Procedure Laterality Date  . ABDOMINAL HYSTERECTOMY    . APPENDECTOMY  1950s  . CARDIAC CATHETERIZATION  2001   Nonobstructive CAD  . CATARACT EXTRACTION W/ INTRAOCULAR LENS  IMPLANT, BILATERAL Bilateral 2000s  . CORONARY STENT INTERVENTION N/A 02/26/2018   Procedure: CORONARY STENT INTERVENTION;  Surgeon: Martinique, Peter M, MD;  Location: [facility];  Service: Cardiovascular;  Laterality: N/A;  rca   . LEFT HEART CATH AND CORONARY ANGIOGRAPHY N/A 02/26/2018   Procedure: LEFT HEART CATH AND CORONARY ANGIOGRAPHY;  Surgeon: Martinique, Peter M, MD;  Location: [facility];  Service: Cardiovascular;  Laterality: N/A;  . TONSILLECTOMY  1943    Social History   Socioeconomic History  . Marital status: Married    Spouse name: Not on file  . Number of children: Not on file  . Years of education: Not on file  . Highest education level: Not on file  Occupational History  . Not on file  Social Needs  . Financial resource strain: Not on file  . Food insecurity:    Worry: Not on file    Inability: Not on file  . Transportation needs:    Medical: Not on file    Non-medical: Not on file  Tobacco Use  . Smoking status: Former Smoker    Packs/day: 2.00    Years: 40.00    Pack years: 80.00    Types: Cigarettes    Last attempt to quit: 06/05/1992    Years since quitting: 26.0  . Smokeless tobacco: Former Systems developer    Types: Chew    Quit date: 12/03/1992  . Tobacco comment: used tobacco to help quit smoking  Substance and Sexual Activity  . Alcohol use: Never    Frequency: Never  . Drug use: Never  . Sexual activity: Not on file  Lifestyle  . Physical activity:    Days per week: Not on file    Minutes per session: Not on file  . Stress: Not on file  Relationships  . Social connections:    Talks on  phone: Not on file    Gets together: Not on file    Attends religious service: Not on file    Active member of club or organization: Not on file    Attends meetings of clubs or organizations: Not on file    Relationship status: Not on file  . Intimate partner violence:    Fear of current or ex partner: Not on file    Emotionally abused: Not on file  Physically abused: Not on file    Forced sexual activity: Not on file  Other Topics Concern  . Not on file  Social History Narrative  . Not on file     Vitals:   06/03/18 1335  BP: 140/80  Pulse: 74  SpO2: 94%  Weight: 166 lb (75.3 kg)  Height: 5' 4"  (1.626 m)    Wt Readings from Last 3 Encounters:  06/03/18 166 lb (75.3 kg)  05/07/18 161 lb 2.5 oz (73.1 kg)  04/02/18 166 lb 12.8 oz (75.7 kg)     PHYSICAL EXAM General: NAD HEENT: Normal. Neck: No JVD, no thyromegaly. Lungs: Clear to auscultation bilaterally with normal respiratory effort. CV: Regular rate and rhythm, normal S1/S2, no Y3/J0, 2/6 systolic murmur over right upper sternal border. No pretibial or periankle edema.   Abdomen: Soft, nontender, no distention.  Neurologic: Alert and oriented.  Psych: Normal affect. Skin: Normal. Musculoskeletal: No gross deformities.    ECG: Reviewed above under Subjective   Labs: Lab Results  Component Value Date/Time   K 3.6 05/08/2018 05:17 AM   BUN 14 05/08/2018 05:17 AM   CREATININE 0.69 05/08/2018 05:17 AM   CREATININE 0.69 02/20/2018 07:39 AM   ALT 16 05/08/2018 05:17 AM   HGB 13.0 05/08/2018 05:17 AM     Lipids: Lab Results  Component Value Date/Time   LDLCALC 181 (H) 05/08/2018 05:17 AM   CHOL 251 (H) 05/08/2018 05:17 AM   TRIG 97 05/08/2018 05:17 AM   HDL 51 05/08/2018 05:17 AM       ASSESSMENT AND PLAN: 1.  Coronary artery disease: Symptomatically stable.  Status post drug-eluting stent placement to the distal RCA and proximal LAD in September 2019.  Currently on dual antiplatelet therapy with  aspirin and Plavix along with metoprolol succinate and rosuvastatin.  2.  Hypertension: Blood pressure is mildly elevated.  No changes.  3.  Hyperlipidemia: Previously intolerant of atorvastatin, simvastatin, and Zetia due to myalgias.  She has wished to avoid all injectable medications.  Currently on Crestor 5 mg daily after CVA.  I will repeat lipids in 2 months.  4.  CVA: Receiving physical therapy at the [facility].  Currently on dual antiplatelet therapy with aspirin and Plavix along with statin therapy.  Wearing a 30-day event monitor.  She denies palpitations.  5.  Aortic stenosis: Moderate in severity.  I will monitor clinically and with surveillance echocardiography.   Disposition: Follow up 6 months   Kate Sable, M.D., F.A.C.C.

## 2018-06-08 ENCOUNTER — Telehealth: Payer: Self-pay | Admitting: Cardiovascular Disease

## 2018-06-08 NOTE — Telephone Encounter (Signed)
Called by [employer]. patient pushed alarm button indicating syncope. ECG at that time showing artifact. [employer] could not reach patient. I called husband. Patient herself is in rehab facility and has no phone. He is not aware of her having a fall. He will call if he finds out more    Brigitta Pricer. MD

## 2018-07-01 ENCOUNTER — Telehealth: Payer: Self-pay | Admitting: Nutrition

## 2018-07-01 NOTE — Telephone Encounter (Signed)
PT's husband called and left message that she has had a stroke and needs to cancel appt.

## 2018-07-03 ENCOUNTER — Ambulatory Visit: Payer: Medicare Other | Admitting: Nutrition

## 2018-07-09 ENCOUNTER — Telehealth: Payer: Self-pay | Admitting: Cardiovascular Disease

## 2018-07-09 NOTE — Telephone Encounter (Signed)
[place] would like to verify medication dose for patient for following medications. Stated that when patient was discharged from [facility] there was a difference in what Dr Bronson Ing and [place] and documented. losartan (COZAAR) 25 MG tablet  [facility] discharged her with taking  50 mg  metoprolol succinate (TOPROL-XL) 25 MG 24 hr tablet   [facility] discharged her with taking 100 mg two times a day

## 2018-07-10 NOTE — Telephone Encounter (Signed)
What are BP and HR currently? If both are normal, it would be reasonable to stay on those doses.

## 2018-07-10 NOTE — Telephone Encounter (Signed)
[name], NP says pt has been taking losartan 50 mg daily and Toprol XL 100 mg bid per [facility], she is not sure how this was changed but wanted Dr Court Joy input if she should be changed back to losartan 25 mg daily and toprol XL 25 mg daily

## 2018-07-10 NOTE — Telephone Encounter (Signed)
I agree with those recommendations. Continue present med dosages.

## 2018-07-10 NOTE — Telephone Encounter (Signed)
BP 158/76 HR 69 at recent OV with NP - pt was advised at that time to continue current dose and keep BP/HR log and bring back to appt later this month with [name], NP

## 2018-07-23 ENCOUNTER — Telehealth: Payer: Self-pay

## 2018-07-23 NOTE — Telephone Encounter (Signed)
I spoke with patient and spouse and they states she is no longer on warfarin, only ASA. I will forward this note to Dr.[place]

## 2018-07-23 NOTE — Telephone Encounter (Signed)
-----   Message from Ana Commons, MD sent at 07/05/2018  9:47 AM EST ----- Regarding: A fib This patient is on warfarin for recurrent TIA's/CVA's. I would have her speak with her neurologist about potentially switching to Eliquis as her event monitor shows episodes of atrial fibrillation and it is possible her INR may have been subtherapeutic at times, hence leading to strokes.

## 2018-08-21 ENCOUNTER — Encounter: Payer: Self-pay | Admitting: *Deleted

## 2018-09-03 ENCOUNTER — Emergency Department (HOSPITAL_COMMUNITY): Payer: Medicare Other

## 2018-09-03 ENCOUNTER — Encounter (HOSPITAL_COMMUNITY): Payer: Self-pay | Admitting: Emergency Medicine

## 2018-09-03 ENCOUNTER — Other Ambulatory Visit: Payer: Self-pay | Admitting: Cardiovascular Disease

## 2018-09-03 ENCOUNTER — Inpatient Hospital Stay (HOSPITAL_COMMUNITY): Payer: Medicare Other

## 2018-09-03 ENCOUNTER — Other Ambulatory Visit: Payer: Self-pay

## 2018-09-03 ENCOUNTER — Inpatient Hospital Stay (HOSPITAL_COMMUNITY)
Admission: EM | Admit: 2018-09-03 | Discharge: 2018-09-06 | DRG: 246 | Disposition: A | Discharge status: Home Health | Payer: Medicare Other | Attending: Cardiology | Admitting: Cardiology

## 2018-09-03 DIAGNOSIS — I160 Hypertensive urgency: Secondary | ICD-10-CM | POA: Diagnosis not present

## 2018-09-03 DIAGNOSIS — Z7984 Long term (current) use of oral hypoglycemic drugs: Secondary | ICD-10-CM | POA: Diagnosis not present

## 2018-09-03 DIAGNOSIS — I340 Nonrheumatic mitral (valve) insufficiency: Secondary | ICD-10-CM

## 2018-09-03 DIAGNOSIS — I2510 Atherosclerotic heart disease of native coronary artery without angina pectoris: Secondary | ICD-10-CM | POA: Diagnosis present

## 2018-09-03 DIAGNOSIS — I208 Other forms of angina pectoris: Secondary | ICD-10-CM | POA: Diagnosis present

## 2018-09-03 DIAGNOSIS — I480 Paroxysmal atrial fibrillation: Secondary | ICD-10-CM | POA: Diagnosis not present

## 2018-09-03 DIAGNOSIS — E7800 Pure hypercholesterolemia, unspecified: Secondary | ICD-10-CM | POA: Diagnosis not present

## 2018-09-03 DIAGNOSIS — I350 Nonrheumatic aortic (valve) stenosis: Secondary | ICD-10-CM | POA: Diagnosis not present

## 2018-09-03 DIAGNOSIS — Z79899 Other long term (current) drug therapy: Secondary | ICD-10-CM

## 2018-09-03 DIAGNOSIS — R29706 NIHSS score 6: Secondary | ICD-10-CM | POA: Diagnosis present

## 2018-09-03 DIAGNOSIS — R471 Dysarthria and anarthria: Secondary | ICD-10-CM | POA: Diagnosis present

## 2018-09-03 DIAGNOSIS — R778 Other specified abnormalities of plasma proteins: Secondary | ICD-10-CM

## 2018-09-03 DIAGNOSIS — E1151 Type 2 diabetes mellitus with diabetic peripheral angiopathy without gangrene: Secondary | ICD-10-CM | POA: Diagnosis present

## 2018-09-03 DIAGNOSIS — Z8673 Personal history of transient ischemic attack (TIA), and cerebral infarction without residual deficits: Secondary | ICD-10-CM

## 2018-09-03 DIAGNOSIS — I25118 Atherosclerotic heart disease of native coronary artery with other forms of angina pectoris: Secondary | ICD-10-CM | POA: Diagnosis not present

## 2018-09-03 DIAGNOSIS — I69354 Hemiplegia and hemiparesis following cerebral infarction affecting left non-dominant side: Secondary | ICD-10-CM | POA: Diagnosis not present

## 2018-09-03 DIAGNOSIS — Z87891 Personal history of nicotine dependence: Secondary | ICD-10-CM

## 2018-09-03 DIAGNOSIS — Z880 Allergy status to penicillin: Secondary | ICD-10-CM

## 2018-09-03 DIAGNOSIS — I248 Other forms of acute ischemic heart disease: Secondary | ICD-10-CM

## 2018-09-03 DIAGNOSIS — R531 Weakness: Secondary | ICD-10-CM

## 2018-09-03 DIAGNOSIS — Z7902 Long term (current) use of antithrombotics/antiplatelets: Secondary | ICD-10-CM

## 2018-09-03 DIAGNOSIS — I361 Nonrheumatic tricuspid (valve) insufficiency: Secondary | ICD-10-CM | POA: Diagnosis not present

## 2018-09-03 DIAGNOSIS — Z9842 Cataract extraction status, left eye: Secondary | ICD-10-CM | POA: Diagnosis not present

## 2018-09-03 DIAGNOSIS — Z91041 Radiographic dye allergy status: Secondary | ICD-10-CM

## 2018-09-03 DIAGNOSIS — Z8249 Family history of ischemic heart disease and other diseases of the circulatory system: Secondary | ICD-10-CM

## 2018-09-03 DIAGNOSIS — Z90710 Acquired absence of both cervix and uterus: Secondary | ICD-10-CM

## 2018-09-03 DIAGNOSIS — I214 Non-ST elevation (NSTEMI) myocardial infarction: Principal | ICD-10-CM | POA: Diagnosis present

## 2018-09-03 DIAGNOSIS — R9431 Abnormal electrocardiogram [ECG] [EKG]: Secondary | ICD-10-CM | POA: Insufficient documentation

## 2018-09-03 DIAGNOSIS — R410 Disorientation, unspecified: Secondary | ICD-10-CM

## 2018-09-03 DIAGNOSIS — Z7951 Long term (current) use of inhaled steroids: Secondary | ICD-10-CM | POA: Diagnosis not present

## 2018-09-03 DIAGNOSIS — R7989 Other specified abnormal findings of blood chemistry: Secondary | ICD-10-CM

## 2018-09-03 DIAGNOSIS — Z8042 Family history of malignant neoplasm of prostate: Secondary | ICD-10-CM

## 2018-09-03 DIAGNOSIS — Z961 Presence of intraocular lens: Secondary | ICD-10-CM | POA: Diagnosis present

## 2018-09-03 DIAGNOSIS — F0390 Unspecified dementia without behavioral disturbance: Secondary | ICD-10-CM | POA: Diagnosis present

## 2018-09-03 DIAGNOSIS — Z955 Presence of coronary angioplasty implant and graft: Secondary | ICD-10-CM

## 2018-09-03 DIAGNOSIS — Z7982 Long term (current) use of aspirin: Secondary | ICD-10-CM | POA: Diagnosis not present

## 2018-09-03 DIAGNOSIS — I255 Ischemic cardiomyopathy: Secondary | ICD-10-CM | POA: Diagnosis present

## 2018-09-03 DIAGNOSIS — Z9841 Cataract extraction status, right eye: Secondary | ICD-10-CM

## 2018-09-03 DIAGNOSIS — M79602 Pain in left arm: Secondary | ICD-10-CM | POA: Diagnosis not present

## 2018-09-03 DIAGNOSIS — I083 Combined rheumatic disorders of mitral, aortic and tricuspid valves: Secondary | ICD-10-CM | POA: Diagnosis not present

## 2018-09-03 DIAGNOSIS — K219 Gastro-esophageal reflux disease without esophagitis: Secondary | ICD-10-CM | POA: Diagnosis present

## 2018-09-03 DIAGNOSIS — K7581 Nonalcoholic steatohepatitis (NASH): Secondary | ICD-10-CM | POA: Diagnosis not present

## 2018-09-03 DIAGNOSIS — M1990 Unspecified osteoarthritis, unspecified site: Secondary | ICD-10-CM | POA: Diagnosis present

## 2018-09-03 DIAGNOSIS — I6340 Cerebral infarction due to embolism of unspecified cerebral artery: Secondary | ICD-10-CM | POA: Diagnosis not present

## 2018-09-03 DIAGNOSIS — E785 Hyperlipidemia, unspecified: Secondary | ICD-10-CM | POA: Diagnosis present

## 2018-09-03 DIAGNOSIS — I639 Cerebral infarction, unspecified: Secondary | ICD-10-CM | POA: Diagnosis not present

## 2018-09-03 DIAGNOSIS — E1165 Type 2 diabetes mellitus with hyperglycemia: Secondary | ICD-10-CM | POA: Diagnosis present

## 2018-09-03 DIAGNOSIS — Z87892 Personal history of anaphylaxis: Secondary | ICD-10-CM

## 2018-09-03 DIAGNOSIS — I10 Essential (primary) hypertension: Secondary | ICD-10-CM | POA: Diagnosis present

## 2018-09-03 DIAGNOSIS — E1159 Type 2 diabetes mellitus with other circulatory complications: Secondary | ICD-10-CM | POA: Diagnosis not present

## 2018-09-03 DIAGNOSIS — G459 Transient cerebral ischemic attack, unspecified: Secondary | ICD-10-CM | POA: Diagnosis present

## 2018-09-03 DIAGNOSIS — Z882 Allergy status to sulfonamides status: Secondary | ICD-10-CM

## 2018-09-03 DIAGNOSIS — Z888 Allergy status to other drugs, medicaments and biological substances status: Secondary | ICD-10-CM

## 2018-09-03 DIAGNOSIS — E119 Type 2 diabetes mellitus without complications: Secondary | ICD-10-CM

## 2018-09-03 HISTORY — DX: Cerebral infarction, unspecified: I63.9

## 2018-09-03 HISTORY — DX: Ischemic cardiomyopathy: I25.5

## 2018-09-03 HISTORY — DX: Adverse effect of other drugs, medicaments and biological substances, initial encounter: T50.995A

## 2018-09-03 HISTORY — DX: Unspecified dementia, unspecified severity, without behavioral disturbance, psychotic disturbance, mood disturbance, and anxiety: F03.90

## 2018-09-03 HISTORY — DX: Nonrheumatic mitral (valve) insufficiency: I34.0

## 2018-09-03 HISTORY — DX: Nonrheumatic aortic (valve) stenosis: I35.0

## 2018-09-03 HISTORY — DX: Paroxysmal atrial fibrillation: I48.0

## 2018-09-03 LAB — LIPID PANEL
Cholesterol: 227 mg/dL — ABNORMAL HIGH (ref 0–200)
HDL: 55 mg/dL (ref >40)
LDL Cholesterol: 143 mg/dL — ABNORMAL HIGH (ref 0–99)
Total CHOL/HDL Ratio: 4.1 RATIO
Triglycerides: 146 mg/dL (ref <150)
VLDL: 29 mg/dL (ref 0–40)

## 2018-09-03 LAB — PROTIME-INR
INR: 1.1 (ref 0.8–1.2)
Prothrombin Time: 14.2 seconds (ref 11.4–15.2)

## 2018-09-03 LAB — CBC WITH DIFFERENTIAL/PLATELET
Abs Immature Granulocytes: 0.04 10*3/uL (ref 0.00–0.07)
Basophils Absolute: 0.1 10*3/uL (ref 0.0–0.1)
Basophils Relative: 0 %
Eosinophils Absolute: 0.1 10*3/uL (ref 0.0–0.5)
Eosinophils Relative: 1 %
HCT: 43.6 % (ref 36.0–46.0)
Hemoglobin: 14 g/dL (ref 12.0–15.0)
Immature Granulocytes: 0 %
Lymphocytes Relative: 17 %
Lymphs Abs: 2 10*3/uL (ref 0.7–4.0)
MCH: 30.6 pg (ref 26.0–34.0)
MCHC: 32.1 g/dL (ref 30.0–36.0)
MCV: 95.4 fL (ref 80.0–100.0)
MONO ABS: 0.8 10*3/uL (ref 0.1–1.0)
MONOS PCT: 7 %
Neutro Abs: 8.5 10*3/uL — ABNORMAL HIGH (ref 1.7–7.7)
Neutrophils Relative %: 75 %
Platelets: 250 10*3/uL (ref 150–400)
RBC: 4.57 MIL/uL (ref 3.87–5.11)
RDW: 12.9 % (ref 11.5–15.5)
WBC: 11.5 10*3/uL — ABNORMAL HIGH (ref 4.0–10.5)
nRBC: 0 % (ref 0.0–0.2)

## 2018-09-03 LAB — COMPREHENSIVE METABOLIC PANEL
ALK PHOS: 136 U/L — AB (ref 38–126)
ALT: 11 U/L (ref 0–44)
AST: 20 U/L (ref 15–41)
Albumin: 4 g/dL (ref 3.5–5.0)
Anion gap: 11 (ref 5–15)
BUN: 15 mg/dL (ref 8–23)
CO2: 29 mmol/L (ref 22–32)
Calcium: 9.9 mg/dL (ref 8.9–10.3)
Chloride: 101 mmol/L (ref 98–111)
Creatinine, Ser: 0.66 mg/dL (ref 0.44–1.00)
GFR calc Af Amer: >60 mL/min (ref >60)
GFR calc non Af Amer: >60 mL/min (ref >60)
Glucose, Bld: 214 mg/dL — ABNORMAL HIGH (ref 70–99)
Potassium: 4.2 mmol/L (ref 3.5–5.1)
Sodium: 141 mmol/L (ref 135–145)
Total Bilirubin: 0.4 mg/dL (ref 0.3–1.2)
Total Protein: 7.4 g/dL (ref 6.5–8.1)

## 2018-09-03 LAB — TROPONIN I
Troponin I: 0.43 ng/mL (ref <0.03)
Troponin I: 0.28 ng/mL (ref <0.03)
Troponin I: 0.48 ng/mL (ref <0.03)
Troponin I: 1.9 ng/mL (ref <0.03)

## 2018-09-03 LAB — GLUCOSE, CAPILLARY
Glucose-Capillary: 158 mg/dL — ABNORMAL HIGH (ref 70–99)
Glucose-Capillary: 170 mg/dL — ABNORMAL HIGH (ref 70–99)
Glucose-Capillary: 129 mg/dL — ABNORMAL HIGH (ref 70–99)
Glucose-Capillary: 120 mg/dL — ABNORMAL HIGH (ref 70–99)

## 2018-09-03 LAB — HEMOGLOBIN A1C
Hgb A1c MFr Bld: 7.6 % — ABNORMAL HIGH (ref 4.8–5.6)
Mean Plasma Glucose: 171.42 mg/dL

## 2018-09-03 LAB — APTT
aPTT: 33 seconds (ref 24–36)
aPTT: 64 seconds — ABNORMAL HIGH (ref 24–36)

## 2018-09-03 LAB — CBG MONITORING, ED: Glucose-Capillary: 181 mg/dL — ABNORMAL HIGH (ref 70–99)

## 2018-09-03 LAB — BRAIN NATRIURETIC PEPTIDE
B NATRIURETIC PEPTIDE 5: 415 pg/mL — AB (ref 0.0–100.0)
B Natriuretic Peptide: 463 pg/mL — ABNORMAL HIGH (ref 0.0–100.0)

## 2018-09-03 LAB — PHOSPHORUS: Phosphorus: 3.6 mg/dL (ref 2.5–4.6)

## 2018-09-03 LAB — D-DIMER, QUANTITATIVE: D-Dimer, Quant: 0.35 ug/mL-FEU (ref 0.00–0.50)

## 2018-09-03 LAB — HEPARIN LEVEL (UNFRACTIONATED): Heparin Unfractionated: 1.24 IU/mL — ABNORMAL HIGH (ref 0.30–0.70)

## 2018-09-03 LAB — MRSA PCR SCREENING: MRSA by PCR: NEGATIVE

## 2018-09-03 LAB — MAGNESIUM: Magnesium: 1.4 mg/dL — ABNORMAL LOW (ref 1.7–2.4)

## 2018-09-03 LAB — CALCIUM: Calcium: 9.5 mg/dL (ref 8.9–10.3)

## 2018-09-03 LAB — TSH: TSH: 2.12 u[IU]/mL (ref 0.350–4.500)

## 2018-09-03 MED ORDER — SORBITOL 70 % SOLN: 30.0000 mL | Freq: Every day | Status: DC | PRN

## 2018-09-03 MED ORDER — SODIUM CHLORIDE 0.9 % WEIGHT BASED INFUSION: 3.0000 mL/kg/h | INTRAVENOUS | Status: AC

## 2018-09-03 MED ORDER — HYDROCODONE-ACETAMINOPHEN 5-325 MG PO TABS: 1.0000 | ORAL_TABLET | ORAL | Status: DC | PRN

## 2018-09-03 MED ORDER — TRAZODONE HCL 50 MG PO TABS: 50.0000 mg | ORAL_TABLET | Freq: Every evening | ORAL | Status: DC | PRN

## 2018-09-03 MED ORDER — ROSUVASTATIN CALCIUM 5 MG PO TABS: 5.0000 mg | ORAL_TABLET | Freq: Every day | ORAL | Status: DC

## 2018-09-03 MED ORDER — KETOROLAC TROMETHAMINE 15 MG/ML IJ SOLN
15.0000 mg | Freq: Four times a day (QID) | INTRAMUSCULAR | Status: DC | PRN
  Administered 2018-09-05: 15 mg via INTRAVENOUS
  Filled 2018-09-03: qty 1

## 2018-09-03 MED ORDER — ONDANSETRON HCL 4 MG/2ML IJ SOLN: 4.0000 mg | Freq: Four times a day (QID) | INTRAMUSCULAR | Status: DC | PRN

## 2018-09-03 MED ORDER — ALBUTEROL SULFATE (2.5 MG/3ML) 0.083% IN NEBU
5.0000 mg | INHALATION_SOLUTION | Freq: Once | RESPIRATORY_TRACT | Status: AC
  Administered 2018-09-03: 5 mg via RESPIRATORY_TRACT
  Filled 2018-09-03: qty 6

## 2018-09-03 MED ORDER — SODIUM CHLORIDE 0.9 % WEIGHT BASED INFUSION: 1.0000 mL/kg/h | INTRAVENOUS | Status: DC

## 2018-09-03 MED ORDER — ALBUTEROL SULFATE (2.5 MG/3ML) 0.083% IN NEBU: 2.5000 mg | INHALATION_SOLUTION | RESPIRATORY_TRACT | Status: DC | PRN

## 2018-09-03 MED ORDER — ASPIRIN EC 81 MG PO TBEC
81.0000 mg | DELAYED_RELEASE_TABLET | Freq: Every day | ORAL | Status: DC
  Administered 2018-09-03: 81 mg via ORAL
  Filled 2018-09-03 (×2): qty 1

## 2018-09-03 MED ORDER — SENNOSIDES-DOCUSATE SODIUM 8.6-50 MG PO TABS: 1.0000 | ORAL_TABLET | Freq: Every evening | ORAL | Status: DC | PRN

## 2018-09-03 MED ORDER — SODIUM CHLORIDE 0.9% FLUSH: 3.0000 mL | Freq: Two times a day (BID) | INTRAVENOUS | Status: DC

## 2018-09-03 MED ORDER — CLOPIDOGREL BISULFATE 75 MG PO TABS: 75.0000 mg | ORAL_TABLET | Freq: Every day | ORAL | Status: DC

## 2018-09-03 MED ORDER — SODIUM CHLORIDE 0.9 % IV SOLN: 250.0000 mL | INTRAVENOUS | Status: DC | PRN

## 2018-09-03 MED ORDER — ACETAMINOPHEN 650 MG RE SUPP: 650.0000 mg | Freq: Four times a day (QID) | RECTAL | Status: DC | PRN

## 2018-09-03 MED ORDER — HEPARIN BOLUS VIA INFUSION
3900.0000 [IU] | Freq: Once | INTRAVENOUS | Status: AC
  Administered 2018-09-03: 3900 [IU] via INTRAVENOUS

## 2018-09-03 MED ORDER — SODIUM CHLORIDE 0.9 % IV SOLN
INTRAVENOUS | Status: DC
  Administered 2018-09-03: 12:00:00 via INTRAVENOUS

## 2018-09-03 MED ORDER — MORPHINE SULFATE (PF) 2 MG/ML IV SOLN: 2.0000 mg | INTRAVENOUS | Status: DC | PRN

## 2018-09-03 MED ORDER — PANTOPRAZOLE SODIUM 40 MG PO TBEC
40.0000 mg | DELAYED_RELEASE_TABLET | Freq: Every day | ORAL | Status: DC
  Administered 2018-09-03 – 2018-09-06 (×4): 40 mg via ORAL
  Filled 2018-09-03 (×4): qty 1

## 2018-09-03 MED ORDER — HEPARIN (PORCINE) 25000 UT/250ML-% IV SOLN
1050.0000 [IU]/h | INTRAVENOUS | Status: DC
  Administered 2018-09-03: 800 [IU]/h via INTRAVENOUS
  Administered 2018-09-04: 900 [IU]/h via INTRAVENOUS
  Filled 2018-09-03 (×2): qty 250

## 2018-09-03 MED ORDER — STROKE: EARLY STAGES OF RECOVERY BOOK: Freq: Once | Status: DC

## 2018-09-03 MED ORDER — NITROGLYCERIN 2 % TD OINT
1.0000 [in_us] | TOPICAL_OINTMENT | Freq: Once | TRANSDERMAL | Status: DC
  Filled 2018-09-03: qty 30

## 2018-09-03 MED ORDER — CARBOXYMETHYLCELLULOSE SODIUM 1 % OP SOLN: 1.0000 [drp] | Freq: Two times a day (BID) | OPHTHALMIC | Status: DC | PRN

## 2018-09-03 MED ORDER — NITROGLYCERIN 2 % TD OINT
1.0000 [in_us] | TOPICAL_OINTMENT | Freq: Once | TRANSDERMAL | Status: AC
  Administered 2018-09-03: 1 [in_us] via TOPICAL
  Filled 2018-09-03: qty 1

## 2018-09-03 MED ORDER — LEVALBUTEROL HCL 0.63 MG/3ML IN NEBU: 0.6300 mg | INHALATION_SOLUTION | Freq: Four times a day (QID) | RESPIRATORY_TRACT | Status: DC | PRN

## 2018-09-03 MED ORDER — ACETAMINOPHEN 325 MG PO TABS: 650.0000 mg | ORAL_TABLET | Freq: Four times a day (QID) | ORAL | Status: DC | PRN

## 2018-09-03 MED ORDER — SODIUM CHLORIDE 0.9% FLUSH: 3.0000 mL | INTRAVENOUS | Status: DC | PRN

## 2018-09-03 MED ORDER — CHLORHEXIDINE GLUCONATE CLOTH 2 % EX PADS
6.0000 | MEDICATED_PAD | Freq: Every day | CUTANEOUS | Status: DC
  Administered 2018-09-03 – 2018-09-04 (×2): 6 via TOPICAL

## 2018-09-03 MED ORDER — ASPIRIN 81 MG PO CHEW: 81.0000 mg | CHEWABLE_TABLET | ORAL | Status: DC

## 2018-09-03 MED ORDER — ENOXAPARIN SODIUM 80 MG/0.8ML ~~LOC~~ SOLN: 1.0000 mg/kg | Freq: Two times a day (BID) | SUBCUTANEOUS | Status: DC

## 2018-09-03 MED ORDER — LORATADINE 10 MG PO TABS
10.0000 mg | ORAL_TABLET | Freq: Every day | ORAL | Status: DC
  Administered 2018-09-03 – 2018-09-06 (×2): 10 mg via ORAL
  Filled 2018-09-03 (×3): qty 1

## 2018-09-03 MED ORDER — POLYVINYL ALCOHOL 1.4 % OP SOLN
1.0000 [drp] | OPHTHALMIC | Status: DC | PRN
  Administered 2018-09-04: 1 [drp] via OPHTHALMIC
  Filled 2018-09-03: qty 15

## 2018-09-03 MED ORDER — METOPROLOL SUCCINATE ER 25 MG PO TB24
25.0000 mg | ORAL_TABLET | Freq: Every day | ORAL | Status: DC
  Administered 2018-09-03 – 2018-09-05 (×2): 25 mg via ORAL
  Filled 2018-09-03 (×3): qty 1

## 2018-09-03 MED ORDER — URSODIOL 300 MG PO CAPS
300.0000 mg | ORAL_CAPSULE | Freq: Two times a day (BID) | ORAL | Status: DC
  Administered 2018-09-03 – 2018-09-06 (×5): 300 mg via ORAL
  Filled 2018-09-03 (×8): qty 1

## 2018-09-03 MED ORDER — ASPIRIN EC 81 MG PO TBEC: 81.0000 mg | DELAYED_RELEASE_TABLET | Freq: Every day | ORAL | Status: DC

## 2018-09-03 MED ORDER — HALOPERIDOL LACTATE 5 MG/ML IJ SOLN
2.0000 mg | Freq: Four times a day (QID) | INTRAMUSCULAR | Status: DC | PRN
  Administered 2018-09-03 – 2018-09-05 (×2): 2 mg via INTRAVENOUS
  Filled 2018-09-03 (×2): qty 1

## 2018-09-03 MED ORDER — METOPROLOL TARTRATE 5 MG/5ML IV SOLN
2.5000 mg | Freq: Once | INTRAVENOUS | Status: AC
  Administered 2018-09-03: 2.5 mg via INTRAVENOUS
  Filled 2018-09-03: qty 5

## 2018-09-03 MED ORDER — ROSUVASTATIN CALCIUM 5 MG PO TABS: 10.0000 mg | ORAL_TABLET | Freq: Every day | ORAL | Status: DC

## 2018-09-03 MED ORDER — LOSARTAN POTASSIUM 25 MG PO TABS
25.0000 mg | ORAL_TABLET | Freq: Every day | ORAL | Status: DC
  Administered 2018-09-03 – 2018-09-05 (×2): 25 mg via ORAL
  Filled 2018-09-03 (×3): qty 1

## 2018-09-03 MED ORDER — ONDANSETRON HCL 4 MG PO TABS: 4.0000 mg | ORAL_TABLET | Freq: Four times a day (QID) | ORAL | Status: DC | PRN

## 2018-09-03 MED ORDER — MAGNESIUM CITRATE PO SOLN: 1.0000 | Freq: Once | ORAL | Status: DC | PRN

## 2018-09-03 MED ORDER — INSULIN ASPART 100 UNIT/ML ~~LOC~~ SOLN
0.0000 [IU] | Freq: Three times a day (TID) | SUBCUTANEOUS | Status: DC
  Administered 2018-09-03 (×2): 2 [IU] via SUBCUTANEOUS
  Administered 2018-09-04: 1 [IU] via SUBCUTANEOUS
  Administered 2018-09-05: 3 [IU] via SUBCUTANEOUS
  Administered 2018-09-05: 2 [IU] via SUBCUTANEOUS
  Administered 2018-09-06: 1 [IU] via SUBCUTANEOUS

## 2018-09-03 MED ORDER — FLUTICASONE PROPIONATE 50 MCG/ACT NA SUSP
2.0000 | Freq: Every day | NASAL | Status: DC
  Administered 2018-09-03 – 2018-09-06 (×3): 2 via NASAL
  Filled 2018-09-03 (×2): qty 16

## 2018-09-03 MED ORDER — ROSUVASTATIN CALCIUM 20 MG PO TABS: 20.0000 mg | ORAL_TABLET | Freq: Every day | ORAL | Status: DC

## 2018-09-03 MED ORDER — CLOPIDOGREL BISULFATE 75 MG PO TABS
75.0000 mg | ORAL_TABLET | Freq: Every day | ORAL | Status: DC
  Administered 2018-09-05: 75 mg via ORAL
  Filled 2018-09-03 (×2): qty 1

## 2018-09-03 MED ORDER — MAGNESIUM SULFATE 2 GM/50ML IV SOLN
2.0000 g | Freq: Once | INTRAVENOUS | Status: AC
  Administered 2018-09-03: 2 g via INTRAVENOUS
  Filled 2018-09-03: qty 50

## 2018-09-03 MED ORDER — HEPARIN SODIUM (PORCINE) 5000 UNIT/ML IJ SOLN: 5000.0000 [IU] | Freq: Three times a day (TID) | INTRAMUSCULAR | Status: DC

## 2018-09-03 MED ORDER — DOCUSATE SODIUM 100 MG PO CAPS
100.0000 mg | ORAL_CAPSULE | Freq: Two times a day (BID) | ORAL | Status: DC
  Administered 2018-09-03 – 2018-09-05 (×3): 100 mg via ORAL
  Filled 2018-09-03 (×5): qty 1

## 2018-09-03 NOTE — Progress Notes (Signed)
[place] for Heparin dosing Indication: chest pain/ACS  Allergies  Allergen Reactions  . Ivp Dye [Iodinated Diagnostic Agents] Anaphylaxis  . Sulfonamide Derivatives Shortness Of Breath  . Lipitor [Atorvastatin Calcium]     Muscle Aches  . Zetia [Ezetimibe]     Muscle Aches  . Penicillins Rash    As a teenager. Has patient had a PCN reaction causing immediate rash, facial/tongue/throat swelling, SOB or lightheadedness with hypotension: No Has patient had a PCN reaction causing severe rash involving mucus membranes or skin necrosis: No Has patient had a PCN reaction that required hospitalization: No Has patient had a PCN reaction occurring within the last 10 years: No If all of the above answers are "NO", then may proceed with Cephalosporin use.     Patient Measurements: Height: 5' 2"  (157.5 cm) Weight: 154 lb 5.2 oz (70 kg) IBW/kg (Calculated) : 50.1 Heparin Dosing Weight: 65 kg  Vital Signs: Temp: 97.6 F (36.4 C) (03/31 1949) Temp Source: Oral (03/31 1949) BP: 156/71 (03/31 2026) Pulse Rate: 94 (03/31 1900)  Labs: Recent Labs    09/03/18 0156 09/03/18 0341 09/03/18 0628 09/03/18 1303 09/03/18 2050 09/03/18 2148  HGB 14.0  --   --   --   --   --   HCT 43.6  --   --   --   --   --   PLT 250  --   --   --   --   --   APTT 33  --   --   --   --  64*  LABPROT 14.2  --   --   --   --   --   INR 1.1  --   --   --   --   --   HEPARINUNFRC  --   --   --   --  1.24*  --   CREATININE 0.66  --   --   --   --   --   TROPONINI 0.43* 0.28* 0.48* 1.90*  --   --     Estimated Creatinine Clearance: 44.6 mL/min (by C-G formula based on SCr of 0.66 mg/dL).   Medical History: Past Medical History:  Diagnosis Date  . Allergic rhinitis   . Arthritis    "probably in my hands" (02/26/2018)  . Colonic polyp   . Coronary artery disease    a. s/p cath on 02/26/2018 with 3-vessel CAD with 95% proximal LAD, 90% distal RCA, and 70% OM1  stenosis.  She underwent successful PCI with DES x1 to the distal RCA and DES x1 to the proximal LAD  . GERD (gastroesophageal reflux disease)   . History of blood transfusion    "don't remember why or when" (02/26/2018)  . Hyperlipidemia   . Hypertension   . Nonalcoholic hepatosteatosis   . TIA (transient ischemic attack) 2000s   "I've had ~ 3" (02/26/2018)  . Type II diabetes mellitus (HCC)     Medications:  Scheduled:  . [START ON 09/04/2018] aspirin  81 mg Oral Pre-Cath  . aspirin EC  81 mg Oral Daily  . Chlorhexidine Gluconate Cloth  6 each Topical Daily  . [START ON 09/04/2018] clopidogrel  75 mg Oral Q breakfast  . docusate sodium  100 mg Oral BID  . fluticasone  2 spray Each Nare Daily  . insulin aspart  0-9 Units Subcutaneous TID WC  . loratadine  10 mg Oral Daily  . losartan  25 mg Oral  Daily  . metoprolol succinate  25 mg Oral Daily  . nitroGLYCERIN  1 inch Topical Once  . pantoprazole  40 mg Oral Daily  . rosuvastatin  10 mg Oral q1800  . sodium chloride flush  3 mL Intravenous Q12H  . ursodiol  300 mg Oral BID   Infusions:  . sodium chloride    . [START ON 09/04/2018] sodium chloride     Followed by  . [START ON 09/04/2018] sodium chloride    . heparin 800 Units/hr (09/03/18 1800)   PRN: sodium chloride, acetaminophen **OR** acetaminophen, albuterol, haloperidol lactate, HYDROcodone-acetaminophen, ketorolac, levalbuterol, magnesium citrate, morphine injection, ondansetron **OR** ondansetron (ZOFRAN) IV, polyvinyl alcohol, senna-docusate, sodium chloride flush, sorbitol, traZODone  Assessment: Patient admitted with complaint of transient left arm pain. Hx of CVA with left sided weakness. Troponin 0.35>0.48>1.9. On apixaban PTA - LD unknown.  Heparin is running in the R wrist, level was drawn from L AC. Received bolus and infusion earlier today. Initial heparin level came back at 1.24 - likely falsely elevated due to apixaban. Ordered aPTT given possible recent last dose.  Will monitor both concurrently until correlate.   3/31 PM update: aPTT is just below goal at 64  Goal of Therapy:  APTT: 66-102 s Heparin level 0.3-0.7 units/ml Monitor platelets by anticoagulation protocol: Yes   Plan:  Inc heparin to 900 units/hr Re-check heparin level and aPTT with AM labs Continue to monitor H&H and platelets  Narda Bonds, PharmD, BCPS Clinical Pharmacist Phone: [phone]

## 2018-09-03 NOTE — Progress Notes (Signed)
Notified PA Dunn caridology and neuro on call patient arrived.

## 2018-09-03 NOTE — ED Provider Notes (Signed)
[facility] EMERGENCY DEPARTMENT Provider Note   CSN: [id number] Arrival date & time: 09/03/18  0124  Time seen 1:45 AM  History   Chief Complaint Left upper extremity pain  Level 5 caveat for age  HPI [name] is a 85 y.o. female.     HPI patient states tonight she started having pain in her left elbow that radiated into her hand.  She states the pain went into her left index finger and her right middle finger however the pain was worse in the index finger.  She denies any neck pain.  She initially told me she is never had this pain before but later she told me she has had pain in this arm before but it normally did not involve her finger.  Family gave her 1 nitroglycerin at home and the pain improved.  She denies chest pain, cough, fever but does states she is short of breath.  EMS reports she seemed short of breath on their arrival and they thought she had some wheezing.  She states she quit smoking 10 years ago and she has had to use inhalers in the past.  Patient states she had a stroke and she is normally weak on her left side.  However she states today she feels like her left upper extremity is weaker than her baseline.  She denies feeling short of breath although she appears to be in some respiratory distress.  She states she has had to have a nebulizer in the past.  PCP Loman Brooklyn, FNP   Past Medical History:  Diagnosis Date  . Allergic rhinitis   . Arthritis    "probably in my hands" (02/26/2018)  . Colonic polyp   . Coronary artery disease    a. s/p cath on 02/26/2018 with 3-vessel CAD with 95% proximal LAD, 90% distal RCA, and 70% OM1 stenosis.  She underwent successful PCI with DES x1 to the distal RCA and DES x1 to the proximal LAD  . GERD (gastroesophageal reflux disease)   . History of blood transfusion    "don't remember why or when" (02/26/2018)  . Hyperlipidemia   . Hypertension   . Nonalcoholic hepatosteatosis   . TIA (transient ischemic attack) 2000s    "I've had ~ 3" (02/26/2018)  . Type II diabetes mellitus [place])     Patient Active Problem List   Diagnosis Date Noted  . Arm pain, anterior, left 09/03/2018  . Acute left-sided weakness 05/07/2018  . Nonalcoholic hepatosteatosis 56/38/9373  . Acute CVA (cerebrovascular accident) ([place]) 05/07/2018  . Weakness 05/07/2018  . Unstable angina ([place]) 02/26/2018  . TIA (transient ischemic attack) 02/07/2011  . PFO (patent foramen ovale) 02/07/2011  . Type II diabetes mellitus ([place]) 09/30/2008  . CHANGE IN BOWELS 09/30/2008  . HYPERCHOLESTEROLEMIA 09/29/2008  . Essential hypertension 09/29/2008  . Coronary atherosclerosis 09/29/2008  . VENTRICULAR HYPERTROPHY, LEFT 09/29/2008  . ALLERGIC RHINITIS, CHRONIC 09/29/2008  . OTHER CHRONIC NONALCOHOLIC LIVER DISEASE 42/87/6811  . LIVER FUNCTION TESTS, ABNORMAL, HX OF 09/29/2008  . COLONIC POLYPS, HX OF 09/29/2008  . GERD (gastroesophageal reflux disease) 09/29/2008    Past Surgical History:  Procedure Laterality Date  . ABDOMINAL HYSTERECTOMY    . APPENDECTOMY  1950s  . CARDIAC CATHETERIZATION  2001   Nonobstructive CAD  . CATARACT EXTRACTION W/ INTRAOCULAR LENS  IMPLANT, BILATERAL Bilateral 2000s  . CORONARY STENT INTERVENTION N/A 02/26/2018   Procedure: CORONARY STENT INTERVENTION;  Surgeon: Martinique, Peter M, MD;  Location: [place] [facility];  Service: Cardiovascular;  Laterality: N/A;  rca   . LEFT HEART CATH AND CORONARY ANGIOGRAPHY N/A 02/26/2018   Procedure: LEFT HEART CATH AND CORONARY ANGIOGRAPHY;  Surgeon: Martinique, Peter M, MD;  Location: [place] [facility];  Service: Cardiovascular;  Laterality: N/A;  . TONSILLECTOMY  1943     OB History   No obstetric history on file.      Home Medications    Prior to Admission medications   Medication Sig Start Date End Date Taking? Authorizing Provider  aspirin EC 81 MG EC tablet Take 1 tablet (81 mg total) by mouth daily. 05/11/18  Yes Johnson, Clanford L, MD  carboxymethylcellulose  (REFRESH) 1 % ophthalmic solution Place 1 drop into both eyes 2 (two) times daily as needed (dry eyes).    Yes [provider]  clopidogrel (PLAVIX) 75 MG tablet Take 1 tablet (75 mg total) by mouth daily with breakfast. 04/02/18  Yes Strader, [place] M, PA-C  docusate sodium (COLACE) 100 MG capsule Take 200 mg by mouth 2 (two) times daily as needed for mild constipation.    Yes [provider]  losartan (COZAAR) 25 MG tablet Take 1 tablet (25 mg total) by mouth daily. 05/10/18  Yes Johnson, Clanford L, MD  metFORMIN (GLUCOPHAGE-XR) 500 MG 24 hr tablet Take 1 tablet (500 mg total) by mouth 2 (two) times daily with a meal. 05/10/18  Yes Johnson, Clanford L, MD  metoprolol succinate (TOPROL-XL) 25 MG 24 hr tablet Take 1 tablet (25 mg total) by mouth daily. 04/02/18  Yes Strader, [place], PA-C  Multiple Vitamin (MULTIVITAMIN) tablet Take 1 tablet by mouth daily.     Yes [provider]  nitroGLYCERIN (NITROSTAT) 0.4 MG SL tablet Place 0.4 mg under the tongue every 5 (five) minutes as needed for chest pain.   Yes de Stanford Scotland, MD  pantoprazole (PROTONIX) 40 MG tablet Take 1 tablet (40 mg total) by mouth daily. 04/02/18  Yes Strader, [place], PA-C  fexofenadine (ALLEGRA) 180 MG tablet Take 180 mg by mouth daily.      [provider]  fluticasone (FLONASE) 50 MCG/ACT nasal spray Place 2 sprays into both nostrils daily.     [provider]  rosuvastatin (CRESTOR) 5 MG tablet Take 1 tablet (5 mg total) by mouth daily at 6 PM. 05/10/18 08/08/18  Johnson, Clanford L, MD  ursodiol (ACTIGALL) 300 MG capsule Take 300 mg by mouth 2 (two) times daily.     [provider]  vitamin E (VITAMIN E) 400 UNIT capsule Take 400 Units by mouth 2 (two) times daily.     [provider]    Family History Family History  Problem Relation Age of Onset  . Heart failure Mother   . Prostate cancer Father   . Prostate cancer Brother     Social History Social  History   Tobacco Use  . Smoking status: Former Smoker    Packs/day: 2.00    Years: 40.00    Pack years: 80.00    Types: Cigarettes    Last attempt to quit: 06/05/1992    Years since quitting: 26.2  . Smokeless tobacco: Former Systems developer    Types: Chew    Quit date: 12/03/1992  . Tobacco comment: used tobacco to help quit smoking  Substance Use Topics  . Alcohol use: Never    Frequency: Never  . Drug use: Never  lives at home  Lives with spouse Uses a walker   Allergies   Ivp  dye [iodinated diagnostic agents]; Sulfonamide derivatives; Lipitor [atorvastatin calcium]; Zetia [ezetimibe]; and Penicillins   Review of Systems Review of Systems  All other systems reviewed and are negative.    Physical Exam Updated Vital Signs BP (!) 190/102   Pulse 88   Temp (!) 97.5 F (36.4 C) (Oral)   Resp 13   Ht 5' 2"  (1.575 m)   Wt 70.8 kg   SpO2 95%   BMI 28.53 kg/m   Vital signs normal except hypertension   Physical Exam Vitals signs and nursing note reviewed.  Constitutional:      General: She is in acute distress.     Appearance: Normal appearance.  HENT:     Head: Normocephalic and atraumatic.     Right Ear: External ear normal.     Left Ear: External ear normal.     Nose: Nose normal.     Mouth/Throat:     Mouth: Mucous membranes are moist.     Pharynx: Oropharynx is clear. No oropharyngeal exudate or posterior oropharyngeal erythema.  Eyes:     Extraocular Movements: Extraocular movements intact.     Conjunctiva/sclera: Conjunctivae normal.     Pupils: Pupils are equal, round, and reactive to light.  Neck:     Musculoskeletal: Normal range of motion.  Cardiovascular:     Rate and Rhythm: Normal rate and regular rhythm.     Pulses: Normal pulses.  Pulmonary:     Effort: Tachypnea, accessory muscle usage, prolonged expiration and respiratory distress present.     Breath sounds: Decreased breath sounds present.  Musculoskeletal:        General: No swelling or  deformity.  Skin:    General: Skin is warm and dry.     Findings: No erythema or rash.  Neurological:     Mental Status: She is alert.     Cranial Nerves: No cranial nerve deficit.     Comments: Patient's grips are equal bilaterally.  She however has pronator drift on the left and has difficulty getting her left upper extremity extended.  Her straight leg raising is good bilaterally but appears to have more effort to do it on the left side.  Psychiatric:        Mood and Affect: Mood normal.        Behavior: Behavior normal.        Thought Content: Thought content normal.      ED Treatments / Results  Labs (all labs ordered are listed, but only abnormal results are displayed) Results for orders placed or performed during the hospital encounter of 09/03/18  Comprehensive metabolic panel  Result Value Ref Range   Sodium 141 135 - 145 mmol/L   Potassium 4.2 3.5 - 5.1 mmol/L   Chloride 101 98 - 111 mmol/L   CO2 29 22 - 32 mmol/L   Glucose, Bld 214 (H) 70 - 99 mg/dL   BUN 15 8 - 23 mg/dL   Creatinine, Ser 0.66 0.44 - 1.00 mg/dL   Calcium 9.9 8.9 - 10.3 mg/dL   Total Protein 7.4 6.5 - 8.1 g/dL   Albumin 4.0 3.5 - 5.0 g/dL   AST 20 15 - 41 U/L   ALT 11 0 - 44 U/L   Alkaline Phosphatase 136 (H) 38 - 126 U/L   Total Bilirubin 0.4 0.3 - 1.2 mg/dL   GFR calc non Af Amer >60 >60 mL/min   GFR calc Af Amer >60 >60 mL/min   Anion gap 11 5 - 15  Troponin I - Once  Result Value Ref Range   Troponin I 0.43 (HH) <0.03 ng/mL  CBC with Differential  Result Value Ref Range   WBC 11.5 (H) 4.0 - 10.5 K/uL   RBC 4.57 3.87 - 5.11 MIL/uL   Hemoglobin 14.0 12.0 - 15.0 g/dL   HCT 43.6 36.0 - 46.0 %   MCV 95.4 80.0 - 100.0 fL   MCH 30.6 26.0 - 34.0 pg   MCHC 32.1 30.0 - 36.0 g/dL   RDW 12.9 11.5 - 15.5 %   Platelets 250 150 - 400 K/uL   nRBC 0.0 0.0 - 0.2 %   Neutrophils Relative % 75 %   Neutro Abs 8.5 (H) 1.7 - 7.7 K/uL   Lymphocytes Relative 17 %   Lymphs Abs 2.0 0.7 - 4.0 K/uL    Monocytes Relative 7 %   Monocytes Absolute 0.8 0.1 - 1.0 K/uL   Eosinophils Relative 1 %   Eosinophils Absolute 0.1 0.0 - 0.5 K/uL   Basophils Relative 0 %   Basophils Absolute 0.1 0.0 - 0.1 K/uL   Immature Granulocytes 0 %   Abs Immature Granulocytes 0.04 0.00 - 0.07 K/uL  Brain natriuretic peptide  Result Value Ref Range   B Natriuretic Peptide 463.0 (H) 0.0 - 100.0 pg/mL  D-dimer, quantitative  Result Value Ref Range   D-Dimer, Quant 0.35 0.00 - 0.50 ug/mL-FEU  Protime-INR  Result Value Ref Range   Prothrombin Time 14.2 11.4 - 15.2 seconds   INR 1.1 0.8 - 1.2  APTT  Result Value Ref Range   aPTT 33 24 - 36 seconds  Troponin I - Once  Result Value Ref Range   Troponin I 0.28 (HH) <0.03 ng/mL  Calcium  Result Value Ref Range   Calcium 9.5 8.9 - 10.3 mg/dL  Magnesium  Result Value Ref Range   Magnesium 1.4 (L) 1.7 - 2.4 mg/dL  Phosphorus  Result Value Ref Range   Phosphorus 3.6 2.5 - 4.6 mg/dL  Brain natriuretic peptide  Result Value Ref Range   B Natriuretic Peptide 415.0 (H) 0.0 - 100.0 pg/mL  TSH  Result Value Ref Range   TSH 2.120 0.350 - 4.500 uIU/mL  Troponin I - Once  Result Value Ref Range   Troponin I 0.48 (HH) <0.03 ng/mL  CBG monitoring, ED  Result Value Ref Range   Glucose-Capillary 181 (H) 70 - 99 mg/dL   Laboratory interpretation all normal except hyperglycemia, positive troponin, mildly elevated BNP, leukocytosis, + Troponin    EKG  #3  EKG Interpretation  Date/Time:  Tuesday September 03 2018 07:07:12 EDT Ventricular Rate:  77 PR Interval:    QRS Duration: 106 QT Interval:  436 QTC Calculation: 494 R Axis:   -35 Text Interpretation:  Sinus rhythm Probable left atrial enlargement Left axis deviation Nonspecific repol abnormality, diffuse leads Borderline prolonged QT interval Since last tracing 1 hr ago T wave inversion less evident in Inferolateral leads Confirmed by Rolland Porter [phone]) on 09/03/2018 7:14:35 AM         #2   EKG  Interpretation  Date/Time:  Tuesday September 03 2018 06:06:15 EDT Ventricular Rate:  50 PR Interval:    QRS Duration: 108 QT Interval:  510 QTC Calculation: 466 R Axis:   -40 Text Interpretation:  Sinus rhythm Atrial premature complex Probable left atrial enlargement Abnormal R-wave progression, late transition LVH with secondary repolarization abnormality Since last tracing of earlier today T wave inversion now evident in Inferolateral leads Confirmed by Rolland Porter (  [id number]) on 09/03/2018 6:10:10 AM       #1  EKG Interpretation  Date/Time:  Tuesday September 03 2018 01:31:45 EDT Ventricular Rate:  77 PR Interval:    QRS Duration: 107 QT Interval:  408 QTC Calculation: 462 R Axis:   -34 Text Interpretation:  Sinus rhythm Left axis deviation Borderline repol abnrm, anterolateral leads Baseline wander in lead(s) V3 No significant change since last tracing 07 May 2018 Confirmed by Rolland Porter [phone]) on 09/03/2018 1:38:50 AM   Radiology Dg Chest 2 View  Result Date: 09/03/2018 CLINICAL DATA:  LEFT arm pain. EXAM: CHEST - 2 VIEW COMPARISON:  Chest radiograph June 04, 2013 FINDINGS: Cardiac silhouette is mildly enlarged. Mediastinal silhouette is normal. Mildly calcified aortic arch. Mild chronic interstitial changes. No pleural effusions or focal consolidations. Trachea projects midline and there is no pneumothorax. Soft tissue planes and included osseous structures are non-suspicious. IMPRESSION: 1. Mild cardiomegaly. No acute pulmonary process. 2. Aortic Atherosclerosis (ICD10-I70.0). Electronically Signed   By: Elon Alas M.D.   On: 09/03/2018 02:32   Ct Head Wo Contrast  Result Date: 09/03/2018 CLINICAL DATA:  LEFT arm pain since yesterday. History of stroke and LEFT-sided weakness. EXAM: CT HEAD WITHOUT CONTRAST TECHNIQUE: Contiguous axial images were obtained from the base of the skull through the vertex without intravenous contrast. COMPARISON:  CT HEAD May 31, 2018  FINDINGS: Mild motion degraded examination. BRAIN: No intraparenchymal hemorrhage, mass effect nor midline shift. No parenchymal brain volume loss for age. No hydrocephalus. Confluent supratentorial white matter hypodensities. Old basal ganglia and thalami infarcts. No acute large vascular territory infarcts. No abnormal extra-axial fluid collections. Basal cisterns are patent. VASCULAR: Moderate calcific atherosclerosis of the carotid siphons. SKULL: No skull fracture. Osteopenia. No significant scalp soft tissue swelling. SINUSES/ORBITS: Maxillary mucosal retention cyst. Small RIGHT mastoid effusion.The included ocular globes and orbital contents are non-suspicious. Status post bilateral ocular lens implants. OTHER: None. IMPRESSION: 1. No acute intracranial process. 2. Stable examination including severe chronic small vessel ischemic changes and old lacunar infarcts. Electronically Signed   By: Elon Alas M.D.   On: 09/03/2018 02:16    Procedures .Critical Care Performed by: Rolland Porter, MD Authorized by: Rolland Porter, MD   Critical care provider statement:    Critical care time (minutes):  40   Critical care was necessary to treat or prevent imminent or life-threatening deterioration of the following conditions:  CNS failure or compromise and cardiac failure   Critical care was time spent personally by me on the following activities:  Discussions with consultants, evaluation of patient's response to treatment, examination of patient, obtaining history from patient or surrogate, ordering and review of laboratory studies, ordering and review of radiographic studies, pulse oximetry, re-evaluation of patient's condition and review of old charts   (including critical care time)  Medications Ordered in ED Medications  albuterol (PROVENTIL) (2.5 MG/3ML) 0.083% nebulizer solution 5 mg (5 mg Nebulization Given 09/03/18 0222)  nitroGLYCERIN (NITROGLYN) 2 % ointment 1 inch (1 inch Topical Given 09/03/18  0250)  Heparin bolus and drip per pharmacy   Initial Impression / Assessment and Plan / ED Course  I have reviewed the triage vital signs and the nursing notes.  Pertinent labs & imaging results that were available during my care of the patient were reviewed by me and considered in my medical decision making (see chart for details).    Laboratory testing was done to evaluate her for her complaints of left arm pain including EKG, troponin.  CT  head was done to look for recurrent stroke or bleeding.    2:50 AM nurse reports her blood pressure still elevated.  She had gotten relief of her pain earlier with nitroglycerin.  Nitroglycerin ointment was placed on her chest.  Recheck at 3 AM patient's labs have resulted.  When I talked to the patient she remains pain-free.  She denies any chest pain.  Her breathing is much improved from when I first saw her.  We discussed she would need a repeat troponin and she is agreeable.  If that is negative I will consider talking to tele-neurology.  4:10 AM tele-neurology evaluation is going on at this time.  5:28 AM Dr. Roanna Raider, tele-neurologist called me and states she does not feel like patient needs to be admitted for a neurological evaluation at this time.  If her symptoms return she recommends MRI of the cervical spine, emg/ncs as outpatient. Also recommends outpatient memory evaluation.  5:47 AM Dr. Tindall Lions, cardiologist on call.  We discussed that she is s/p cath on 02/26/2018 with 3-vessel CAD with 95% proximal LAD, 90% distal RCA, and 70% OM1 stenosis.  She underwent successful PCI with DES x1 to the distal RCA and DES x1 to the proximal LAD, she then had a stroke in December of left-sided weakness.  When she came to the ED her blood pressure did get as high as 227/125.  She was given nitroglycerin paste and it is currently in the 173-187 range.  He recommends that she stay here to be admitted and see the cardiologist here.  She has seen Dr.  Bronson Ing in the past.  He feels like she needs to have better blood pressure control before she goes home.  I will talk to the hospitalist about admission.  Dr Roger Shelter 06:03, hospitalist, will admit.   6:05 AM nurses report patient's blood pressure is now 250 systolic and her heart rate is 44.  Repeat EKG is being done.  Repeat EKG shows her rhythm has gotten much slower and she now has inverted T waves in her inferior lateral leads.  I am going to speak to cardiology again.  6:18 AM Dr. Stanford Breed, cardiologist, states he is not at a computer.  However based on how I described the EKG, and the patient is denying any symptoms and states she feels fine he recommends starting her on IV heparin and getting another troponin and having cardiology see her to see if she needs to come down to [facility] at some point today for cardiac cath.  He feels like she should be started on heparin.  6:20 AM I spoke to Dr. Roger Shelter who was in the patient room to update him on what has been happening.  Patient's current heart rate is 72 and her blood pressure is 141/69.  Patient denies nausea, chest pain, back pain, shortness of breath, or arm pain.  He was advised of what had happened since I talked to him and the conversation with Dr. Stanford Breed.  We have decided to keep patient in the ED and have cardiology see her in the ED and decide if she needs to go down to [facility] today for cardiac cath or if she can stay at [facility]   Consult to pharmacy was made for heparin bolus and drip.  6:30 AM lab was in the room to draw her morning blood work and a troponin was added.  06:50 AM nurses were going to take patient to ICU, made aware she is staying in the  ED until cardiology sees her.   07:05 AM 3rd troponin is back up to 0.48, from 0.28 and 0.43  3rd EKG ordered. Pt remains asymptomatic.   07:05 AM Dr Laverta Baltimore, will talk to cardiologist when they arrive in their office today.   Final Clinical Impressions(s) / ED  Diagnoses   Final diagnoses:  Left arm pain  Essential hypertension  Elevated troponin  Hypertensive urgency  Abnormal EKG    Plan admission  Rolland Porter, MD, Barbette Or, MD 09/03/18 [phone]

## 2018-09-03 NOTE — Progress Notes (Signed)
Spoke with D. Dunn, notified BP 173/78, states cardiology will defer to neurology on BP, advised her Dr. Charna Archer paged.  If Dr. Charna Archer does not come or call back will page night shift on call neuro MD.  Will pass on to night shift RN.

## 2018-09-03 NOTE — ED Notes (Signed)
[place] at bedside now.

## 2018-09-03 NOTE — ED Notes (Signed)
Date and time results received: 09/03/18 0445  Test: Troponin Critical Value: 0.28  Name of Provider Notified: MD Tomi Bamberger  Orders Received? Or Actions Taken?: Acknowledged.

## 2018-09-03 NOTE — ED Triage Notes (Signed)
EMS from home c/o Left arm pain that started yesterday evening. Hx of CVA with Left sided weakness. Family gave pt nitro at home that relieved the pain and it has not returned.

## 2018-09-03 NOTE — Progress Notes (Signed)
Patient arrived via EMS at 1830hrs. Confused and oriented to self only.  Bed alarm on.

## 2018-09-03 NOTE — Progress Notes (Addendum)
ANTICOAGULATION CONSULT NOTE - Initial Consult  Pharmacy Consult for heparin dosing Indication: chest pain/ACS  Allergies  Allergen Reactions  . Ivp Dye [Iodinated Diagnostic Agents] Anaphylaxis  . Sulfonamide Derivatives Shortness Of Breath  . Lipitor [Atorvastatin Calcium]     Muscle Aches  . Zetia [Ezetimibe]     Muscle Aches  . Penicillins Rash    As a teenager. Has patient had a PCN reaction causing immediate rash, facial/tongue/throat swelling, SOB or lightheadedness with hypotension: No Has patient had a PCN reaction causing severe rash involving mucus membranes or skin necrosis: No Has patient had a PCN reaction that required hospitalization: No Has patient had a PCN reaction occurring within the last 10 years: No If all of the above answers are "NO", then may proceed with Cephalosporin use.     Patient Measurements: Height: 5' 2"  (157.5 cm) Weight: 156 lb (70.8 kg) IBW/kg (Calculated) : 50.1 Heparin Dosing Weight: 65 kg  Vital Signs: Temp: 97.5 F (36.4 C) (03/31 0133) Temp Source: Oral (03/31 0133) BP: 141/69 (03/31 0605) Pulse Rate: 52 (03/31 0605)  Labs: Recent Labs    09/03/18 0156 09/03/18 0341  HGB 14.0  --   HCT 43.6  --   PLT 250  --   APTT 33  --   LABPROT 14.2  --   INR 1.1  --   CREATININE 0.66  --   TROPONINI 0.43* 0.28*    Estimated Creatinine Clearance: 44.8 mL/min (by C-G formula based on SCr of 0.66 mg/dL).   Medical History: Past Medical History:  Diagnosis Date  . Allergic rhinitis   . Arthritis    "probably in my hands" (02/26/2018)  . Colonic polyp   . Coronary artery disease    a. s/p cath on 02/26/2018 with 3-vessel CAD with 95% proximal LAD, 90% distal RCA, and 70% OM1 stenosis.  She underwent successful PCI with DES x1 to the distal RCA and DES x1 to the proximal LAD  . GERD (gastroesophageal reflux disease)   . History of blood transfusion    "don't remember why or when" (02/26/2018)  . Hyperlipidemia   . Hypertension    . Nonalcoholic hepatosteatosis   . TIA (transient ischemic attack) 2000s   "I've had ~ 3" (02/26/2018)  . Type II diabetes mellitus (HCC)     Medications:  Scheduled:  . aspirin EC  81 mg Oral Daily  . docusate sodium  100 mg Oral BID  . heparin  3,900 Units Intravenous Once  . insulin aspart  0-9 Units Subcutaneous TID WC  . nitroGLYCERIN  1 inch Topical Once   Infusions:  . sodium chloride    . heparin     PRN: acetaminophen **OR** acetaminophen, albuterol, HYDROcodone-acetaminophen, ketorolac, levalbuterol, magnesium citrate, morphine injection, ondansetron **OR** ondansetron (ZOFRAN) IV, senna-docusate, sorbitol, traZODone  Assessment: Patient admitted with complaint of transient left arm pain. Hx of CVA with left sided weakness. Troponin noted to be 0.43 ng/ml & 0.28 ng/ml in ED.  Goal of Therapy:  Heparin level 0.3-0.7 units/ml Monitor platelets by anticoagulation protocol: Yes   Plan:  Give 3900 units bolus x 1 Start heparin infusion at 800 units/hr Check anti-Xa level in 8 hours and daily while on heparin Continue to monitor H&H and platelets  Wyline Mood 09/03/2018,6:54 AM

## 2018-09-03 NOTE — Consult Note (Addendum)
Neurology Consultation Reason for Consult: Stroke Referring Physician: Guillermina City, D  CC: Dysarthria  History is obtained from: Patient, chart review  HPI: [name] is a 85 y.o. female with a history of hypertension, hyperlipidemia, TIAs who presents with left-sided arm pain that resolved with nitroglycerin as well as dysarthria.  Because of the dysarthria which was transient, an MRI was obtained which shows chronic appearing left temporal infarct with small acute ischemia superimposed on it.  She states that she feels like her speech is back to relatively normal now.  She has left-sided weakness which she states has been present for quite some time, though she is not sure exactly how long.  LKW: Unclear tpa given?: no, unclear time of onset   ROS: A 14 point ROS was performed and is negative except as noted in the HPI.   Past Medical History:  Diagnosis Date  . Allergic rhinitis   . Arthritis    "probably in my hands" (02/26/2018)  . Colonic polyp   . Coronary artery disease    a. s/p cath on 02/26/2018 with 3-vessel CAD with 95% proximal LAD, 90% distal RCA, and 70% OM1 stenosis.  She underwent successful PCI with DES x1 to the distal RCA and DES x1 to the proximal LAD  . GERD (gastroesophageal reflux disease)   . History of blood transfusion    "don't remember why or when" (02/26/2018)  . Hyperlipidemia   . Hypertension   . Nonalcoholic hepatosteatosis   . TIA (transient ischemic attack) 2000s   "I've had ~ 3" (02/26/2018)  . Type II diabetes mellitus (HCC)      Family History  Problem Relation Age of Onset  . Heart failure Mother   . Prostate cancer Father   . Prostate cancer Brother      Social History:  reports that she quit smoking about 26 years ago. Her smoking use included cigarettes. She has a 80.00 pack-year smoking history. She quit smokeless tobacco use about 25 years ago.  Her smokeless tobacco use included chew. She reports that she does not drink alcohol or  use drugs.   Exam: Current vital signs: BP (!) 156/71   Pulse 94   Temp 97.6 F (36.4 C) (Oral)   Resp (!) 25   Ht 5' 2"  (1.575 m)   Wt 70 kg   SpO2 93%   BMI 28.23 kg/m  Vital signs in last 24 hours: Temp:  [97.5 F (36.4 C)-98.2 F (36.8 C)] 97.6 F (36.4 C) (03/31 1949) Pulse Rate:  [52-94] 94 (03/31 1900) Resp:  [13-35] 25 (03/31 1705) BP: (141-227)/(66-128) 156/71 (03/31 2026) SpO2:  [92 %-98 %] 93 % (03/31 1834) Weight:  [70 kg-70.8 kg] 70 kg (03/31 1705)   Physical Exam  Constitutional: Appears well-developed and well-nourished.  Psych: Affect appropriate to situation Eyes: No scleral injection HENT: No OP obstrucion Head: Normocephalic.  Cardiovascular: Normal rate and regular rhythm.  Respiratory: Effort normal, non-labored breathing GI: Soft.  No distension. There is no tenderness.  Skin: WDI  Neuro: Mental Status: Patient is awake, alert, oriented to year, but gives the month is January and is not certain of the name of the hospital.. Patient is able to give a clear and coherent history. No signs of aphasia or neglect Cranial Nerves: II: Visual Fields are full. Pupils are equal, round, and reactive to light.   III,IV, VI: EOMI without ptosis or diploplia.  V: Facial sensation is symmetric to temperature VII: Facial movement is slightly weak on  the left VIII: hearing is intact to voice X: Uvula elevates symmetrically XI: Shoulder shrug is symmetric. XII: tongue is midline without atrophy or fasciculations.  Motor: Tone is normal. Bulk is normal. 5/5 strength was present on the right, she has 4/5 weakness of the left arm and leg Sensory: Sensation is diminished on the left Cerebellar: No clear ataxia     I have reviewed labs in epic and the results pertinent to this consultation are: Mild leukocytosis  I have reviewed the images obtained: MRI brain -multifocal old areas of infarct, there is a chronic infarct in the left temporal region with  small diffusion abnormality adjacent to it.  Impression: 85 year old female with multiple old predominantly white matter infarcts with a history of atrial fibrillation, not currently on anticoagulation.  She has diffusion positivity on her MRI adjacent to a chronic appearing infarct which I suspect does represent acute on chronic ischemia.  Though slowly for stroke prevention, we do not typically recommend acute anticoagulation, this infarct is very small and I feel represents a very low likelihood of bleeding, and she has concern for acute coronary syndrome as well as secondary stroke prevention and therefore I do think that anticoagulation is appropriate.  I suspect that her left-sided weakness is chronic, and unrelated to her current infarct.  She is mildly confused, and I would recommend checking a urinalysis as well in the setting of leukocytosis.  I suspect that she has some underlying dementia.  Recommendations: -LDL 143, recommend increasing Crestor dose - Frequent neuro checks - Echocardiogram per cardiology - Carotid dopplers - Prophylactic therapy-consider anticoagulation given history of atrial fibrillation and stroke. - PT consult, OT consult, Speech consult -N.p.o. pending stroke swallow screen - Stroke team to follow     Roland Rack, MD [facility] [phone]  If 7pm- 7am, please page neurology on call as listed in [place].

## 2018-09-03 NOTE — Consult Note (Addendum)
Cardiology Consult    Patient ID: EMMA BIRCHLER; [id number]; [birth year]   Admit date: 09/03/2018 Date of Consult: 09/03/2018  Primary Care Provider: Loman Brooklyn, FNP Primary Cardiologist: Kate Sable, MD   Patient Profile    AMA MCMASTER is a 85 y.o. female with past medical history of CAD (s/p DES to distal RCA and DES to proximal LAD in 02/2018), moderate AS, HTN, HLD, Type 2 DM, and CVA (in 05/2018, with Coumadin for prior TIA's being discontinued by Neurology and continued on Plavix and ASA) who is being seen today for the evaluation of elevated troponin values at the request of Dr. Carles Collet.   History of Present Illness    Ms. Sutherland was last examined by Dr. Bronson Ing in 05/2018 and was residing at the [facility] at that time as she was undergoing physical therapy for recovery from her recent CVA. Denied any recent cardiac issues at that time. A 30-day monitor was in place for her recent CVA and of concern is that this was resulted and showed episodes of SVT and paroxysmal atrial fibrillation with rates up to 151 bpm but in the notes sent to nursing staff, it mentioned switching from Coumadin to Eliquis due to concerns for subtherapeutic INR but she was not taking Coumadin at that time and notes were forwarded to Neurology.   She presented to [facility] ED during the early morning hours of 09/03/2018 for evaluation of left arm pain. The pain radiated from her elbow into her fingers. Reported associated dyspnea but denied any chest pain. Did take SL NTG with improvement in her arm pain. No recent orthopnea, PND, or lower extremity edema.   Initial BP was at 180/98, peaking at 227/125. A NTG patch was placed with improvement in her readings but still elevated (174/98 on most recent check). Labs show WBC 11.5, Hgb 14.0, platelets 250, Na+ 141, K+ 4.2, and creatinine 0.66. BNP 463. D-dimer negative. Initial troponin 0.43 with repeat values of 0.28 and 0.48. FLP shows total cholesterol of  227, HDL 55, Triglycerides 146, and LDL 143. Initial EKG showed NSR, HR 77, with LAD and nonspecific ST abnormality along inferior leads. Repeat tracing at 0600 showed more significant TWI along inferolateral leads. CXR shows mild cardiomegaly and aortic atherosclerosis with no acute findings. CT Head shows no acute findings but noted to have severe chronic small vessel ischemic changes and old lacunar infarcts.   She has been admitted and started on IV Heparin. Has been NPO since admission.    Past Medical History:  Diagnosis Date  . Allergic rhinitis   . Arthritis    "probably in my hands" (02/26/2018)  . Colonic polyp   . Coronary artery disease    a. s/p cath on 02/26/2018 with 3-vessel CAD with 95% proximal LAD, 90% distal RCA, and 70% OM1 stenosis.  She underwent successful PCI with DES x1 to the distal RCA and DES x1 to the proximal LAD  . GERD (gastroesophageal reflux disease)   . History of blood transfusion    "don't remember why or when" (02/26/2018)  . Hyperlipidemia   . Hypertension   . Nonalcoholic hepatosteatosis   . TIA (transient ischemic attack) 2000s   "I've had ~ 3" (02/26/2018)  . Type II diabetes mellitus ([place])     Past Surgical History:  Procedure Laterality Date  . ABDOMINAL HYSTERECTOMY    . APPENDECTOMY  1950s  . CARDIAC CATHETERIZATION  2001   Nonobstructive CAD  . CATARACT EXTRACTION W/  INTRAOCULAR LENS  IMPLANT, BILATERAL Bilateral 2000s  . CORONARY STENT INTERVENTION N/A 02/26/2018   Procedure: CORONARY STENT INTERVENTION;  Surgeon: Martinique, Peter M, MD;  Location: [facility];  Service: Cardiovascular;  Laterality: N/A;  rca   . LEFT HEART CATH AND CORONARY ANGIOGRAPHY N/A 02/26/2018   Procedure: LEFT HEART CATH AND CORONARY ANGIOGRAPHY;  Surgeon: Martinique, Peter M, MD;  Location: [facility];  Service: Cardiovascular;  Laterality: N/A;  . TONSILLECTOMY  1943     Home Medications:  Prior to Admission medications   Medication Sig Start Date  End Date Taking? Authorizing Provider  aspirin EC 81 MG EC tablet Take 1 tablet (81 mg total) by mouth daily. 05/11/18  Yes Johnson, Clanford L, MD  carboxymethylcellulose (REFRESH) 1 % ophthalmic solution Place 1 drop into both eyes 2 (two) times daily as needed (dry eyes).    Yes [provider]  clopidogrel (PLAVIX) 75 MG tablet Take 1 tablet (75 mg total) by mouth daily with breakfast. 04/02/18  Yes Strader, [place] M, PA-C  docusate sodium (COLACE) 100 MG capsule Take 200 mg by mouth 2 (two) times daily as needed for mild constipation.    Yes [provider]  losartan (COZAAR) 25 MG tablet Take 1 tablet (25 mg total) by mouth daily. 05/10/18  Yes Johnson, Clanford L, MD  metFORMIN (GLUCOPHAGE-XR) 500 MG 24 hr tablet Take 1 tablet (500 mg total) by mouth 2 (two) times daily with a meal. 05/10/18  Yes Johnson, Clanford L, MD  metoprolol succinate (TOPROL-XL) 25 MG 24 hr tablet Take 1 tablet (25 mg total) by mouth daily. 04/02/18  Yes Strader, Fernando [place], PA-C  Multiple Vitamin (MULTIVITAMIN) tablet Take 1 tablet by mouth daily.     Yes [provider]  nitroGLYCERIN (NITROSTAT) 0.4 MG SL tablet Place 0.4 mg under the tongue every 5 (five) minutes as needed for chest pain.   Yes de Stanford Scotland, MD  pantoprazole (PROTONIX) 40 MG tablet Take 1 tablet (40 mg total) by mouth daily. 04/02/18  Yes Strader, [place], PA-C  fexofenadine (ALLEGRA) 180 MG tablet Take 180 mg by mouth daily.      [provider]  fluticasone (FLONASE) 50 MCG/ACT nasal spray Place 2 sprays into both nostrils daily.     [provider]  rosuvastatin (CRESTOR) 5 MG tablet Take 1 tablet (5 mg total) by mouth daily at 6 PM. 05/10/18 08/08/18  Johnson, Clanford L, MD  ursodiol (ACTIGALL) 300 MG capsule Take 300 mg by mouth 2 (two) times daily.     [provider]  vitamin E (VITAMIN E) 400 UNIT capsule Take 400 Units by mouth 2 (two) times daily.     [provider]     Inpatient Medications: Scheduled Meds: . aspirin  81 mg Oral Daily  . docusate sodium  100 mg Oral BID  . fluticasone  2 spray Each Nare Daily  . insulin aspart  0-9 Units Subcutaneous TID WC  . loratadine  10 mg Oral Daily  . metoprolol succinate  25 mg Oral Daily  . nitroGLYCERIN  1 inch Topical Once  . pantoprazole  40 mg Oral Daily  . rosuvastatin  5 mg Oral q1800  . ursodiol  300 mg Oral BID   Continuous Infusions: . sodium chloride    . heparin 800 Units/hr (09/03/18 0741)   PRN Meds: acetaminophen **OR** acetaminophen, albuterol, carboxymethylcellulose, HYDROcodone-acetaminophen, ketorolac, levalbuterol, magnesium citrate, morphine injection, ondansetron **OR** ondansetron (ZOFRAN) IV, senna-docusate, sorbitol,  traZODone  Allergies:    Allergies  Allergen Reactions  . Ivp Dye [Iodinated Diagnostic Agents] Anaphylaxis  . Sulfonamide Derivatives Shortness Of Breath  . Lipitor [Atorvastatin Calcium]     Muscle Aches  . Zetia [Ezetimibe]     Muscle Aches  . Penicillins Rash    As a teenager. Has patient had a PCN reaction causing immediate rash, facial/tongue/throat swelling, SOB or lightheadedness with hypotension: No Has patient had a PCN reaction causing severe rash involving mucus membranes or skin necrosis: No Has patient had a PCN reaction that required hospitalization: No Has patient had a PCN reaction occurring within the last 10 years: No If all of the above answers are "NO", then may proceed with Cephalosporin use.     Social History:   Social History   Socioeconomic History  . Marital status: Married    Spouse name: Not on file  . Number of children: Not on file  . Years of education: Not on file  . Highest education level: Not on file  Occupational History  . Not on file  Social Needs  . Financial resource strain: Not on file  . Food insecurity:    Worry: Not on file    Inability: Not on file  . Transportation needs:    Medical: Not on file     Non-medical: Not on file  Tobacco Use  . Smoking status: Former Smoker    Packs/day: 2.00    Years: 40.00    Pack years: 80.00    Types: Cigarettes    Last attempt to quit: 06/05/1992    Years since quitting: 26.2  . Smokeless tobacco: Former Systems developer    Types: Chew    Quit date: 12/03/1992  . Tobacco comment: used tobacco to help quit smoking  Substance and Sexual Activity  . Alcohol use: Never    Frequency: Never  . Drug use: Never  . Sexual activity: Not on file  Lifestyle  . Physical activity:    Days per week: Not on file    Minutes per session: Not on file  . Stress: Not on file  Relationships  . Social connections:    Talks on phone: Not on file    Gets together: Not on file    Attends religious service: Not on file    Active member of club or organization: Not on file    Attends meetings of clubs or organizations: Not on file    Relationship status: Not on file  . Intimate partner violence:    Fear of current or ex partner: Not on file    Emotionally abused: Not on file    Physically abused: Not on file    Forced sexual activity: Not on file  Other Topics Concern  . Not on file  Social History Narrative  . Not on file     Family History:    Family History  Problem Relation Age of Onset  . Heart failure Mother   . Prostate cancer Father   . Prostate cancer Brother       Review of Systems    General:  No chills, fever, night sweats or weight changes.  Cardiovascular:  No chest pain, edema, orthopnea, palpitations, paroxysmal nocturnal dyspnea. Positive for left arm pain and dyspnea.  Dermatological: No rash, lesions/masses Respiratory: No cough, Positive for dyspnea.  Urologic: No hematuria, dysuria Abdominal:   No nausea, vomiting, diarrhea, bright red blood per rectum, melena, or hematemesis Neurologic:  No visual changes, wkns, changes in  mental status. All other systems reviewed and are otherwise negative except as noted above.  Physical Exam/Data     Vitals:   09/03/18 0530 09/03/18 0600 09/03/18 0605 09/03/18 0730  BP: (!) 187/100 (!) 184/95 (!) 141/69 (!) 174/98  Pulse: 83 84 (!) 52   Resp: (!) 23 17 (!) 23 20  Temp:      TempSrc:      SpO2: 97% 95% 98%   Weight:      Height:        Intake/Output Summary (Last 24 hours) at 09/03/2018 0810 Last data filed at 09/03/2018 0648 Gross per 24 hour  Intake -  Output 500 ml  Net -500 ml   Filed Weights   09/03/18 0155  Weight: 70.8 kg   Body mass index is 28.53 kg/m.   General: Pleasant, elderly female appearing in NAD, frail appearing Psych: Normal affect. Neuro: Alert, intermittently oriented x 3. Moves all extremities spontaneously. HEENT: Normal  Neck: Supple without bruits or JVD. Lungs:  Resp regular and unlabored, CTA without wheezing or rales. Heart: RRR no s3, s4, 2/6 SEM along RUSB.  Abdomen: Soft, non-tender, non-distended, BS + x 4.  Extremities: No clubbing, cyanosis or edema. DP/PT/Radials 2+ and equal bilaterally.   EKG:  The EKG was personally reviewed and demonstrates: Initial EKG showed NSR, HR 77, with LAD and nonspecific ST abnormality along inferior leads. Repeat tracing at 0600 showed more significant TWI along inferolateral leads.   Labs/Studies     Relevant CV Studies:  Cardiac Catheterization: 02/2018  Dist RCA lesion is 90% stenosed.  Prox LAD lesion is 95% stenosed.  Ost 1st Mrg lesion is 70% stenosed.  A drug-eluting stent was successfully placed using a STENT SIERRA 3.50 X 15 MM.  Post intervention, there is a 0% residual stenosis.  Post intervention, there is a 0% residual stenosis.  A drug-eluting stent was successfully placed using a STENT SIERRA 3.00 X 15 MM.  LV end diastolic pressure is normal.   1. 3 vessel obstructive CAD.     - focal 95% proximal LAD    - 70% OM1    - 90% focal distal RCA 2. Normal LVEDP 3. Successful PCI of the Distal RCA with DES x 1 4. Successful PCI of the proximal LAD with DES x 1  Plan:  observe overnight. May resume coumadin tonight per pharmacy. Since she is on DAPT I would not bridge with Lovenox. Anticipate DC in am. She is a candidate for the Xience 28 study and if enrolled this will determine her antiplatelet strategy.  Recommend to resume Warfarin, at currently prescribed dose and frequency, on 02/26/18.  Recommend concurrent antiplatelet therapy of Aspirin 24m daily for 1 month and Clopidogrel 765mdaily for 6 months.  Echocardiogram: 05/2018 Study Conclusions  - Left ventricle: The cavity size was normal. Wall thickness was   increased in a pattern of mild LVH. Systolic function was normal.   The estimated ejection fraction was in the range of 60% to 65%.   Wall motion was normal; there were no regional wall motion   abnormalities. Doppler parameters are consistent with abnormal   left ventricular relaxation (grade 1 diastolic dysfunction). - Aortic valve: Moderately calcified annulus. Probably trileaflet;   moderately calcified leaflets. There was moderate stenosis. Mean   gradient (S): 11 mm Hg. Peak gradient (S): 21 mm Hg. VTI ratio of   LVOT to aortic valve: 0.44. Valve area (VTI): 1.24 cm^2. Valve   area (Vmax): 1.23 cm^2. -  Mitral valve: Mildly to moderately calcified annulus. Mildly   thickened leaflets . There was trivial regurgitation. - Right atrium: Central venous pressure (est): 3 mm Hg. - Atrial septum: No defect or patent foramen ovale was identified. - Tricuspid valve: There was mild regurgitation. - Pulmonary arteries: PA peak pressure: 21 mm Hg (S). - Pericardium, extracardiac: A prominent pericardial fat pad was   present.   Laboratory Data:  Chemistry Recent Labs  Lab 09/03/18 0156 09/03/18 0628  NA 141  --   K 4.2  --   CL 101  --   CO2 29  --   GLUCOSE 214*  --   BUN 15  --   CREATININE 0.66  --   CALCIUM 9.9 9.5  GFRNONAA >60  --   GFRAA >60  --   ANIONGAP 11  --     Recent Labs  Lab 09/03/18 0156  PROT 7.4  ALBUMIN  4.0  AST 20  ALT 11  ALKPHOS 136*  BILITOT 0.4   Hematology Recent Labs  Lab 09/03/18 0156  WBC 11.5*  RBC 4.57  HGB 14.0  HCT 43.6  MCV 95.4  MCH 30.6  MCHC 32.1  RDW 12.9  PLT 250   Cardiac Enzymes Recent Labs  Lab 09/03/18 0156 09/03/18 0341 09/03/18 0628  TROPONINI 0.43* 0.28* 0.48*   No results for input(s): TROPIPOC in the last 168 hours.  BNP Recent Labs  Lab 09/03/18 0156 09/03/18 0341  BNP 463.0* 415.0*    DDimer  Recent Labs  Lab 09/03/18 0156  DDIMER 0.35    Radiology/Studies:  Dg Chest 2 View  Result Date: 09/03/2018 CLINICAL DATA:  LEFT arm pain. EXAM: CHEST - 2 VIEW COMPARISON:  Chest radiograph June 04, 2013 FINDINGS: Cardiac silhouette is mildly enlarged. Mediastinal silhouette is normal. Mildly calcified aortic arch. Mild chronic interstitial changes. No pleural effusions or focal consolidations. Trachea projects midline and there is no pneumothorax. Soft tissue planes and included osseous structures are non-suspicious. IMPRESSION: 1. Mild cardiomegaly. No acute pulmonary process. 2. Aortic Atherosclerosis (ICD10-I70.0). Electronically Signed   By: Elon Alas M.D.   On: 09/03/2018 02:32   Ct Head Wo Contrast  Result Date: 09/03/2018 CLINICAL DATA:  LEFT arm pain since yesterday. History of stroke and LEFT-sided weakness. EXAM: CT HEAD WITHOUT CONTRAST TECHNIQUE: Contiguous axial images were obtained from the base of the skull through the vertex without intravenous contrast. COMPARISON:  CT HEAD May 31, 2018 FINDINGS: Mild motion degraded examination. BRAIN: No intraparenchymal hemorrhage, mass effect nor midline shift. No parenchymal brain volume loss for age. No hydrocephalus. Confluent supratentorial white matter hypodensities. Old basal ganglia and thalami infarcts. No acute large vascular territory infarcts. No abnormal extra-axial fluid collections. Basal cisterns are patent. VASCULAR: Moderate calcific atherosclerosis of the  carotid siphons. SKULL: No skull fracture. Osteopenia. No significant scalp soft tissue swelling. SINUSES/ORBITS: Maxillary mucosal retention cyst. Small RIGHT mastoid effusion.The included ocular globes and orbital contents are non-suspicious. Status post bilateral ocular lens implants. OTHER: None. IMPRESSION: 1. No acute intracranial process. 2. Stable examination including severe chronic small vessel ischemic changes and old lacunar infarcts. Electronically Signed   By: Elon Alas M.D.   On: 09/03/2018 02:16     Assessment & Plan     1. Elevated Troponin/CAD - she is s/p DES to distal RCA and DES to proximal LAD in 02/2018. Presented with left arm pain which improved with SL NTG. Reported dyspnea but no chest pain.  - Initial troponin 0.43 with repeat  values of 0.28 and 0.48. Initial EKG showed NSR, HR 77, with LAD and nonspecific ST abnormality along inferior leads. Repeat tracing at 0600 showed more significant TWI along inferolateral leads.  - she has been started on IV Heparin. Continue PTA ASA, Plavix, BB, and statin therapy. Dr. Bronson Ing to see the patient later this morning to determine continual medical management versus possible catheterization. Keep NPO for now.   2. Accelerated HTN - Initial BP was at 180/98, peaking at 227/125. A NTG patch was placed with improvement in her readings but still elevated (174/98 on most recent check).  - was continued on PTA Toprol-XL 46m daily. PTA Losartan initially not ordered but will order at this time. Listed as 215mdaily but prior notes mention she was taking 5033maily. This needs to be verified with her outside pharmacy.   3. HLD - FLP this admission shows total cholesterol of 227, HDL 55, Triglycerides 146, and LDL 143. Was previously intolerant to Atorvastatin, Simvastatin, and Zetia in the past due to myalgias and has not been interested in injectable medications. Was taking Crestor 5mg90mily PTA. Would consider titration to 20mg63mily given she remains above goal.   4. Aortic Stenosis - moderate by echo in 05/2018. Continue to follow.   5. History of Prior CVA with Event Monitor Showing PAF - she has a history of prior TIA's (previously on Coumadin) and was admitted with an acute CVA in 05/2018. Event monitor following her CVA did show episodes of atrial fibrillation and it was thought she was still on Coumadin at that time but this had been discontinued by Neurology.  - given her multiple neurologic events and documented PAF, she would benefit from anticoagulation. Consider Eliquis 5mg B68m(only indication for reduced dosing at this time is age) once it is determined no invasive procedures will be indicated this admission. Would likely stop Plavix if doing so as she was to only be on this for 6 months following cardiac stent placement in 02/2018.   For questions or updates, please contact [facility] consult [email or web address] for contact info under Cardiology/STEMI.  Signed, BrittaErma Heritage 09/03/2018, 8:10 AM Pager: [phone] was seen and examined, and I agree with the history, physical exam, assessment and plan as documented above, with modifications as noted below. I have also personally reviewed all relevant documentation, old records, labs, and both radiographic and cardiovascular studies. I have also independently interpreted old and new ECG's.  89 yea72old woman well-known to me whom I last evaluated in the office in December 2019.  She had a recent CVA at that time.  I pursued event monitoring which demonstrated SVT and paroxysmal atrial fibrillation.  [facility] with complaints of left arm pain.  She had had some shortness of breath at that time but denied chest pain.  She was found to be markedly hypertensive with a peak blood pressure of 227/125.  She was placed on a nitroglycerin patch and IV heparin as troponins were  elevated with the highest level being 0.48.  Head CT showed no acute abnormalities.  Initial ECG showed sinus rhythm with nonspecific T wave abnormalities with follow-up ECG demonstrating inferolateral T wave inversions.  Chest x-ray did not show any evidence of pulmonary edema.  Head CT showed no acute findings with chronic small vessel ischemic changes and old lacunar infarcts being demonstrated.  Brain MRI results are pending at the time of this dictation.  She currently denies chest pain, palpitations, shortness of breath, and left arm pain.  She could not recall what month it was and told me that her memory fluctuates.  She told me she spoke to her son earlier but did not recognize his voice initially.  She remains hypertensive and has been restarted on a lower dose of losartan, 25 mg daily along with Toprol-XL 25 mg daily.  She is also on aspirin, Plavix, and rosuvastatin 10 mg.  She cannot tell me if this is reminiscent of her anginal equivalent.  She does specifically deny chest pain, left arm pain, and shortness of breath during the time of my evaluation.  I will elect to treat her medically.  I am not convinced that this represents an acute coronary syndrome and may be likely more indicative of demand ischemia in the context of accelerated hypertension given the relatively flat trend overall.    She needs better blood pressure control.  I will also check additional troponins and if they significantly rise and she develops intractable symptoms, I would then consider coronary angiography.  I will continue IV heparin for the time being.  Once heparin is discontinued, she will need to be on aspirin 81 mg and Eliquis 5 mg twice daily.  Plavix should be discontinued once apixaban is initiated.  Kate Sable, MD, [facility]  09/03/2018 11:30 AM   ADDENDUM: Troponins up to 1.9. Left arm pain likely anginal equivalent, now resolved with nitro paste. I will arrange for transfer to [facility]  for coronary angiography for NSTEMI. Risks and benefits of cardiac catheterization have been discussed with the patient.  These include bleeding, infection, kidney damage, stroke, heart attack, death.  The patient understands these risks and is willing to proceed.  I have spoken to cath lab staff regarding transfer and they will assist.

## 2018-09-03 NOTE — ED Notes (Signed)
Date and time results received: 09/03/18 0248  Test: Troponin Critical Value: 0.43  Name of Provider Notified: Tomi Bamberger MD  Orders Received? Or Actions Taken?: Acknowledged.

## 2018-09-03 NOTE — H&P (Signed)
History and Physical   Patient: [name]                            PCP: Loman Brooklyn, FNP                    DOB: 08/26/1927            DOA: 09/03/2018 [id number]             DOS: 09/03/2018, 6:35 AM  Patient coming from:   Home  I have personally reviewed patient's medical records, in electronic medical records, including: [place] link, and care everywhere.    Chief Complaint:   Transient left arm pain  History of present illness:    [name] is a 85 y.o. female with medical history significant of diabetes mellitus type 2, TIAs, CVA (with residual left-sided weakness), coronary artery disease with 2 stents, hypertension, hyperlipidemia, presented with acute transient left arm weakness. The patient reported that overnight she developed some left arm, elbow pain with radiation to her hand.  She explained that the pain furthermore extended to her fingers.  She did not complain of any   shortness of breath or chest pain.  Patient has no complaint at this time, stable. Has any headaches visual changes asymmetric weaknesses. Reported she lives at home with her husband, ambulates with walker.  Confirms history of coronary artery disease with stents and CVA.  Left-sided weakness Denies any recent illnesses.  Denies any upper respiratory illnesses, recent travel or sick contact.  Denies any abdominal pain constipation or diarrhea.  Denies any joint pain.  Denies of having any rash or open wounds. Patient is a poor historian per ED physician family has given her 1 nitroglycerin at home.     ED Course: Was evaluated in ED: On arrival patient's blood pressure was elevated systolic blood pressure greater than 200, positive troponins, subtle changes in EKG ED physician has called [facility] and spoke with cardiologist 5:47 AM Dr. Marietta Lions,  Who recommended the patient to be seen by her primary cardiology here for evaluation, for transferring to [facility]. With nitroglycerin patient's  blood pressure and heart rate has improved actually patient became bradycardic at heart rate of 44, repeated EKG revealed inverted T waves in her inferior leads life any ST elevation or depression Cardiologist at home  6:18 AM Dr. Stanford Breed, was once again contacted.  The patient is asymptomatic, subtle changes on EKG recommend IV heparin once again recommended to be evaluated before transfer to [facility] for possible cardiac cath today.  Patient was also evaluated by telemetry neurology, no concerns for strokes at this time.  CT of head was reviewed negative  Review of Systems: As per HPI otherwise 12 point review of systems negative.    Assessment / Plan:   Active Problems:  Transient left arm pain /?  Chest pain -Patient will be admitted to the monitored floor, -Ruling out MI -Troponin as high as 0.43 >> 0.28   (possibly limited due to accelerated hypertension versus acute MI, not complaining of chest pain, negative any ST elevation depression on EKG_  -Continue supportive measures, heparin drip, PRN nitroglycerin, likely cardiac enzymes, feeding EKGs  -ED physician has spoken to cardiologist at [facility] at 5:47 AM Dr. [place]u Lions, and 6:18 AM Dr. Stanford Breed, regarding positive troponin, EKG changes  -Recommending patient to be evaluated by cardiologist here -in consideration of further work-up including cardiac catheterization in which  patient needs to be transferred  She has seen Dr. Bronson Ing in the past.  -Patient has been initiated on heparin drip which will be continued -The patient n.p.o.  History of coronary artery disease/stents -Medical record has been reviewed status post cardiac catheterization on 02/26/2018 with three-vessel disease, 95% stenosis in the proximal LAD, 90% distal RCA, 70% OM1.  Status post successful PCI with DES x1 and distal RCA and DES x1 to the proximal LAD -Continue aspirin, Plavix,  Losartan , Toprol, Crestor, PRN nitroglycerin   Accelerated hypertension  -On admission patient was elevated as high as 227/125 -Patient responded well to nitroglycerin -Resuming home medication including Toprol, -Holding losartan for now -PRN nitroglycerin, hydralazine  Type II diabetes mellitus ([place]) -Holding home medication including metformin -CBG q. before meals at bedtime with SSI  Hyperlipidemia -Continue statins, Crestor   GERD (gastroesophageal reflux disease) -Continue PPI     CVA/TIA (transient ischemic attack) -Residual finding of left-sided weakness, ambulates with assist of a walker -Continue aspirin and Plavix -PT/OT -Patient CT of the head was reviewed, tele-neurology was consulted, negative any focal neurological findings no concern for recurrent CVA/TIA this time. -We will continue with neurochecks     DVT prophylaxis: Will be on heparin drip  Code Status:   Code Status: Full Code  Family Communication:  The above findings and plan of care has been discussed with patient in detail, She expressed understanding and agreement of above plan.   Disposition Plan: >3 days  Consults called: Cardiology at [facility] by ED staff  Local cardiology were consulted  Admission status: Patient will be admitted as Inpatient, with a greater than 2 midnight length of stay.  ----------------------------------------------------------------------------------------------------------------------  Allergies  Allergen Reactions  . Ivp Dye [Iodinated Diagnostic Agents] Anaphylaxis  . Sulfonamide Derivatives Shortness Of Breath  . Lipitor [Atorvastatin Calcium]     Muscle Aches  . Zetia [Ezetimibe]     Muscle Aches  . Penicillins Rash    As a teenager. Has patient had a PCN reaction causing immediate rash, facial/tongue/throat swelling, SOB or lightheadedness with hypotension: No Has patient had a PCN reaction causing severe rash involving mucus membranes or skin necrosis: No Has patient had a PCN reaction that required hospitalization: No Has  patient had a PCN reaction occurring within the last 10 years: No If all of the above answers are "NO", then may proceed with Cephalosporin use.     Home MEDs:  Prior to Admission medications   Medication Sig Start Date End Date Taking? Authorizing Provider  aspirin EC 81 MG EC tablet Take 1 tablet (81 mg total) by mouth daily. 05/11/18  Yes Johnson, Clanford L, MD  carboxymethylcellulose (REFRESH) 1 % ophthalmic solution Place 1 drop into both eyes 2 (two) times daily as needed (dry eyes).    Yes [provider]  clopidogrel (PLAVIX) 75 MG tablet Take 1 tablet (75 mg total) by mouth daily with breakfast. 04/02/18  Yes Strader, [place] M, PA-C  docusate sodium (COLACE) 100 MG capsule Take 200 mg by mouth 2 (two) times daily as needed for mild constipation.    Yes [provider]  losartan (COZAAR) 25 MG tablet Take 1 tablet (25 mg total) by mouth daily. 05/10/18  Yes Johnson, Clanford L, MD  metFORMIN (GLUCOPHAGE-XR) 500 MG 24 hr tablet Take 1 tablet (500 mg total) by mouth 2 (two) times daily with a meal. 05/10/18  Yes Johnson, Clanford L, MD  metoprolol succinate (TOPROL-XL) 25 MG 24 hr tablet Take 1 tablet (25  mg total) by mouth daily. 04/02/18  Yes Strader, [place], PA-C  Multiple Vitamin (MULTIVITAMIN) tablet Take 1 tablet by mouth daily.     Yes [provider]  nitroGLYCERIN (NITROSTAT) 0.4 MG SL tablet Place 0.4 mg under the tongue every 5 (five) minutes as needed for chest pain.   Yes de Stanford Scotland, MD  pantoprazole (PROTONIX) 40 MG tablet Take 1 tablet (40 mg total) by mouth daily. 04/02/18  Yes Strader, [place], PA-C  fexofenadine (ALLEGRA) 180 MG tablet Take 180 mg by mouth daily.      [provider]  fluticasone (FLONASE) 50 MCG/ACT nasal spray Place 2 sprays into both nostrils daily.     [provider]  rosuvastatin (CRESTOR) 5 MG tablet Take 1 tablet (5 mg total) by mouth daily at 6 PM. 05/10/18 08/08/18  Johnson, Clanford L, MD   ursodiol (ACTIGALL) 300 MG capsule Take 300 mg by mouth 2 (two) times daily.     [provider]  vitamin E (VITAMIN E) 400 UNIT capsule Take 400 Units by mouth 2 (two) times daily.     [provider]    PRN MEDs: acetaminophen **OR** acetaminophen, albuterol, HYDROcodone-acetaminophen, ketorolac, levalbuterol, magnesium citrate, morphine injection, ondansetron **OR** ondansetron (ZOFRAN) IV, senna-docusate, sorbitol, traZODone  Past Medical History:  Diagnosis Date  . Allergic rhinitis   . Arthritis    "probably in my hands" (02/26/2018)  . Colonic polyp   . Coronary artery disease    a. s/p cath on 02/26/2018 with 3-vessel CAD with 95% proximal LAD, 90% distal RCA, and 70% OM1 stenosis.  She underwent successful PCI with DES x1 to the distal RCA and DES x1 to the proximal LAD  . GERD (gastroesophageal reflux disease)   . History of blood transfusion    "don't remember why or when" (02/26/2018)  . Hyperlipidemia   . Hypertension   . Nonalcoholic hepatosteatosis   . TIA (transient ischemic attack) 2000s   "I've had ~ 3" (02/26/2018)  . Type II diabetes mellitus ([place])     Past Surgical History:  Procedure Laterality Date  . ABDOMINAL HYSTERECTOMY    . APPENDECTOMY  1950s  . CARDIAC CATHETERIZATION  2001   Nonobstructive CAD  . CATARACT EXTRACTION W/ INTRAOCULAR LENS  IMPLANT, BILATERAL Bilateral 2000s  . CORONARY STENT INTERVENTION N/A 02/26/2018   Procedure: CORONARY STENT INTERVENTION;  Surgeon: Martinique, Peter M, MD;  Location: [place] [facility];  Service: Cardiovascular;  Laterality: N/A;  rca   . LEFT HEART CATH AND CORONARY ANGIOGRAPHY N/A 02/26/2018   Procedure: LEFT HEART CATH AND CORONARY ANGIOGRAPHY;  Surgeon: Martinique, Peter M, MD;  Location: [place] [facility];  Service: Cardiovascular;  Laterality: N/A;  . TONSILLECTOMY  1943     reports that she quit smoking about 26 years ago. Her smoking use included cigarettes. She has a 80.00 pack-year smoking  history. She quit smokeless tobacco use about 25 years ago.  Her smokeless tobacco use included chew. She reports that she does not drink alcohol or use drugs.   Family History  Problem Relation Age of Onset  . Heart failure Mother   . Prostate cancer Father   . Prostate cancer Brother     Physical Exam:   Constitutional: NAD, calm, comfortable Vitals:   09/03/18 0500 09/03/18 0530 09/03/18 0600 09/03/18 0605  BP: (!) 173/98 (!) 187/100 (!) 184/95 (!) 141/69  Pulse: 85 83 84 (!) 52  Resp: (!) 21 (!) 23 17 (!) 23  Temp:      TempSrc:      SpO2: 96% 97% 95% 98%  Weight:      Height:       Eyes: PERRL, lids and conjunctivae normal ENMT: Mucous membranes are moist. Posterior pharynx clear of any exudate or lesions.Normal dentition.  Neck: normal, supple, no masses, no thyromegaly Respiratory: clear to auscultation bilaterally, no wheezing, no crackles. Normal respiratory effort. No accessory muscle use.  Cardiovascular: Regular rate and rhythm, no murmurs / rubs / gallops. No extremity edema. 2+ pedal pulses. No carotid bruits.  Abdomen: no tenderness, no masses palpated. No hepatosplenomegaly. Bowel sounds positive.  Musculoskeletal: Left-sided weakness, no clubbing / cyanosis. No joint deformity upper and lower extremities. Good ROM, no contractures. Normal muscle tone.  Neurologic: Mild left-sided weakness otherwise strength intact in all 4 extremities CN II-XII grossly intact. Sensation intact, DTR normal.  Psychiatric: Normal judgment and insight. Alert and oriented x 3. Normal mood.  Skin: no rashes, lesions, ulcers. No induration Decubitus/ulcers: none  Urinary catheter: Chronic indwelling/was placed in this admission  Labs on admission:    I have personally reviewed following labs and imaging studies  CBC: Recent Labs  Lab 09/03/18 0156  WBC 11.5*  NEUTROABS 8.5*  HGB 14.0  HCT 43.6  MCV 95.4  PLT 628   Basic Metabolic Panel: Recent Labs  Lab 09/03/18 0156   NA 141  K 4.2  CL 101  CO2 29  GLUCOSE 214*  BUN 15  CREATININE 0.66  CALCIUM 9.9   GFR: Estimated Creatinine Clearance: 44.8 mL/min (by C-G formula based on SCr of 0.66 mg/dL). Liver Function Tests: Recent Labs  Lab 09/03/18 0156  AST 20  ALT 11  ALKPHOS 136*  BILITOT 0.4  PROT 7.4  ALBUMIN 4.0    Coagulation Profile: Recent Labs  Lab 09/03/18 0156  INR 1.1   Cardiac Enzymes: Recent Labs  Lab 09/03/18 0156 09/03/18 0341  TROPONINI 0.43* 0.28*   ASSESSMENT:    CBG: Recent Labs  Lab 09/03/18 0134  GLUCAP 181*   Urine analysis:    Component Value Date/Time   COLORURINE STRAW (A) 05/07/2018 0213   APPEARANCEUR CLEAR 05/07/2018 0213   LABSPEC 1.005 05/07/2018 0213   PHURINE 7.0 05/07/2018 0213   GLUCOSEU NEGATIVE 05/07/2018 0213   HGBUR NEGATIVE 05/07/2018 0213   BILIRUBINUR NEGATIVE 05/07/2018 0213   KETONESUR NEGATIVE 05/07/2018 0213   PROTEINUR NEGATIVE 05/07/2018 0213   NITRITE NEGATIVE 05/07/2018 0213   LEUKOCYTESUR NEGATIVE 05/07/2018 0213     Radiologic Exams on Admission:   Dg Chest 2 View  Result Date: 09/03/2018 CLINICAL DATA:  LEFT arm pain. EXAM: CHEST - 2 VIEW COMPARISON:  Chest radiograph June 04, 2013 FINDINGS: Cardiac silhouette is mildly enlarged. Mediastinal silhouette is normal. Mildly calcified aortic arch. Mild chronic interstitial changes. No pleural effusions or focal consolidations. Trachea projects midline and there is no pneumothorax. Soft tissue planes and included osseous structures are non-suspicious. IMPRESSION: 1. Mild cardiomegaly. No acute pulmonary process. 2. Aortic Atherosclerosis (ICD10-I70.0). Electronically Signed   By: Elon Alas M.D.   On: 09/03/2018 02:32   Ct Head Wo Contrast  Result Date: 09/03/2018 CLINICAL DATA:  LEFT arm pain since yesterday. History of stroke and LEFT-sided weakness. EXAM: CT HEAD WITHOUT CONTRAST TECHNIQUE: Contiguous axial images were obtained from the base of the skull  through the vertex without intravenous contrast. COMPARISON:  CT HEAD May 31, 2018 FINDINGS: Mild motion degraded examination. BRAIN: No intraparenchymal hemorrhage, mass effect nor midline shift. No  parenchymal brain volume loss for age. No hydrocephalus. Confluent supratentorial white matter hypodensities. Old basal ganglia and thalami infarcts. No acute large vascular territory infarcts. No abnormal extra-axial fluid collections. Basal cisterns are patent. VASCULAR: Moderate calcific atherosclerosis of the carotid siphons. SKULL: No skull fracture. Osteopenia. No significant scalp soft tissue swelling. SINUSES/ORBITS: Maxillary mucosal retention cyst. Small RIGHT mastoid effusion.The included ocular globes and orbital contents are non-suspicious. Status post bilateral ocular lens implants. OTHER: None. IMPRESSION: 1. No acute intracranial process. 2. Stable examination including severe chronic small vessel ischemic changes and old lacunar infarcts. Electronically Signed   By: Elon Alas M.D.   On: 09/03/2018 02:16    EKG:   Independently reviewed.   Orders placed or performed during the hospital encounter of 09/03/18  . EKG 12-Lead  . EKG 12-Lead  . EKG 12-Lead  . EKG 12-Lead  . EKG 12-Lead     Time spent: > than  55  Min.   Deatra James MD [facility] ,  Pager [phone]  If 7PM-7AM, please contact night-coverage [email or web address]  Password [facility] 09/03/2018, 6:35 AM

## 2018-09-03 NOTE — Progress Notes (Addendum)
PT Cancellation Note  Patient Details Name: [name] MRN: [id number] DOB: 02-03-1928   Cancelled Treatment:    Reason Eval/Treat Not Completed: Medical issues which prohibited therapy  Patient's troponin level is at 0.48, patient also transferred from ED to ICU. Will hold therapy at this time due to these medical issues.   Clarene Critchley PT, DPT 11:07 AM, 09/03/18 [phone]

## 2018-09-03 NOTE — Progress Notes (Addendum)
Spoke with Dr. Bronson Ing earlier about transfer to [facility]. MRI brain returned -I called neurology, Dr. Cheral Marker, at [facility] will see pt after transfer -Dr. Cheral Marker requested CTA head and neck but pt has anaphylaxis to IV dye and pt anticipated to have heart cath tomorrow -ordered MRA brain -carotid US  DTat

## 2018-09-03 NOTE — Consult Note (Signed)
  Date:09/03/18 [facility] Tele[facility]urology Consult Services  Impression: Transient left arm pain - diff diagnosis includes cardiac etiology vs peripheral nerve transient impingement; unlikely to be central pain  Recommendations:  if recurrent sxs, consider emg/ncs and mri c-spine as an outpatient memory evaluation as an outpatient w neurology oupt no further inpt neurology w/u needed at this time  --------------------------------------------------------------------- JE:HUDJSHFWY left arm pain  HPI: 85yo W w pmh of dm, tia, htn, hld, gerd, stroke w residual L HP, cognitive impairment (dementia not formally diagnosed), on asa and plavix at baseline p/w transient left arm pain lasting ~54mn yesterdaya t 22:00.  No current sxs.  She had ekg and cardiac w/u and was cleared by ED.  Consulted for evaluation for possible neurological etiology.  Patient w memory impairment and unable to provide full hx but denies current pain.   Diagnostic results: hct IMPRESSION: 1. No acute intracranial process. 2. Stable examination including severe chronic small vessel ischemic changes and old lacunar infarcts. Vital Signs:    Exam:  Mental Status:  awake, alert, oriented to person, not month (pre-existent) speech: fluent naming: intact repetition: intact  Cranial Nerves:  mild dysarthria - pre-existent Visual fields:  intact by confrontation Extraocular movements: intact in directions of cardinal gaze Ptosis: Absent Facial sensation: intact Facial movements: decreased L NLF (pre-existent)  Motor Exam:  left arm and leg drift (pre-existent)   Tremor/Abnormal Movements:  intention tremor:absent postural tremor:absent resting tremor:absent  Sensory Exam:   Light touch: decreased on the left side (likely pre-existent)  Coordination:   Finger nose finger: intact  nihss 6 Medical Decision Making:  - Extensive number of diagnosis or management options are considered above.   -  Extensive amount of complex data reviewed.   - High risk of complication and/or morbidity or mortality are associated with differential diagnostic considerations above.  - There may be uncertain outcome and increased probability of prolonged functional impairment or high probability of severe prolonged functional impairment associated with some of these differential diagnosis.   Medical Data Reviewed:  1.Data reviewed include clinical labs, radiology,  Medical Tests;   2.Tests results discussed w/performing or interpreting physician;   3.Obtaining/reviewing old medical records;  4.Obtaining case history from another source;  5.Independent review of image, tracing or specimen.    Patient was informed the Neurology Consult would happen via TeleHealth consult by way of interactive audio and video telecommunications and consented to receiving care in this manner.

## 2018-09-03 NOTE — TOC Initial Note (Signed)
Transition of Care [facility]) - Initial/Assessment Note    Patient Details  Name: [name] MRN: [id number] Date of Birth: March 05, 1928  Transition of Care [facility]) CM/SW Contact:    Starsky Nanna Dimitri Ped, LCSW Phone Number: 09/03/2018, 2:54 PM  Clinical Narrative:  CSW met with Pt at bedside to gage for Pt's ability to accurately provide any information that could assist with discharge planning. CSW engaged in conversation with Pt and assessed Pt's home situation and support system. Pt reported that prior to being admitted, she lived at home with her husband and the support of her daughter. Pt goes into detail and shares that her family assists with cooking, cleaning, transportation and medication management.Pt reported that she has assistive devices in the home such as a shower chair, bedside commode and a walker to assist with ambulation.  At the time of assessment, Pt reported that she does wear glasses although she did not have them on at the time of assessment.   At the time of assessment, pt was able to recall her name but needed assistance recalling the correct day and time. Pt is also unsure why she was admitted. Pt granted CSW permission to speak with her daughter to confirm information and to gain any insight that may assist with Pt's discharge plan.   CSW contacted pt's daughter at Ph: [phone] to confirm information provided by Pt. Pt's daughter, Debb, confirmed that she does provide assistance to her mother as needed. Pt's daughter added that there were accommodations added to the Pt's home such as handrails. Pt was recently discharged from the [facility] for rehab. Pt was then discharged home with HH/PT/ST/Nurse. Pt's daughter was unsure as to which agency provided these services. Pt's daughter was informed by CSW that the PT evaluation, once completed will provide a better idea as to what to expect for discharge needs.Pt's daughter expressed that she did not feel a SNF referral was needed  because her mother was "getting around good" and needed minimal assistance at home with toileting.   CSW contacted Vaughan Basta with [facility] to confirm if Pt was receiving services from that company. Vaughan Basta informed CSW that Pt discharged from their services on 08-21-2018.  CSW will continue to follow Pt for any discharge needs.   [place] [facility]  Clinical Social Worker  Ph: [phone]             Expected Discharge Plan: [place] Barriers to Discharge: No Barriers Identified   Patient Goals and CMS Choice Patient states their goals for this hospitalization and ongoing recovery are:: to return home with the support of her family  CMS Medicare.gov Compare Post Acute Care list provided to:: (awaiting PT evaluation to determine if SNF is necessary )    Expected Discharge Plan and Services Expected Discharge Plan: [place] In-house Referral: NA Discharge Planning Services: NA   Living arrangements for the past 2 months: Single Family Home(Pt completed several weeks of rehab at [facility] )                          Prior Living Arrangements/Services Living arrangements for the past 2 months: Single Family Home(Pt completed several weeks of rehab at [facility] ) Lives with:: Spouse Patient language and need for interpreter reviewed:: No Do you feel safe going back to the place where you live?: Yes      Need  for Family Participation in Patient Care: Yes (Comment)(Pt is a poor historian and has difficulty providing accurate information during time of assessment ) Care giver support system in place?: Yes (comment)   Criminal Activity/Legal Involvement Pertinent to Current Situation/Hospitalization: No - Comment as needed  Activities of Daily Living Home Assistive Devices/Equipment: Walker (specify type) ADL Screening (condition at time of admission) Patient's cognitive ability adequate to safely  complete daily activities?: Yes Is the patient deaf or have difficulty hearing?: No Does the patient have difficulty seeing, even when wearing glasses/contacts?: No Does the patient have difficulty concentrating, remembering, or making decisions?: Yes Patient able to express need for assistance with ADLs?: Yes Does the patient have difficulty dressing or bathing?: No Independently performs ADLs?: Yes (appropriate for developmental age) Does the patient have difficulty walking or climbing stairs?: No Weakness of Legs: Left Weakness of Arms/Hands: Left  Permission Sought/Granted Permission sought to share information with : Family Supports Permission granted to share information with : Yes, Verbal Permission Granted  Share Information with NAME: Andree Elk           Emotional Assessment Appearance:: Appears stated age Attitude/Demeanor/Rapport: Engaged Affect (typically observed): Accepting, Pleasant, Appropriate Orientation: : Oriented to Self(Pt used board to assist in recalling date and time )   Psych Involvement: No (comment)  Admission diagnosis:  Abnormal EKG [R94.31] Elevated troponin [R79.89] Left arm pain [M79.602] Hypertensive urgency [I16.0] Essential hypertension [I10] Angina at rest [facility]) [I20.8] Patient Active Problem List   Diagnosis Date Noted  . Arm pain, anterior, left 09/03/2018  . Angina at rest [facility]) 09/03/2018  . Abnormal EKG   . Elevated troponin   . Hypertensive urgency   . Acute left-sided weakness 05/07/2018  . Nonalcoholic hepatosteatosis 33/38/3291  . Acute CVA (cerebrovascular accident) ([place]) 05/07/2018  . Weakness 05/07/2018  . Unstable angina ([place]) 02/26/2018  . TIA (transient ischemic attack) 02/07/2011  . PFO (patent foramen ovale) 02/07/2011  . Type II diabetes mellitus ([place]) 09/30/2008  . CHANGE IN BOWELS 09/30/2008  . HYPERCHOLESTEROLEMIA 09/29/2008  . Essential hypertension 09/29/2008  . Coronary atherosclerosis 09/29/2008  .  VENTRICULAR HYPERTROPHY, LEFT 09/29/2008  . ALLERGIC RHINITIS, CHRONIC 09/29/2008  . OTHER CHRONIC NONALCOHOLIC LIVER DISEASE 91/66/0600  . LIVER FUNCTION TESTS, ABNORMAL, HX OF 09/29/2008  . COLONIC POLYPS, HX OF 09/29/2008  . GERD (gastroesophageal reflux disease) 09/29/2008   PCP:  Loman Brooklyn, FNP Pharmacy:   [place], [place] [address] [place] [address] Phone: [phone] Fax: [phone]  [facility] [place], [place] - [address] [place] [address] Phone: [phone] Fax: [phone]     Social Determinants of Health (SDOH) Interventions    Readmission Risk Interventions No flowsheet data found.

## 2018-09-03 NOTE — ED Notes (Signed)
Date and time results received: 09/03/18 0705   Test: Troponin Critical Value: 0.48  Name of Provider Notified: Rolland Porter MD

## 2018-09-03 NOTE — Progress Notes (Signed)
[place] for heparin dosing Indication: chest pain/ACS  Allergies  Allergen Reactions  . Ivp Dye [Iodinated Diagnostic Agents] Anaphylaxis  . Sulfonamide Derivatives Shortness Of Breath  . Lipitor [Atorvastatin Calcium]     Muscle Aches  . Zetia [Ezetimibe]     Muscle Aches  . Penicillins Rash    As a teenager. Has patient had a PCN reaction causing immediate rash, facial/tongue/throat swelling, SOB or lightheadedness with hypotension: No Has patient had a PCN reaction causing severe rash involving mucus membranes or skin necrosis: No Has patient had a PCN reaction that required hospitalization: No Has patient had a PCN reaction occurring within the last 10 years: No If all of the above answers are "NO", then may proceed with Cephalosporin use.     Patient Measurements: Height: 5' 2"  (157.5 cm) Weight: 154 lb 5.2 oz (70 kg) IBW/kg (Calculated) : 50.1 Heparin Dosing Weight: 65 kg  Vital Signs: Temp: 97.6 F (36.4 C) (03/31 1949) Temp Source: Oral (03/31 1949) BP: 156/71 (03/31 2026) Pulse Rate: 94 (03/31 1900)  Labs: Recent Labs    09/03/18 0156 09/03/18 0341 09/03/18 0628 09/03/18 1303 09/03/18 2050  HGB 14.0  --   --   --   --   HCT 43.6  --   --   --   --   PLT 250  --   --   --   --   APTT 33  --   --   --   --   LABPROT 14.2  --   --   --   --   INR 1.1  --   --   --   --   HEPARINUNFRC  --   --   --   --  1.24*  CREATININE 0.66  --   --   --   --   TROPONINI 0.43* 0.28* 0.48* 1.90*  --     Estimated Creatinine Clearance: 44.6 mL/min (by C-G formula based on SCr of 0.66 mg/dL).   Medical History: Past Medical History:  Diagnosis Date  . Allergic rhinitis   . Arthritis    "probably in my hands" (02/26/2018)  . Colonic polyp   . Coronary artery disease    a. s/p cath on 02/26/2018 with 3-vessel CAD with 95% proximal LAD, 90% distal RCA, and 70% OM1 stenosis.  She underwent successful PCI with DES x1 to the distal  RCA and DES x1 to the proximal LAD  . GERD (gastroesophageal reflux disease)   . History of blood transfusion    "don't remember why or when" (02/26/2018)  . Hyperlipidemia   . Hypertension   . Nonalcoholic hepatosteatosis   . TIA (transient ischemic attack) 2000s   "I've had ~ 3" (02/26/2018)  . Type II diabetes mellitus (HCC)     Medications:  Scheduled:  . [START ON 09/04/2018] aspirin  81 mg Oral Pre-Cath  . aspirin EC  81 mg Oral Daily  . Chlorhexidine Gluconate Cloth  6 each Topical Daily  . [START ON 09/04/2018] clopidogrel  75 mg Oral Q breakfast  . docusate sodium  100 mg Oral BID  . fluticasone  2 spray Each Nare Daily  . insulin aspart  0-9 Units Subcutaneous TID WC  . loratadine  10 mg Oral Daily  . losartan  25 mg Oral Daily  . metoprolol succinate  25 mg Oral Daily  . nitroGLYCERIN  1 inch Topical Once  . pantoprazole  40 mg Oral Daily  .  rosuvastatin  10 mg Oral q1800  . sodium chloride flush  3 mL Intravenous Q12H  . ursodiol  300 mg Oral BID   Infusions:  . sodium chloride    . [START ON 09/04/2018] sodium chloride     Followed by  . [START ON 09/04/2018] sodium chloride    . heparin 800 Units/hr (09/03/18 1800)   PRN: sodium chloride, acetaminophen **OR** acetaminophen, albuterol, haloperidol lactate, HYDROcodone-acetaminophen, ketorolac, levalbuterol, magnesium citrate, morphine injection, ondansetron **OR** ondansetron (ZOFRAN) IV, polyvinyl alcohol, senna-docusate, sodium chloride flush, sorbitol, traZODone  Assessment: Patient admitted with complaint of transient left arm pain. Hx of CVA with left sided weakness. Troponin 0.35>0.48>1.9. On apixaban PTA - LD unknown.  Heparin is running in the R wrist, level was drawn from L AC. Received bolus and infusion earlier today. Initial heparin level came back at 1.24 - likely falsely elevated due to apixaban. Ordered aPTT given possible recent last dose. Will monitor both concurrently until correlate.   Goal of  Therapy:  APTT: 66-102 s Heparin level 0.3-0.7 units/ml Monitor platelets by anticoagulation protocol: Yes   Plan:  Continue heparin infusion at 800 units/hr until aPTT results Check anti-Xa level and aPTT in 8 hours and daily while on heparin Continue to monitor H&H and platelets  Antonietta Jewel, PharmD, [place] Clinical Pharmacist  Pager: [phone] Phone: [phone] 09/03/2018,9:35 PM

## 2018-09-03 NOTE — Progress Notes (Signed)
Spoke to Dr. Leonel Ramsay with neuro regarding pt's BP and he recommended permissive HTN, systolic up to 641

## 2018-09-03 NOTE — Progress Notes (Addendum)
   Nurse called to notify that pt has arrived from [facility]. Chart reviewed. Cardiology team saw at [facility] for left arm pain and elevated troponin with initial plan for cath, but notes also document concern for transient dysarthria and intermittent confusion. Brain MRI subsequent to this decision demonstrated a late subacute to chronic infarct or other nonacute insult. Dr. Doristine Devoid note from 4:23pm indicates conversation with neurology for them to see patient after transfer. There are also notes indicating patient has had confusion/agitation and noncompliance with IV & cardiac leads this afternoon. She also has a dye allergy. Nurse will contact neurology as previously outlined in IM's notes - hopeful to have their input regarding BP as well in case permissive HTN is required.   Regarding plan for cath, given her mental status and ongoing neuro assessment, it seems necessary for our team to re-evaluate her in the morning before finalizing her readiness for cardiac cath (in case she is unable to be compliant with peri-/post-cath care instructions which could complicate her case). Furthermore I do not see any dye allergy prophylaxis ordered - I will hold off pending definitive assessment tomorrow by our team in case cath is deferred, as steroids could potentially exacerbate any underlying mental status changes.  I have sent a message to our on-call AM APP who comes in at [place] tomorrow to facilitate early rounding by MD to finalize plans. Relayed plan to nurse as well. Dayna Dunn PA-C

## 2018-09-03 NOTE — ED Provider Notes (Signed)
Blood pressure (!) 174/98, pulse (!) 52, temperature (!) 97.5 F (36.4 C), temperature source Oral, resp. rate 20, height 5' 2"  (1.575 m), weight 70.8 kg, SpO2 98 %.  Assuming care from Dr. Tomi Bamberger.  In short, [name] is a 85 y.o. female with a chief complaint of No chief complaint on file. Marland Kitchen  Refer to the original H&P for additional details.  The current plan of care is to f/u with Cardiology this AM.  08:40 AM  Spoke with Dr. Bronson Ing with Cardiology. Advises keeping the patient here at [facility] and they can assist the hospitalist with mgmt. Paged Hospitalist to update on plan to stay at [facility]. Patient remains symptom-free.    EKG Interpretation  Date/Time:  Tuesday September 03 2018 07:07:12 EDT Ventricular Rate:  77 PR Interval:    QRS Duration: 106 QT Interval:  436 QTC Calculation: 494 R Axis:   -35 Text Interpretation:  Sinus rhythm Probable left atrial enlargement Left axis deviation Nonspecific repol abnormality, diffuse leads Borderline prolonged QT interval Since last tracing 1 hr ago T wave inversion less evident in Inferolateral leads Confirmed by Rolland Porter [phone]) on 09/03/2018 7:14:35 AM          Long, Wonda Olds, MD 09/03/18 [phone]

## 2018-09-03 NOTE — Progress Notes (Signed)
Patient became more confused/agitated/fidgeting throughout the evening. Patient pulled out IV and pulled off cardiac leads and was resisting care from nursing staff. Advised Dr. Carles Collet of patient's increased agitation and noncompliance and 106m of haldol ordered/given.

## 2018-09-03 NOTE — Progress Notes (Addendum)
PROGRESS NOTE  [name] VWU:[id number] DOB: September 18, 1927 DOA: 09/03/2018 PCP: Loman Brooklyn, FNP  Brief History:  85 year old female with a history of stroke with residual left-sided weakness, diabetes mellitus type 2, hypertension, coronary artery disease, hyperlipidemia, NASH presented with 2 to 3-day history of left arm pain starting from her elbow radiating to her fingers.  The patient has had this left arm pain in the past dating back to July 2019.  The patient had an extensive cardiac work-up which resulted in heart catheterization performed on 02/26/2018 which showed three-vessel disease with placement of DES to the distal RCA and proximal LAD.  Subsequently, her left arm pain had resolved after the cardiac intervention.  The patient had an admission to the hospital in December 2019 where she was treated for an acute ischemic stroke to the right occipital lobe right frontal lobe and left occipital white matter.  The patient was placed on aspirin and Plavix at that time.  She has had residual left hemiparesis since that time. Unfortunately, the patient has cognitive impairment and is a poor historian.  History is supplemented by the patient's daughter on the telephone.  Daughter states that there has not been any recent fevers, chills, or influenza-like type symptoms.  Patient has been compliant with social distancing measures.  There have been no reports of fevers, chills, chest pain, nausea, vomiting, diarrhea.  There is no headache or sore throat.  The daughter related that the patient had brief episode of dysarthria when she woke up in the early morning 09/03/2018.  She stated that lasted a few minutes, but was uncertain if this was related to some confusion.  There is no worsening or new extremity weakness or visual disturbance.  Regarding her left arm pain, it had resolved after sublingual nitroglycerin.  In the emergency department, the patient was afebrile hemodynamically stable  saturating 93% on room air.  Patient was noted to have elevated troponins peaking at 0.48.  She was started on IV heparin.  BMP, LFTs were unremarkable.  WBC was 11.5 and hemoglobin 14.0.  Platelets 250,000.  BNP was 415.  D-dimer was 0.35.  Chest x-ray was negative for pulmonary edema or infiltrates.  Cardiology was consulted to assist with management.  Assessment/Plan: Left arm pain/ elevated troponin -Concerned about angina equivalent -Continue IV heparin -Cardiology consult -05/07/2018 echo EF 60-65%, no WMA, grade 1 DD, moderate AS -personally reviewed EKG--new TWI III, aVF and V5-V6  Coronary artery disease/elevated troponin -Cardiology consult as discussed above -02/26/2018 heart catheterization--three-vessel disease--> DES placed in the distal RCA, proximal LAD -LDL 143 -Increased dose of rosuvastatin judiciously due to interaction with plavix  Dysarthria -Daughter relates brief episode of dysarthria -Completely resolved -Repeat MRI brain  Accelerated hypertension -Patient presented like blood pressure up to 213/128 -Continue metoprolol succinate -Continue losartan  History of stroke -Patient had an acute ischemic stroke 05/2018 -She has remained on aspirin and Plavix -05/21/2018 event monitor--reviewed one episode of atrial fibrillation with RVR PSVT  Diabetes mellitus type 2 -Holding metformin -NovoLog sliding scale -05/07/2018 hemoglobin A1c 6.3 -Repeat hemoglobin A1c  Hyperlipidemia -LDL 143 -increase Crestor to 20 mg daily     Disposition Plan:   Pending cardiology eval Family Communication:   Daughter updated on phone  Consultants:  cardiology  Code Status:  FULL  DVT Prophylaxis:  IV Heparin    Procedures: As Listed in Progress Note Above  Antibiotics: None  Total time spent 35 minutes.  Greater than 50% spent face to face counseling and coordinating care.     Subjective: Patient denies fevers, chills, headache, chest pain, dyspnea,  nausea, vomiting, diarrhea, abdominal pain, dysuria, hematuria, hematochezia, and melena.   Objective: Vitals:   09/03/18 0530 09/03/18 0600 09/03/18 0605 09/03/18 0730  BP: (!) 187/100 (!) 184/95 (!) 141/69 (!) 174/98  Pulse: 83 84 (!) 52   Resp: (!) 23 17 (!) 23 20  Temp:      TempSrc:      SpO2: 97% 95% 98%   Weight:      Height:        Intake/Output Summary (Last 24 hours) at 09/03/2018 0803 Last data filed at 09/03/2018 9528 Gross per 24 hour  Intake --  Output 500 ml  Net -500 ml   Weight change:  Exam:   General:  Pt is alert, follows commands appropriately, not in acute distress  HEENT: No icterus, No thrush, No neck mass, [place]/AT  Cardiovascular: RRR, S1/S2, no rubs, no gallops  Respiratory: bibasilar crackles. No wheeze  Abdomen: Soft/+BS, non tender, non distended, no guarding  Extremities: No edema, No lymphangitis, No petechiae, No rashes, no synovitis  Neuro:  CN II-XII intact, strength 4/5 in RUE, RLE, strength 4-/5 LUE, LLE; sensation intact bilateral; no dysmetria; babinski equivocal     Data Reviewed: I have personally reviewed following labs and imaging studies Basic Metabolic Panel: Recent Labs  Lab 09/03/18 0156 09/03/18 0628  NA 141  --   K 4.2  --   CL 101  --   CO2 29  --   GLUCOSE 214*  --   BUN 15  --   CREATININE 0.66  --   CALCIUM 9.9 9.5  MG  --  1.4*  PHOS  --  3.6   Liver Function Tests: Recent Labs  Lab 09/03/18 0156  AST 20  ALT 11  ALKPHOS 136*  BILITOT 0.4  PROT 7.4  ALBUMIN 4.0   No results for input(s): LIPASE, AMYLASE in the last 168 hours. No results for input(s): AMMONIA in the last 168 hours. Coagulation Profile: Recent Labs  Lab 09/03/18 0156  INR 1.1   CBC: Recent Labs  Lab 09/03/18 0156  WBC 11.5*  NEUTROABS 8.5*  HGB 14.0  HCT 43.6  MCV 95.4  PLT 250   Cardiac Enzymes: Recent Labs  Lab 09/03/18 0156 09/03/18 0341 09/03/18 0628  TROPONINI 0.43* 0.28* 0.48*   BNP: Invalid  input(s): POCBNP CBG: Recent Labs  Lab 09/03/18 0134  GLUCAP 181*   HbA1C: No results for input(s): HGBA1C in the last 72 hours. Urine analysis:    Component Value Date/Time   COLORURINE STRAW (A) 05/07/2018 0213   APPEARANCEUR CLEAR 05/07/2018 0213   LABSPEC 1.005 05/07/2018 0213   PHURINE 7.0 05/07/2018 0213   GLUCOSEU NEGATIVE 05/07/2018 0213   HGBUR NEGATIVE 05/07/2018 0213   BILIRUBINUR NEGATIVE 05/07/2018 0213   KETONESUR NEGATIVE 05/07/2018 0213   PROTEINUR NEGATIVE 05/07/2018 0213   NITRITE NEGATIVE 05/07/2018 0213   LEUKOCYTESUR NEGATIVE 05/07/2018 0213   Sepsis Labs: @LABRCNTIP (procalcitonin:4,lacticidven:4) )No results found for this or any previous visit (from the past 240 hour(s)).   Scheduled Meds:  aspirin  81 mg Oral Daily   docusate sodium  100 mg Oral BID   fluticasone  2 spray Each Nare Daily   insulin aspart  0-9 Units Subcutaneous TID WC   loratadine  10 mg Oral Daily   metoprolol succinate  25 mg Oral Daily   nitroGLYCERIN  1 inch Topical Once   pantoprazole  40 mg Oral Daily   rosuvastatin  5 mg Oral q1800   ursodiol  300 mg Oral BID   Continuous Infusions:  sodium chloride     heparin 800 Units/hr (09/03/18 0741)    Procedures/Studies: Dg Chest 2 View  Result Date: 09/03/2018 CLINICAL DATA:  LEFT arm pain. EXAM: CHEST - 2 VIEW COMPARISON:  Chest radiograph June 04, 2013 FINDINGS: Cardiac silhouette is mildly enlarged. Mediastinal silhouette is normal. Mildly calcified aortic arch. Mild chronic interstitial changes. No pleural effusions or focal consolidations. Trachea projects midline and there is no pneumothorax. Soft tissue planes and included osseous structures are non-suspicious. IMPRESSION: 1. Mild cardiomegaly. No acute pulmonary process. 2. Aortic Atherosclerosis (ICD10-I70.0). Electronically Signed   By: Elon Alas M.D.   On: 09/03/2018 02:32   Ct Head Wo Contrast  Result Date: 09/03/2018 CLINICAL DATA:  LEFT  arm pain since yesterday. History of stroke and LEFT-sided weakness. EXAM: CT HEAD WITHOUT CONTRAST TECHNIQUE: Contiguous axial images were obtained from the base of the skull through the vertex without intravenous contrast. COMPARISON:  CT HEAD May 31, 2018 FINDINGS: Mild motion degraded examination. BRAIN: No intraparenchymal hemorrhage, mass effect nor midline shift. No parenchymal brain volume loss for age. No hydrocephalus. Confluent supratentorial white matter hypodensities. Old basal ganglia and thalami infarcts. No acute large vascular territory infarcts. No abnormal extra-axial fluid collections. Basal cisterns are patent. VASCULAR: Moderate calcific atherosclerosis of the carotid siphons. SKULL: No skull fracture. Osteopenia. No significant scalp soft tissue swelling. SINUSES/ORBITS: Maxillary mucosal retention cyst. Small RIGHT mastoid effusion.The included ocular globes and orbital contents are non-suspicious. Status post bilateral ocular lens implants. OTHER: None. IMPRESSION: 1. No acute intracranial process. 2. Stable examination including severe chronic small vessel ischemic changes and old lacunar infarcts. Electronically Signed   By: Elon Alas M.D.   On: 09/03/2018 02:16    Orson Eva, DO  [facility] Pager [phone]  If 7PM-7AM, please contact night-coverage [email or web address] Password [email or web address] 09/03/2018, 8:03 AM   LOS: 0 days

## 2018-09-03 NOTE — Progress Notes (Signed)
I called her husband and spoke with him regarding his wife's condition and informed him that I would be transferring her to [facility] for cardiac catheterization. He was in agreement and appreciated the update.

## 2018-09-04 ENCOUNTER — Inpatient Hospital Stay (HOSPITAL_COMMUNITY): Payer: Medicare Other

## 2018-09-04 ENCOUNTER — Encounter (HOSPITAL_COMMUNITY)
Admission: EM | Disposition: A | Discharge status: Home Health | Payer: Self-pay | Source: Home / Self Care | Attending: Cardiology

## 2018-09-04 DIAGNOSIS — I639 Cerebral infarction, unspecified: Secondary | ICD-10-CM

## 2018-09-04 DIAGNOSIS — E1165 Type 2 diabetes mellitus with hyperglycemia: Secondary | ICD-10-CM

## 2018-09-04 DIAGNOSIS — E7800 Pure hypercholesterolemia, unspecified: Secondary | ICD-10-CM

## 2018-09-04 DIAGNOSIS — I361 Nonrheumatic tricuspid (valve) insufficiency: Secondary | ICD-10-CM

## 2018-09-04 DIAGNOSIS — I340 Nonrheumatic mitral (valve) insufficiency: Secondary | ICD-10-CM

## 2018-09-04 LAB — HEPARIN LEVEL (UNFRACTIONATED): HEPARIN UNFRACTIONATED: 1.16 [IU]/mL — AB (ref 0.30–0.70)

## 2018-09-04 LAB — ECHOCARDIOGRAM COMPLETE
Height: 62 in
Weight: 2402.13 oz

## 2018-09-04 LAB — URINALYSIS, ROUTINE W REFLEX MICROSCOPIC
BILIRUBIN URINE: NEGATIVE
Glucose, UA: NEGATIVE mg/dL
Hgb urine dipstick: NEGATIVE
KETONES UR: NEGATIVE mg/dL
Leukocytes,Ua: NEGATIVE
Nitrite: NEGATIVE
Protein, ur: NEGATIVE mg/dL
Specific Gravity, Urine: 1.01 (ref 1.005–1.030)
pH: 8 (ref 5.0–8.0)

## 2018-09-04 LAB — GLUCOSE, CAPILLARY
Glucose-Capillary: 136 mg/dL — ABNORMAL HIGH (ref 70–99)
Glucose-Capillary: 121 mg/dL — ABNORMAL HIGH (ref 70–99)
Glucose-Capillary: 130 mg/dL — ABNORMAL HIGH (ref 70–99)
Glucose-Capillary: 146 mg/dL — ABNORMAL HIGH (ref 70–99)
Glucose-Capillary: 159 mg/dL — ABNORMAL HIGH (ref 70–99)

## 2018-09-04 LAB — APTT
aPTT: 66 seconds — ABNORMAL HIGH (ref 24–36)
aPTT: 69 seconds — ABNORMAL HIGH (ref 24–36)

## 2018-09-04 SURGERY — LEFT HEART CATH AND CORONARY ANGIOGRAPHY
Anesthesia: LOCAL

## 2018-09-04 MED ORDER — SODIUM CHLORIDE 0.9% FLUSH: 3.0000 mL | INTRAVENOUS | Status: DC | PRN

## 2018-09-04 MED ORDER — RESOURCE THICKENUP CLEAR PO POWD
ORAL | Status: DC | PRN
  Filled 2018-09-04: qty 125

## 2018-09-04 MED ORDER — ASPIRIN 81 MG PO CHEW
81.0000 mg | CHEWABLE_TABLET | ORAL | Status: AC
  Administered 2018-09-05: 81 mg via ORAL
  Filled 2018-09-04: qty 1

## 2018-09-04 MED ORDER — SODIUM CHLORIDE 0.9% FLUSH: 3.0000 mL | Freq: Two times a day (BID) | INTRAVENOUS | Status: DC

## 2018-09-04 MED ORDER — SODIUM CHLORIDE 0.9 % IV SOLN: 250.0000 mL | INTRAVENOUS | Status: DC | PRN

## 2018-09-04 MED ORDER — ROSUVASTATIN CALCIUM 20 MG PO TABS
20.0000 mg | ORAL_TABLET | Freq: Every day | ORAL | Status: DC
  Administered 2018-09-04 – 2018-09-05 (×2): 20 mg via ORAL
  Filled 2018-09-04 (×2): qty 1

## 2018-09-04 MED ORDER — SODIUM CHLORIDE 0.9 % IV SOLN
INTRAVENOUS | Status: DC
  Administered 2018-09-05: 05:00:00 via INTRAVENOUS

## 2018-09-04 NOTE — Procedures (Signed)
EEG Report  Clinical History:  Left side arm pain and dysarthria.  Technical Summary:  A 19 channel digital EEG recording was performed using the 10-20 international system of electrode placement.  Bipolar and Referential montages were used.  The total recording time was approx 20 minutes.  Findings:  There is a posterior dominant rhythm of 8 Hz reactive to eye opening and closure and symmetrical.  There is intermittent theta frequency slowing mainly in the right temporal area with some involvement of the right frontal and central areas. Photic stimulation did not elicit a driving response and did not elicit any further abnormalities.   Hyperventilation was not performed.   There were no epileptiform discharges or electrographic seizures present.  Sleep was not recorded.    Impression:  This is an abnormal EEG.  There is evidence of a cerebral dysfunction in the right frontotemporal area.   This is non-specific but may be due to stroke, tumor, inflammation, infection, or other local process.  Clinical correlation is recommended.    Rogue Jury, MS, MD

## 2018-09-04 NOTE — Progress Notes (Signed)
  Echocardiogram 2D Echocardiogram has been performed.  [name] 09/04/2018, 12:02 PM

## 2018-09-04 NOTE — Progress Notes (Signed)
Modified Barium Swallow Progress Note  Patient Details  Name: [name] MRN: [id number] Date of Birth: [birth year]  Today's Date: 09/04/2018  Modified Barium Swallow completed.  Full report located under Chart Review in the Imaging Section.  Brief recommendations include the following:  Clinical Impression  Pt has a mild-moderate oropharyngeal dysphagia primarily due to cognitive and sensory impairments. She has oral holding regardless of consistency, with Mod cues provided by SLP to attend to bolus and initiate posterior transit. Pt's swallow triggered at the pyriform sinuses with thin and nectar thick liquids, and although this occurred relatively quickly, if a large enough volume reached the pyriform sinuses before the swallow, she would intermittently and silently aspirate these consistencies. Attempts at modifying delivery method, bolus size, and postural strategies could not prevent penetration from occurring, which would sometimes reach as far as the true vocal folds. Even when more shallow, it would not clear with cued coughing. Pt's mentation would also likely make it difficulty for her to consistently follow commands for compensatory strategies. Pt had no airway invasion with honey thick liquids or solids. Recommend Dys 2 diet and honey thick liquids. SLP will f/u for tolerance and potential to advance.   Swallow Evaluation Recommendations       SLP Diet Recommendations: Dysphagia 2 (Fine chop) solids;Honey thick liquids   Liquid Administration via: Cup;Straw   Medication Administration: Crushed with puree   Supervision: Full supervision/cueing for compensatory strategies;Staff to assist with self feeding   Compensations: Minimize environmental distractions;Slow rate;Small sips/bites   Postural Changes: Seated upright at 90 degrees   Oral Care Recommendations: Oral care BID   Other Recommendations: Order thickener from pharmacy;Prohibited food (jello, ice cream, thin  soups);Remove water pitcher    Venita [name] 09/04/2018,3:53 PM   Pollyann Glen, M.A. [place] [facility] education officer [phone] Office [phone]

## 2018-09-04 NOTE — Progress Notes (Signed)
STROKE TEAM PROGRESS NOTE  INTERVAL HISTORY Patient is sitting comfortably in bed. She denies any neurological symptoms. She states she presented with left arm pain which has resolved with medication.  Vitals:   09/04/18 0122 09/04/18 0611 09/04/18 0818 09/04/18 0936  BP: (!) 156/83 (!) 155/119 133/64   Pulse:  (!) 115 79 100  Resp:  (!) 23    Temp:  97.7 F (36.5 C) 97.9 F (36.6 C)   TempSrc:  Oral Oral   SpO2:  96% 95%   Weight:  68.1 kg    Height:        CBC:  Recent Labs  Lab 09/03/18 0156  WBC 11.5*  NEUTROABS 8.5*  HGB 14.0  HCT 43.6  MCV 95.4  PLT 916    Basic Metabolic Panel:  Recent Labs  Lab 09/03/18 0156 09/03/18 0628  NA 141  --   K 4.2  --   CL 101  --   CO2 29  --   GLUCOSE 214*  --   BUN 15  --   CREATININE 0.66  --   CALCIUM 9.9 9.5  MG  --  1.4*  PHOS  --  3.6   Lipid Panel:     Component Value Date/Time   CHOL 227 (H) 09/03/2018 0628   TRIG 146 09/03/2018 0628   HDL 55 09/03/2018 0628   CHOLHDL 4.1 09/03/2018 0628   VLDL 29 09/03/2018 0628   LDLCALC 143 (H) 09/03/2018 0628   HgbA1c:  Lab Results  Component Value Date   HGBA1C 7.6 (H) 09/03/2018   Urine Drug Screen:     Component Value Date/Time   LABOPIA NONE DETECTED 05/07/2018 0213   COCAINSCRNUR NONE DETECTED 05/07/2018 0213   LABBENZ NONE DETECTED 05/07/2018 0213   AMPHETMU NONE DETECTED 05/07/2018 0213   THCU NONE DETECTED 05/07/2018 0213   LABBARB NONE DETECTED 05/07/2018 0213    Alcohol Level     Component Value Date/Time   ETH <10 05/07/2018 0258    IMAGING Dg Chest 2 View  Result Date: 09/03/2018 CLINICAL DATA:  LEFT arm pain. EXAM: CHEST - 2 VIEW COMPARISON:  Chest radiograph June 04, 2013 FINDINGS: Cardiac silhouette is mildly enlarged. Mediastinal silhouette is normal. Mildly calcified aortic arch. Mild chronic interstitial changes. No pleural effusions or focal consolidations. Trachea projects midline and there is no pneumothorax. Soft tissue planes  and included osseous structures are non-suspicious. IMPRESSION: 1. Mild cardiomegaly. No acute pulmonary process. 2. Aortic Atherosclerosis (ICD10-I70.0). Electronically Signed   By: Elon Alas M.D.   On: 09/03/2018 02:32   Ct Head Wo Contrast  Result Date: 09/03/2018 CLINICAL DATA:  LEFT arm pain since yesterday. History of stroke and LEFT-sided weakness. EXAM: CT HEAD WITHOUT CONTRAST TECHNIQUE: Contiguous axial images were obtained from the base of the skull through the vertex without intravenous contrast. COMPARISON:  CT HEAD May 31, 2018 FINDINGS: Mild motion degraded examination. BRAIN: No intraparenchymal hemorrhage, mass effect nor midline shift. No parenchymal brain volume loss for age. No hydrocephalus. Confluent supratentorial white matter hypodensities. Old basal ganglia and thalami infarcts. No acute large vascular territory infarcts. No abnormal extra-axial fluid collections. Basal cisterns are patent. VASCULAR: Moderate calcific atherosclerosis of the carotid siphons. SKULL: No skull fracture. Osteopenia. No significant scalp soft tissue swelling. SINUSES/ORBITS: Maxillary mucosal retention cyst. Small RIGHT mastoid effusion.The included ocular globes and orbital contents are non-suspicious. Status post bilateral ocular lens implants. OTHER: None. IMPRESSION: 1. No acute intracranial process. 2. Stable examination including severe chronic small vessel  ischemic changes and old lacunar infarcts. Electronically Signed   By: Elon Alas M.D.   On: 09/03/2018 02:16   Mr Jodene Nam Head Wo Contrast  Result Date: 09/03/2018 CLINICAL DATA:  LEFT arm pain. Follow-up LEFT temporal infarct. History of hypertension and hyperlipidemia. EXAM: MRA HEAD WITHOUT CONTRAST TECHNIQUE: Angiographic images of the Circle of Willis were obtained using MRA technique without intravenous contrast. COMPARISON:  MRA head May 07, 2018 FINDINGS: Mild motion degraded examination. ANTERIOR CIRCULATION: Flow  related enhancement of the included cervical, petrous, cavernous and supraclinoid internal carotid arteries. Moderate stenosis bilateral cavernous ICA's. Patent anterior communicating artery. Tandem signal loss LEFT > RIGHT ACA. Severe stenosis distal LEFT M1 segment. LEFT M2 segment occlusion and, moderate stenosis LEFT M2 segment. No large vessel occlusion, flow limiting stenosis, or aneurysm. POSTERIOR CIRCULATION: Codominant vertebral arteries. Vertebrobasilar arteries are patent, with normal flow related enhancement of the main branch vessels. Severe stenosis RIGHT P1 segment, occluded RIGHT PCA at P1 2 junction. Severe tandem stenosis LEFT PCA. No large vessel occlusion, flow limiting stenosis, or aneurysm. ANATOMIC VARIANTS: None. Source data and MIP images were reviewed. IMPRESSION: 1. Mild motion degraded examination. Chronically occluded RIGHT PCA and LEFT M2 segment. 2. Stable severe stenoses versus focal occlusions bilateral ACA and LEFT PCA. Severe stenosis LEFT M1 segment. 3. Moderate bilateral ICA stenosis. Electronically Signed   By: Elon Alas M.D.   On: 09/03/2018 22:28   Mr Brain Wo Contrast  Result Date: 09/03/2018 CLINICAL DATA:  Dysarthria. Left-sided weakness and confusion. Transient left arm pain. Accelerated hypertension. Diabetes. EXAM: MRI HEAD WITHOUT CONTRAST TECHNIQUE: Multiplanar, multiecho pulse sequences of the brain and surrounding structures were obtained without intravenous contrast. COMPARISON:  Head CT 09/03/2018 and MRI 05/07/2018 FINDINGS: Brain: There is new abnormal T2 hyperintensity in the mesial left temporal lobe involving the hippocampal head and anterior body with associated volume loss including ex vacuo dilatation of the left temporal horn. This area predominantly demonstrates facilitated diffusion, however there is a 5 mm focus of restricted diffusion. There is no diffusion abnormality involving the mesial right temporal lobe or other limbic structures.  Patchy to confluent T2 hyperintensities throughout the cerebral white matter bilaterally are similar to the prior MRI and nonspecific but compatible with severe chronic small vessel ischemic disease with multiple chronic white matter infarcts again seen. Patchy T2 hyperintensity in the pons has progressed with interval development of encephalomalacia on the right corresponding to an acute infarct on the prior MRI. A small chronic infarct in the left cerebellar hemisphere is unchanged. Chronic lacunar infarcts in the thalami have slightly increased in number. No intracranial hemorrhage, mass, midline shift, or extra-axial fluid collection is identified. Vascular: Major intracranial vascular flow voids are preserved. Skull and upper cervical spine: Unremarkable bone marrow signal. Sinuses/Orbits: Bilateral cataract extraction. Small right maxillary sinus mucous retention cyst. Large right mastoid effusion. Other: None. IMPRESSION: 1. New signal abnormality in the mesial left temporal lobe with associated volume loss suggesting a late subacute to chronic infarct or other nonacute insult. An associated 5 mm focus of restricted diffusion could reflect minimal superimposed acute ischemia or possibly the sequelae of recent seizure activity. No acute abnormality identified elsewhere to strongly suggest acute encephalitis. 2. Severe chronic small vessel ischemic disease with multiple chronic infarcts as above. Electronically Signed   By: Logan Bores M.D.   On: 09/03/2018 12:36    PHYSICAL EXAM Pleasant elderly Caucasian lady not in distress. . Afebrile. Head is nontraumatic. Neck is supple without bruit.  Cardiac exam no murmur or gallop. Lungs are clear to auscultation. Distal pulses are well felt. Neurological Exam :  Patient is drowsy but can be aroused. She is oriented to person only. She has diminished attention, registration and recall. She obese only simple commands and answers to her name.speech is  hypophonic and slightly dysarthric. Tongue is midline. Mild left lower facial weakness. Tongue midline. Motor system exam she does not follow commands consistently but is able to move right upper and lower extremity against gravity well. There appears to be mild weakness on the left side though she is not fully cooperative for detailed testing. Sensation and coordination cannot be reliably tested. Both plantars are downgoing. ASSESSMENT/PLAN [name] is a 85 y.o. female with history of HTN, HLD, DB, TIA presenting with L sided arm pain that resolved w/ NTG and transient dysarthria. MRI showed a chronic L temporal infarcts w/ superimposed small acute ischemia. This in setting of acute coronary syndrome.   Stroke:   Acute on chronic L temporal infarct in setting of acute coronary syndrome, likely embolic d/t ACS versus asymptomatic and transient worsening of dysarthria in the setting of medical stress.seizure can cause similar MRA abnormality but unlikely given the clinical history of presentation.  CT head No acute stroke. Severe small vessel disease. Old lacunes.   CXR mild CM. AA. No acute pulm process  MRI  New mesial L temporal lobe subacute to chronic infarct w/ 77m DWI positive area - ? Ischemia or result of sz. Severe SVD. Mult old infarcts.   MRA  Chronically occluded R PCA and L M2. Stable severe stenosis vs focal occlusions B ACA and L PCA. Severe stenosis L M1. Mod B ICA stenosis.   Carotid Doppler  pending   2D Echo pending   LDL 143  HgbA1c 7.6  IV heparin for VTE prophylaxis Diet Order            Diet NPO time specified Except for: Sips with Meds  Diet effective midnight              aspirin 81 mg daily and clopidogrel 75 mg daily prior to admission, now on aspirin 81 mg daily, clopidogrel 75 mg daily and heparin IV.   Therapy recommendations:  pending   Disposition:  pending   NSTEMI  Acute on chronic ischemia  CP resolved w/ normalized BP  Not a  candidate for intervention   Atrial Fibrillation  Home anticoagulation:  none   Now on IV heparin for ACS  Plan d/c on Eliquis and Plavix at this time   Likely Baseline Dementia   Confused and disoriented  UA ok  Hypertension  Stable . Ok for BP goal normotensive  Hyperlipidemia  Home meds:  crestor 5  Increased to crestor 20 in hospital  LDL 143, goal < 70  Continue statin at discharge  Diabetes type II, uncontrolled  HgbA1c 7.6, goal < 7.0  Other Stroke Risk Factors  Advanced age  Former Cigarette smoker, quit 26 yrs ago  Former smokeless tobacco user, quit 25 yrs ago  Overweight, Body mass index is 27.46 kg/m., recommend weight loss, diet and exercise as appropriate   Hx stroke/TIA  Pt reports hx TIA x 3.   05/2018 - Seen by Dr. DMerlene Laughter MRI w/ mult acute infarcts: patchy R occipital, R pontine, punctate posterior R frontal and L occipital white matter. Small vessel disease. MRA w/ high grade narrowing L M1, L A2 and R P2/3.  Coronary artery disease  s/p PCA, DES 02/2018. ACS this admission  Moderate AS  Reported hx PFO  Hx B ICA stenosis < 50% 05/2018  Hospital day # 1 I have personally obtained history,examined this patient, reviewed notes, independently viewed imaging studies, participated in medical decision making and plan of care.ROS completed by me personally and pertinent positives fully documented  I have made any additions or clarifications directly to the above note. Agree with note above. She presented with dysarthria unclear as to from the left hippocampal MRI abnormality which represents an acute stroke versus incidental finding. Her dysarthria could be aggravation of old deficits in the setting of acute medical illness .she has history of atrial fibrillation but has not been on anticoagulation. Regardless of acute stroke is cardioembolic are not given the fact that she has had multiple previous strokes and high [place] 2 vasc Score I  recommended eliquis and Plavix. Discontinue aspirin. Continue ongoing stroke workup. No family is available for discussion. Discussed with cardiology team. Greater than 50% time during this 35 minute visit was spent on counseling and coordination of care about her incidental small stroke, dysarthria, Atrial fibrillation and remote strokes and developing plan of care  Antony Contras, MD Medical Director [facility] Pager: [phone] 09/04/2018 1:16 PM   To contact Stroke Continuity provider, please refer to [email or web address] After hours, contact General Neurology

## 2018-09-04 NOTE — Evaluation (Signed)
Clinical/Bedside Swallow Evaluation Patient Details  Name: [name] MRN: [id number] Date of Birth: [birth year]  Today's Date: 09/04/2018 Time: SLP Start Time (ACUTE ONLY): 1210 SLP Stop Time (ACUTE ONLY): 1232 SLP Time Calculation (min) (ACUTE ONLY): 22 min  Past Medical History:  Past Medical History:  Diagnosis Date  . Allergic rhinitis   . Arthritis    "probably in my hands" (02/26/2018)  . Colonic polyp   . Coronary artery disease    a. s/p cath on 02/26/2018 with 3-vessel CAD with 95% proximal LAD, 90% distal RCA, and 70% OM1 stenosis.  She underwent successful PCI with DES x1 to the distal RCA and DES x1 to the proximal LAD  . GERD (gastroesophageal reflux disease)   . History of blood transfusion    "don't remember why or when" (02/26/2018)  . Hyperlipidemia   . Hypertension   . Nonalcoholic hepatosteatosis   . TIA (transient ischemic attack) 2000s   "I've had ~ 3" (02/26/2018)  . Type II diabetes mellitus ([place])    Past Surgical History:  Past Surgical History:  Procedure Laterality Date  . ABDOMINAL HYSTERECTOMY    . APPENDECTOMY  1950s  . CARDIAC CATHETERIZATION  2001   Nonobstructive CAD  . CATARACT EXTRACTION W/ INTRAOCULAR LENS  IMPLANT, BILATERAL Bilateral 2000s  . CORONARY STENT INTERVENTION N/A 02/26/2018   Procedure: CORONARY STENT INTERVENTION;  Surgeon: Martinique, Peter M, MD;  Location: [facility];  Service: Cardiovascular;  Laterality: N/A;  rca   . LEFT HEART CATH AND CORONARY ANGIOGRAPHY N/A 02/26/2018   Procedure: LEFT HEART CATH AND CORONARY ANGIOGRAPHY;  Surgeon: Martinique, Peter M, MD;  Location: [facility];  Service: Cardiovascular;  Laterality: N/A;  . TONSILLECTOMY  [id number]   HPI:  85 year old female admitted due to worsening left arm pain and brief episode of dysarthria. MRI on 3/31 shows new signal abnormality in mesial left temporal lobe with associated voume loss suggesting a late subacute to chronic infarct. PMH of stroke with residual  left-sided weakness, diabetes mellitus type 2, hypertension, coronary artery disease, hyperlipidemia, NASH, and GERD. MBS in 12/19 post CVA revealed mild oropharyngea dysphagia; Pt on dys 3 diet and nectar thick liquids due to silent aspiration on thin.    Assessment / Plan / Recommendation Clinical Impression  Pt was alert and cooperative. Demonstrated mild left facial droop. With PO trials, Pt demonstrated oral holding, prolonged mastication, and required frequent cues to swallow. Pt demonstrated intermittent coughing on larger sips of thin liquids from straw and cup. Given results of past MBS in Dec. 2019 and history of silent aspiration, recommend MBS to assess oropharyngeal swallow. Will make diet recommendations after completion of MBS. SLP Visit Diagnosis: Dysphagia, unspecified (R13.10)    Aspiration Risk  Moderate aspiration risk    Diet Recommendation NPO except meds;Ice chips PRN after oral care   Medication Administration: Whole meds with puree    Other  Recommendations Oral Care Recommendations: Oral care QID   Follow up Recommendations        Frequency and Duration            Prognosis Prognosis for Safe Diet Advancement: Good Barriers to Reach Goals: Cognitive deficits      Swallow Study   General Date of Onset: 09/03/18 HPI: 85 year old female admitted due to worsening left arm pain and brief episode of dysarthria. MRI on 3/31 shows new signal abnormality in mesial left temporal lobe with associated voume loss suggesting a late subacute to chronic infarct. PMH  of stroke with residual left-sided weakness, diabetes mellitus type 2, hypertension, coronary artery disease, hyperlipidemia, NASH, and GERD. MBS in 12/19 post CVA revealed mild oropharyngea dysphagia; Pt on dys 3 diet and nectar thick liquids due to silent aspiration on thin.  Type of Study: Bedside Swallow Evaluation Previous Swallow Assessment: see HPI Diet Prior to this Study: NPO Temperature Spikes Noted:  No Respiratory Status: Room air History of Recent Intubation: No Behavior/Cognition: Alert;Cooperative;Distractible;Requires cueing Oral Cavity Assessment: Within Functional Limits Oral Care Completed by SLP: No Oral Cavity - Dentition: Adequate natural dentition Vision: Functional for self-feeding Self-Feeding Abilities: Needs set up Patient Positioning: Upright in bed Baseline Vocal Quality: Normal Volitional Cough: Strong Volitional Swallow: Able to elicit    Oral/Motor/Sensory Function Overall Oral Motor/Sensory Function: Mild impairment Facial ROM: Reduced left Facial Symmetry: Abnormal symmetry left Facial Strength: Reduced left Lingual ROM: Within Functional Limits Lingual Symmetry: Within Functional Limits Velum: Within Functional Limits Mandible: Within Functional Limits   Ice Chips Ice chips: Impaired Presentation: Spoon Oral Phase Functional Implications: Prolonged oral transit   Thin Liquid Thin Liquid: Impaired Presentation: Cup;Self Fed;Straw Oral Phase Functional Implications: Oral holding(w/ sips from cup) Pharyngeal  Phase Impairments: Cough - Immediate;Cough - Delayed(w/ straw) Other Comments: tilted head back before swallowing sips from cup    Nectar Thick Nectar Thick Liquid: Not tested   Honey Thick Honey Thick Liquid: Not tested   Puree Puree: Impaired Presentation: Spoon;Self Fed Oral Phase Functional Implications: Oral holding;Prolonged oral transit   Solid     Solid: Within functional limits Presentation: Self Fed      Amanda Cockayne Kim 09/04/2018,1:58 PM  Melody Haver, M.Ed., [facility] [phone]

## 2018-09-04 NOTE — Progress Notes (Signed)
EEG complete. Results pending.  ?

## 2018-09-04 NOTE — Progress Notes (Signed)
[place] for Heparin dosing Indication: chest pain/ACS  Allergies  Allergen Reactions  . Ivp Dye [Iodinated Diagnostic Agents] Anaphylaxis  . Sulfonamide Derivatives Shortness Of Breath  . Lipitor [Atorvastatin Calcium]     Muscle Aches  . Zetia [Ezetimibe]     Muscle Aches  . Penicillins Rash    As a teenager. Has patient had a PCN reaction causing immediate rash, facial/tongue/throat swelling, SOB or lightheadedness with hypotension: No Has patient had a PCN reaction causing severe rash involving mucus membranes or skin necrosis: No Has patient had a PCN reaction that required hospitalization: No Has patient had a PCN reaction occurring within the last 10 years: No If all of the above answers are "NO", then may proceed with Cephalosporin use.     Patient Measurements: Height: 5' 2"  (157.5 cm) Weight: 150 lb 2.1 oz (68.1 kg) IBW/kg (Calculated) : 50.1 Heparin Dosing Weight: 65 kg  Vital Signs: Temp: 98.3 F (36.8 C) (04/01 1203) Temp Source: Oral (04/01 1203) BP: 150/93 (04/01 1203) Pulse Rate: 98 (04/01 1203)  Labs: Recent Labs    09/03/18 0156 09/03/18 0341 09/03/18 0628 09/03/18 1303 09/03/18 2050 09/03/18 2148 09/04/18 0411 09/04/18 1159  HGB 14.0  --   --   --   --   --   --   --   HCT 43.6  --   --   --   --   --   --   --   PLT 250  --   --   --   --   --   --   --   APTT 33  --   --   --   --  64* 66* 69*  LABPROT 14.2  --   --   --   --   --   --   --   INR 1.1  --   --   --   --   --   --   --   HEPARINUNFRC  --   --   --   --  1.24*  --  1.16*  --   CREATININE 0.66  --   --   --   --   --   --   --   TROPONINI 0.43* 0.28* 0.48* 1.90*  --   --   --   --     Estimated Creatinine Clearance: 44 mL/min (by C-G formula based on SCr of 0.66 mg/dL).   Medical History: Past Medical History:  Diagnosis Date  . Allergic rhinitis   . Arthritis    "probably in my hands" (02/26/2018)  . Colonic polyp   . Coronary  artery disease    a. s/p cath on 02/26/2018 with 3-vessel CAD with 95% proximal LAD, 90% distal RCA, and 70% OM1 stenosis.  She underwent successful PCI with DES x1 to the distal RCA and DES x1 to the proximal LAD  . GERD (gastroesophageal reflux disease)   . History of blood transfusion    "don't remember why or when" (02/26/2018)  . Hyperlipidemia   . Hypertension   . Nonalcoholic hepatosteatosis   . TIA (transient ischemic attack) 2000s   "I've had ~ 3" (02/26/2018)  . Type II diabetes mellitus (HCC)     Medications:  Scheduled:  .  stroke: mapping our early stages of recovery book   Does not apply Once  . aspirin  81 mg Oral Pre-Cath  . aspirin EC  81 mg Oral  Daily  . Chlorhexidine Gluconate Cloth  6 each Topical Daily  . clopidogrel  75 mg Oral Q breakfast  . docusate sodium  100 mg Oral BID  . fluticasone  2 spray Each Nare Daily  . insulin aspart  0-9 Units Subcutaneous TID WC  . loratadine  10 mg Oral Daily  . losartan  25 mg Oral Daily  . metoprolol succinate  25 mg Oral Daily  . nitroGLYCERIN  1 inch Topical Once  . pantoprazole  40 mg Oral Daily  . rosuvastatin  20 mg Oral q1800  . sodium chloride flush  3 mL Intravenous Q12H  . ursodiol  300 mg Oral BID   Infusions:  . sodium chloride    . sodium chloride    . heparin 900 Units/hr (09/04/18 6861)    Assessment: 85 yo female with NSTEMI and CVA. She is on apixaban PTA for afib. She is also noted on ASA and plavix with DES placed 02/2018.  Plans for echo and possible cath. Pharmacy dosing heparin. -Heparin level= 1.16, aPTT= 69  Goal of Therapy:  APTT: 66-85s Heparin level= 0.3-0.5 Monitor platelets by anticoagulation protocol: Yes   Plan:  -No heparin changes needed -Daily CBC and heparin level -Will follow plans for apixaban (noted likely plans to stop aspirin)  Hildred Laser, PharmD Clinical Pharmacist **Pharmacist phone directory can now be found on [email or web address] ([email or web address]).  Listed under [place].

## 2018-09-04 NOTE — Progress Notes (Signed)
Pharmacy is unable to confirm the medications the patient was taking at home. All options have been exhausted and a resolution to the situation is not expected.   Where possible, their outpatient pharmacy(s) have been contacted for the last time prescriptions were filled and that information has been added to each medication in an Order Note (highlighted yellow below the medication).  Please contact pharmacy if further assistance is needed.   Romeo Rabon, PharmD. Mobile: [phone]. 09/04/2018,4:18 PM.

## 2018-09-04 NOTE — TOC Progression Note (Signed)
Transition of Care [facility]) - Progression Note    Patient Details  Name: RONIYAH LLORENS MRN: [id number] Date of Birth: 31-Mar-1928  [facility] CM/SW Contact  Graves-Bigelow, Ocie Cornfield, RN Phone Number: 09/04/2018, 1:43 PM  Clinical Narrative: CM spoke with husband regarding patient needing therapy in the home. Patient previously was active with [facility] with RN,PT, SLP. Husband states patient was walking up the four steps to deck prior to hospitalization. PT recommends [facility] at this time. SLP following and patient is getting Barium Swallow 09-04-18. CM will continue to monitor for SLP needs for home. Husband wants to continue with [facility]- Referral called to [facility] and [facility] to begin within 24-48 hours post transition home. Per husband patient has a RW in the home. DME recommendations for WC and cushion. Pt will need [place] Orders and DME orders. CM will continue to follow for additional transition of care needs.        Expected Discharge Plan: [place] Barriers to Discharge: No Barriers Identified  Expected Discharge Plan and Services Expected Discharge Plan: [place] In-house Referral: NA Discharge Planning Services: CM Consult   Living arrangements for the past 2 months: Single Family Home(Pt completed several weeks of rehab at [facility] )                     [place] Arranged: RN, Disease Management, PT [place] Agency: [place] ([facility])   Social Determinants of Health (SDOH) Interventions    Readmission Risk Interventions No flowsheet data found.

## 2018-09-04 NOTE — Progress Notes (Signed)
[place] for Heparin dosing Indication: chest pain/ACS  Allergies  Allergen Reactions  . Ivp Dye [Iodinated Diagnostic Agents] Anaphylaxis  . Sulfonamide Derivatives Shortness Of Breath  . Lipitor [Atorvastatin Calcium]     Muscle Aches  . Zetia [Ezetimibe]     Muscle Aches  . Penicillins Rash    As a teenager. Has patient had a PCN reaction causing immediate rash, facial/tongue/throat swelling, SOB or lightheadedness with hypotension: No Has patient had a PCN reaction causing severe rash involving mucus membranes or skin necrosis: No Has patient had a PCN reaction that required hospitalization: No Has patient had a PCN reaction occurring within the last 10 years: No If all of the above answers are "NO", then may proceed with Cephalosporin use.     Patient Measurements: Height: 5' 2"  (157.5 cm) Weight: 154 lb 5.2 oz (70 kg) IBW/kg (Calculated) : 50.1 Heparin Dosing Weight: 65 kg  Vital Signs: Temp: 97.6 F (36.4 C) (03/31 1949) Temp Source: Oral (03/31 1949) BP: 156/83 (04/01 0122) Pulse Rate: 94 (03/31 1900)  Labs: Recent Labs    09/03/18 0156 09/03/18 0341 09/03/18 0628 09/03/18 1303 09/03/18 2050 09/03/18 2148 09/04/18 0411  HGB 14.0  --   --   --   --   --   --   HCT 43.6  --   --   --   --   --   --   PLT 250  --   --   --   --   --   --   APTT 33  --   --   --   --  64* 66*  LABPROT 14.2  --   --   --   --   --   --   INR 1.1  --   --   --   --   --   --   HEPARINUNFRC  --   --   --   --  1.24*  --   --   CREATININE 0.66  --   --   --   --   --   --   TROPONINI 0.43* 0.28* 0.48* 1.90*  --   --   --     Estimated Creatinine Clearance: 44.6 mL/min (by C-G formula based on SCr of 0.66 mg/dL).   Medical History: Past Medical History:  Diagnosis Date  . Allergic rhinitis   . Arthritis    "probably in my hands" (02/26/2018)  . Colonic polyp   . Coronary artery disease    a. s/p cath on 02/26/2018 with 3-vessel CAD  with 95% proximal LAD, 90% distal RCA, and 70% OM1 stenosis.  She underwent successful PCI with DES x1 to the distal RCA and DES x1 to the proximal LAD  . GERD (gastroesophageal reflux disease)   . History of blood transfusion    "don't remember why or when" (02/26/2018)  . Hyperlipidemia   . Hypertension   . Nonalcoholic hepatosteatosis   . TIA (transient ischemic attack) 2000s   "I've had ~ 3" (02/26/2018)  . Type II diabetes mellitus (HCC)     Medications:  Scheduled:  .  stroke: mapping our early stages of recovery book   Does not apply Once  . aspirin  81 mg Oral Pre-Cath  . aspirin EC  81 mg Oral Daily  . Chlorhexidine Gluconate Cloth  6 each Topical Daily  . clopidogrel  75 mg Oral Q breakfast  . docusate sodium  100 mg Oral BID  . fluticasone  2 spray Each Nare Daily  . insulin aspart  0-9 Units Subcutaneous TID WC  . loratadine  10 mg Oral Daily  . losartan  25 mg Oral Daily  . metoprolol succinate  25 mg Oral Daily  . nitroGLYCERIN  1 inch Topical Once  . pantoprazole  40 mg Oral Daily  . rosuvastatin  10 mg Oral q1800  . sodium chloride flush  3 mL Intravenous Q12H  . ursodiol  300 mg Oral BID   Infusions:  . sodium chloride    . sodium chloride     Followed by  . sodium chloride    . heparin 900 Units/hr (09/03/18 2313)   PRN: sodium chloride, acetaminophen **OR** acetaminophen, albuterol, haloperidol lactate, HYDROcodone-acetaminophen, ketorolac, levalbuterol, magnesium citrate, morphine injection, ondansetron **OR** ondansetron (ZOFRAN) IV, polyvinyl alcohol, senna-docusate, sodium chloride flush, sorbitol, traZODone  Assessment: Patient admitted with complaint of transient left arm pain. Hx of CVA with left sided weakness. Troponin 0.35>0.48>1.9. On apixaban PTA - LD unknown.  Heparin is running in the R wrist, level was drawn from L AC. Received bolus and infusion earlier today. Initial heparin level came back at 1.24 - likely falsely elevated due to apixaban.  Ordered aPTT given possible recent last dose. Will monitor both concurrently until correlate.   4/1 AM update: aPTT therapeutic this AM  Goal of Therapy:  APTT: 66-102 s Heparin level 0.3-0.7 units/ml Monitor platelets by anticoagulation protocol: Yes   Plan:  Cont heparin at 900 units/hr Confirmatory aPTT at 1200 Daily CBC/HL/aPTT Continue to monitor H&H and platelets  Narda Bonds, PharmD, BCPS Clinical Pharmacist Phone: [phone]

## 2018-09-04 NOTE — Evaluation (Signed)
Physical Therapy Evaluation Patient Details Name: [name] MRN: [id number] DOB: 11-28-1927 Today's Date: 09/04/2018   History of Present Illness  85 y.o. female with known coronary artery disease and percutaneous revascularization in September 2019 (DES to distal RCA and DES to proximal LAD), moderate aortic stenosis, hypertension, hyperlipidemia, type 2 diabetes mellitus, paroxysmal atrial fibrillation noted on previous 30-day event monitor, history of multiple TIAs and stroke in December 2019 now with acute on chronic ischemia in the same distribution, presents with complaints of left arm pain roughly 24 hours ago relieved with sublingual nitroglycerin.   Thought to have had NSTEMI.  MD states old right CVA.   Clinical Impression  Pt admitted with above diagnosis. Pt currently with functional limitations due to the deficits listed below (see PT Problem List). Pt was able to sit EOB but could not progress pt beyond that due to lethargy.  CM came to this PT and asked about home needs.  This PT concerned as pt couldn't do much this am due to lethargy.  Will send a therapist this pm as able to reassess pt as husband states she was walking at home with RW. May need wheelchair if pt cannot do more this pm.   Will follow acutely.   Pt will benefit from skilled PT to increase their independence and safety with mobility to allow discharge to the venue listed below.      Follow Up Recommendations Home health PT;Supervision/Assistance - 24 hour, HHOT, HHaide    Equipment Recommendations  Wheelchair (18x16);Wheelchair cushion (18x16 lightweight, anti-tippers, footrests, pressure relieving cushion)    Recommendations for Other Services       Precautions / Restrictions Precautions Precautions: Fall Restrictions Weight Bearing Restrictions: No      Mobility  Bed Mobility Overal bed mobility: Needs Assistance Bed Mobility: Supine to Sit     Supine to sit: Max assist     General bed mobility  comments: Took incr time and assist for LEs and elevation of trunk for pt to come to eOB.    Transfers                 General transfer comment: Pt needed assist to sit EOB as she had posterior lean at times.    Ambulation/Gait                Stairs            Wheelchair Mobility    Modified Rankin (Stroke Patients Only)       Balance Overall balance assessment: Needs assistance Sitting-balance support: Bilateral upper extremity supported;Feet supported Sitting balance-Leahy Scale: Poor Sitting balance - Comments: Needed assist to sit EOB for 5 min with min guard to min assist. Pt lethargy and decr abiliity to follow commands limited further treatment.                                      Pertinent Vitals/Pain Pain Assessment: No/denies pain    Home Living Family/patient expects to be discharged to:: Private residence Living Arrangements: Spouse/significant other Available Help at Discharge: Family;Available 24 hours/day Type of Home: House Home Access: Stairs to enter Entrance Stairs-Rails: Right;Left;Can reach both Entrance Stairs-Number of Steps: 4 Home Layout: One level Home Equipment: Cane - single point;Walker - 4 wheels;Toilet riser      Prior Function Level of Independence: Independent with assistive device(s)         Comments:  community ambulator using walker  per CM who called pts husband for information     Hand Dominance   Dominant Hand: Right    Extremity/Trunk Assessment   Upper Extremity Assessment Upper Extremity Assessment: Defer to OT evaluation    Lower Extremity Assessment Lower Extremity Assessment: LLE deficits/detail LLE Deficits / Details: grossly 2+/5 LLE Coordination: decreased gross motor    Cervical / Trunk Assessment Cervical / Trunk Assessment: Kyphotic  Communication   Communication: Other (comment)(Not very talkative)  Cognition Arousal/Alertness: Lethargic Behavior During Therapy:  Flat affect Overall Cognitive Status: History of cognitive impairments - at baseline Area of Impairment: Memory;Orientation;Attention;Following commands;Safety/judgement;Awareness;Problem solving                 Orientation Level: Disoriented to;Place;Time;Situation Current Attention Level: Focused   Following Commands: Follows one step commands inconsistently;Follows one step commands with increased time Safety/Judgement: Decreased awareness of safety;Decreased awareness of deficits   Problem Solving: Slow processing;Decreased initiation;Difficulty sequencing;Requires verbal cues;Requires tactile cues General Comments: Took incr time to arouse pt.  Even once aroused, pt not following commands.       General Comments      Exercises     Assessment/Plan    PT Assessment Patient needs continued PT services  PT Problem List Decreased balance;Decreased mobility;Decreased activity tolerance;Decreased knowledge of use of DME;Decreased safety awareness       PT Treatment Interventions DME instruction;Gait training;Functional mobility training;Therapeutic activities;Therapeutic exercise;Balance training;Stair training;Patient/family education    PT Goals (Current goals can be found in the Care Plan section)  Acute Rehab PT Goals Patient Stated Goal: unable to state PT Goal Formulation: Patient unable to participate in goal setting Time For Goal Achievement: 09/18/18 Potential to Achieve Goals: Good    Frequency Min 3X/week   Barriers to discharge        Co-evaluation               AM-PAC PT "6 Clicks" Mobility  Outcome Measure Help needed turning from your back to your side while in a flat bed without using bedrails?: A Lot Help needed moving from lying on your back to sitting on the side of a flat bed without using bedrails?: A Lot Help needed moving to and from a bed to a chair (including a wheelchair)?: A Lot Help needed standing up from a chair using your arms  (e.g., wheelchair or bedside chair)?: A Lot Help needed to walk in hospital room?: Total Help needed climbing 3-5 steps with a railing? : Total 6 Click Score: 10    End of Session Equipment Utilized During Treatment: Gait belt;Oxygen Activity Tolerance: Patient limited by fatigue Patient left: with call bell/phone within reach;in bed Nurse Communication: Mobility status PT Visit Diagnosis: Muscle weakness (generalized) (M62.81)    Time: 5909-3112 PT Time Calculation (min) (ACUTE ONLY): 13 min   Charges:   PT Evaluation $PT Eval Moderate Complexity: Calista Crain [place] Pager:  [phone]  Office:  [place] 09/04/2018, 1:05 PM

## 2018-09-04 NOTE — Progress Notes (Signed)
Progress Note  Patient Name: [name] Date of Encounter: 09/04/2018  Primary Cardiologist: Kate Sable, MD   Subjective   Lying in bed flat on her back, looks comfortable.  Eyes closed, but does open them on my request.  She says she feels fine and when specifically asked about chest pain says she is not having any.  She is oriented to herself but not to her location and circumstances.  She is wearing hand mitts to avoid pulling out her IV.  Inpatient Medications    Scheduled Meds:   stroke: mapping our early stages of recovery book   Does not apply Once   aspirin  81 mg Oral Pre-Cath   aspirin EC  81 mg Oral Daily   Chlorhexidine Gluconate Cloth  6 each Topical Daily   clopidogrel  75 mg Oral Q breakfast   docusate sodium  100 mg Oral BID   fluticasone  2 spray Each Nare Daily   insulin aspart  0-9 Units Subcutaneous TID WC   loratadine  10 mg Oral Daily   losartan  25 mg Oral Daily   metoprolol succinate  25 mg Oral Daily   nitroGLYCERIN  1 inch Topical Once   pantoprazole  40 mg Oral Daily   rosuvastatin  10 mg Oral q1800   sodium chloride flush  3 mL Intravenous Q12H   ursodiol  300 mg Oral BID   Continuous Infusions:  sodium chloride     sodium chloride     heparin 900 Units/hr (09/04/18 0600)   PRN Meds: sodium chloride, acetaminophen **OR** acetaminophen, albuterol, haloperidol lactate, HYDROcodone-acetaminophen, ketorolac, levalbuterol, magnesium citrate, morphine injection, ondansetron **OR** ondansetron (ZOFRAN) IV, polyvinyl alcohol, senna-docusate, sodium chloride flush, sorbitol, traZODone   Vital Signs    Vitals:   09/03/18 2327 09/04/18 0122 09/04/18 0611 09/04/18 0818  BP: (!) 122/98 (!) 156/83 (!) 155/119 133/64  Pulse:   (!) 115 79  Resp:   (!) 23   Temp:   97.7 F (36.5 C) 97.9 F (36.6 C)  TempSrc:   Oral Oral  SpO2:   96% 95%  Weight:   68.1 kg   Height:        Intake/Output Summary (Last 24 hours) at 09/04/2018  0845 Last data filed at 09/04/2018 0600 Gross per 24 hour  Intake 615.89 ml  Output 600 ml  Net 15.89 ml   Last 3 Weights 09/04/2018 09/03/2018 09/03/2018  Weight (lbs) 150 lb 2.1 oz 154 lb 5.2 oz 156 lb  Weight (kg) 68.1 kg 70 kg 70.761 kg      Telemetry    Sinus rhythm- Personally Reviewed  ECG    Sinus rhythm, T wave inversion leads V4-V6 and in the inferior leads- Personally Reviewed  Physical Exam  Asleep, but easy to awake, disoriented and confused GEN: No acute distress.   Neck: No JVD Cardiac: RRR, no murmurs, rubs, or gallops.  Respiratory: Clear to auscultation bilaterally. GI: Soft, nontender, non-distended  MS: No edema; No deformity. Neuro:  Nonfocal  Psych: Normal affect   Labs    Chemistry Recent Labs  Lab 09/03/18 0156 09/03/18 0628  NA 141  --   K 4.2  --   CL 101  --   CO2 29  --   GLUCOSE 214*  --   BUN 15  --   CREATININE 0.66  --   CALCIUM 9.9 9.5  PROT 7.4  --   ALBUMIN 4.0  --   AST 20  --  ALT 11  --   ALKPHOS 136*  --   BILITOT 0.4  --   GFRNONAA >60  --   GFRAA >60  --   ANIONGAP 11  --      Hematology Recent Labs  Lab 09/03/18 0156  WBC 11.5*  RBC 4.57  HGB 14.0  HCT 43.6  MCV 95.4  MCH 30.6  MCHC 32.1  RDW 12.9  PLT 250    Cardiac Enzymes Recent Labs  Lab 09/03/18 0156 09/03/18 0341 09/03/18 0628 09/03/18 1303  TROPONINI 0.43* 0.28* 0.48* 1.90*   No results for input(s): TROPIPOC in the last 168 hours.   BNP Recent Labs  Lab 09/03/18 0156 09/03/18 0341  BNP 463.0* 415.0*     DDimer  Recent Labs  Lab 09/03/18 0156  DDIMER 0.35     Radiology    Dg Chest 2 View  Result Date: 09/03/2018 CLINICAL DATA:  LEFT arm pain. EXAM: CHEST - 2 VIEW COMPARISON:  Chest radiograph June 04, 2013 FINDINGS: Cardiac silhouette is mildly enlarged. Mediastinal silhouette is normal. Mildly calcified aortic arch. Mild chronic interstitial changes. No pleural effusions or focal consolidations. Trachea projects  midline and there is no pneumothorax. Soft tissue planes and included osseous structures are non-suspicious. IMPRESSION: 1. Mild cardiomegaly. No acute pulmonary process. 2. Aortic Atherosclerosis (ICD10-I70.0). Electronically Signed   By: Elon Alas M.D.   On: 09/03/2018 02:32   Ct Head Wo Contrast  Result Date: 09/03/2018 CLINICAL DATA:  LEFT arm pain since yesterday. History of stroke and LEFT-sided weakness. EXAM: CT HEAD WITHOUT CONTRAST TECHNIQUE: Contiguous axial images were obtained from the base of the skull through the vertex without intravenous contrast. COMPARISON:  CT HEAD May 31, 2018 FINDINGS: Mild motion degraded examination. BRAIN: No intraparenchymal hemorrhage, mass effect nor midline shift. No parenchymal brain volume loss for age. No hydrocephalus. Confluent supratentorial white matter hypodensities. Old basal ganglia and thalami infarcts. No acute large vascular territory infarcts. No abnormal extra-axial fluid collections. Basal cisterns are patent. VASCULAR: Moderate calcific atherosclerosis of the carotid siphons. SKULL: No skull fracture. Osteopenia. No significant scalp soft tissue swelling. SINUSES/ORBITS: Maxillary mucosal retention cyst. Small RIGHT mastoid effusion.The included ocular globes and orbital contents are non-suspicious. Status post bilateral ocular lens implants. OTHER: None. IMPRESSION: 1. No acute intracranial process. 2. Stable examination including severe chronic small vessel ischemic changes and old lacunar infarcts. Electronically Signed   By: Elon Alas M.D.   On: 09/03/2018 02:16   Mr Jodene Nam Head Wo Contrast  Result Date: 09/03/2018 CLINICAL DATA:  LEFT arm pain. Follow-up LEFT temporal infarct. History of hypertension and hyperlipidemia. EXAM: MRA HEAD WITHOUT CONTRAST TECHNIQUE: Angiographic images of the Circle of Willis were obtained using MRA technique without intravenous contrast. COMPARISON:  MRA head May 07, 2018 FINDINGS: Mild  motion degraded examination. ANTERIOR CIRCULATION: Flow related enhancement of the included cervical, petrous, cavernous and supraclinoid internal carotid arteries. Moderate stenosis bilateral cavernous ICA's. Patent anterior communicating artery. Tandem signal loss LEFT > RIGHT ACA. Severe stenosis distal LEFT M1 segment. LEFT M2 segment occlusion and, moderate stenosis LEFT M2 segment. No large vessel occlusion, flow limiting stenosis, or aneurysm. POSTERIOR CIRCULATION: Codominant vertebral arteries. Vertebrobasilar arteries are patent, with normal flow related enhancement of the main branch vessels. Severe stenosis RIGHT P1 segment, occluded RIGHT PCA at P1 2 junction. Severe tandem stenosis LEFT PCA. No large vessel occlusion, flow limiting stenosis, or aneurysm. ANATOMIC VARIANTS: None. Source data and MIP images were reviewed. IMPRESSION: 1. Mild motion degraded examination. Chronically  occluded RIGHT PCA and LEFT M2 segment. 2. Stable severe stenoses versus focal occlusions bilateral ACA and LEFT PCA. Severe stenosis LEFT M1 segment. 3. Moderate bilateral ICA stenosis. Electronically Signed   By: Elon Alas M.D.   On: 09/03/2018 22:28   Mr Brain Wo Contrast  Result Date: 09/03/2018 CLINICAL DATA:  Dysarthria. Left-sided weakness and confusion. Transient left arm pain. Accelerated hypertension. Diabetes. EXAM: MRI HEAD WITHOUT CONTRAST TECHNIQUE: Multiplanar, multiecho pulse sequences of the brain and surrounding structures were obtained without intravenous contrast. COMPARISON:  Head CT 09/03/2018 and MRI 05/07/2018 FINDINGS: Brain: There is new abnormal T2 hyperintensity in the mesial left temporal lobe involving the hippocampal head and anterior body with associated volume loss including ex vacuo dilatation of the left temporal horn. This area predominantly demonstrates facilitated diffusion, however there is a 5 mm focus of restricted diffusion. There is no diffusion abnormality involving the  mesial right temporal lobe or other limbic structures. Patchy to confluent T2 hyperintensities throughout the cerebral white matter bilaterally are similar to the prior MRI and nonspecific but compatible with severe chronic small vessel ischemic disease with multiple chronic white matter infarcts again seen. Patchy T2 hyperintensity in the pons has progressed with interval development of encephalomalacia on the right corresponding to an acute infarct on the prior MRI. A small chronic infarct in the left cerebellar hemisphere is unchanged. Chronic lacunar infarcts in the thalami have slightly increased in number. No intracranial hemorrhage, mass, midline shift, or extra-axial fluid collection is identified. Vascular: Major intracranial vascular flow voids are preserved. Skull and upper cervical spine: Unremarkable bone marrow signal. Sinuses/Orbits: Bilateral cataract extraction. Small right maxillary sinus mucous retention cyst. Large right mastoid effusion. Other: None. IMPRESSION: 1. New signal abnormality in the mesial left temporal lobe with associated volume loss suggesting a late subacute to chronic infarct or other nonacute insult. An associated 5 mm focus of restricted diffusion could reflect minimal superimposed acute ischemia or possibly the sequelae of recent seizure activity. No acute abnormality identified elsewhere to strongly suggest acute encephalitis. 2. Severe chronic small vessel ischemic disease with multiple chronic infarcts as above. Electronically Signed   By: Logan Bores M.D.   On: 09/03/2018 12:36    Cardiac Studies   Echocardiogram May 07, 2018 - Left ventricle: The cavity size was normal. Wall thickness was   increased in a pattern of mild LVH. Systolic function was normal.   The estimated ejection fraction was in the range of 60% to 65%.   Wall motion was normal; there were no regional wall motion   abnormalities. Doppler parameters are consistent with abnormal   left  ventricular relaxation (grade 1 diastolic dysfunction). - Aortic valve: Moderately calcified annulus. Probably trileaflet;   moderately calcified leaflets. There was moderate stenosis. Mean   gradient (S): 11 mm Hg. Peak gradient (S): 21 mm Hg. VTI ratio of   LVOT to aortic valve: 0.44. Valve area (VTI): 1.24 cm^2. Valve   area (Vmax): 1.23 cm^2. - Mitral valve: Mildly to moderately calcified annulus. Mildly   thickened leaflets . There was trivial regurgitation. - Right atrium: Central venous pressure (est): 3 mm Hg. - Atrial septum: No defect or patent foramen ovale was identified. - Tricuspid valve: There was mild regurgitation. - Pulmonary arteries: PA peak pressure: 21 mm Hg (S). - Pericardium, extracardiac: A prominent pericardial fat pad was   present.  Patient Profile     85 y.o. female with known coronary artery disease and percutaneous revascularization in September 2019 (DES  to distal RCA and DES to proximal LAD), moderate aortic stenosis, hypertension, hyperlipidemia, type 2 diabetes mellitus, paroxysmal atrial fibrillation noted on previous 30-day event monitor, history of multiple TIAs and stroke in December 2019 now with acute on chronic ischemia in the same distribution, presents with complaints of left arm pain roughly 24 hours ago relieved with sublingual nitroglycerin.  Has new T wave inversion and mild elevation in cardiac troponin I.  Assessment & Plan    1. NSTEMI: She is currently not experiencing any chest pain.  She is on aspirin and clopidogrel and heparin.  When she had symptoms her blood pressure was severely elevated but now it has normalized and she is asymptomatic.  She is confused and disoriented and I do not think she is an appropriate candidate for invasive evaluation with coronary geography at this time.  Received the input from the neurology consultant, but would like some guidance regarding the safety of invasive cardiac procedures in the setting of what  appears to be acute on chronic cerebral ischemia. Check an echocardiogram.  If there is a sizable regional wall motion abnormality, particularly in the anterior distribution, may consider coronary geography, but otherwise I am afraid the risks exceed the potential benefits. 2. CVA: Appears less likely that this is related to atrial fibrillation since the ischemic injury is in the same territory as her previous stroke.  I am not sure to what degree her current confusion is related to dementia or the stroke, which is described a small.  Would like further guidance from the stroke service.   Neurology consultant suggested the possibility of a urine tract infection but her urinalysis is bland. She is currently n.p.o. pending her stroke swallow screen, which prevents [place] from reliably giving her antiplatelet agents. 3. AFib:  in the long run she should be on chronic anticoagulation, probably Eliquis and clopidogrel and stop aspirin. 4. HTN: Imroved control, prefer use of beta-blockers and other agents with antianginal benefit. 5. HLP: We will increase her dose of statin, but the minimal change in total and LDL cholesterol levels from December to March suggest that she may not be compliant with the medication. 6. AS: Physical exam does not suggest severe aortic stenosis.  It was moderate by echo in December 2019. 7. "IVP dye" allergy: As far as I can tell she did not receive premedication for her procedure in September did not have any allergic complications.  We will check on this further before we proceed to possible cardiac catheterization.  For questions or updates, please contact [place] Please consult [email or web address] for contact info under        Signed, Sanda Klein, MD  09/04/2018, 8:45 AM   '

## 2018-09-04 NOTE — Progress Notes (Signed)
Physical Therapy Treatment Patient Details Name: [name] MRN: [id number] DOB: [birth year] Today's Date: 09/04/2018    History of Present Illness 85 y.o. female with known coronary artery disease and percutaneous revascularization in September 2019 (DES to distal RCA and DES to proximal LAD), moderate aortic stenosis, hypertension, hyperlipidemia, type 2 diabetes mellitus, paroxysmal atrial fibrillation noted on previous 30-day event monitor, history of multiple TIAs and stroke in December 2019 now with acute on chronic ischemia in the same distribution, presents with complaints of left arm pain roughly 24 hours ago relieved with sublingual nitroglycerin.   Thought to have had NSTEMI.  MD states old right CVA.     PT Comments    Pt progressing towards goals, however, remains grossly unsteady during transfers. Pt with heavy posterior lean and required mod A +2 to transfer to chair. Feel SNF is most appropriate d/c location at this time given deficits, however, if pt or family refuses, will need max HH services at d/c. Will continue to follow acutely to maximize functional mobility independence and safety.   Follow Up Recommendations  SNF;Supervision/Assistance - 24 hour     Equipment Recommendations  Wheelchair (measurements PT);Wheelchair cushion (measurements PT)(18x16 lightweight, anti-tippers, footrests, pressure cushion)    Recommendations for Other Services       Precautions / Restrictions Precautions Precautions: Fall Restrictions Weight Bearing Restrictions: No    Mobility  Bed Mobility Overal bed mobility: Needs Assistance Bed Mobility: Supine to Sit     Supine to sit: Min assist;+2 for physical assistance     General bed mobility comments: Increased time and cues for sequencing. Min A +2 for trunk elevation.   Transfers Overall transfer level: Needs assistance Equipment used: 2 person hand held assist Transfers: Sit to/from [facility] Sit to  Stand: Mod assist;+2 physical assistance Stand pivot transfers: Mod assist;+2 physical assistance       General transfer comment: Mod A +2 for lift assist and steadying to stand. Mod A +2 for steadying to get to chair as pt with heavy posterior lean throughout. Cues for upright, however, pt unable to correct.   Ambulation/Gait                 Stairs             Wheelchair Mobility    Modified Rankin (Stroke Patients Only)       Balance Overall balance assessment: Needs assistance Sitting-balance support: No upper extremity supported;Feet supported Sitting balance-Leahy Scale: Fair     Standing balance support: Bilateral upper extremity supported;During functional activity Standing balance-Leahy Scale: Poor Standing balance comment: Reliant on external support to maintain standing balance.                             Cognition Arousal/Alertness: Lethargic Behavior During Therapy: Flat affect Overall Cognitive Status: History of cognitive impairments - at baseline                                 General Comments: Pt very sleepy at beginning of session, however, alertness increased as session progressed. Demonstrated slow processing.       Exercises General Exercises - Lower Extremity Long Arc Quad: AROM;Both;5 reps;Seated Hip Flexion/Marching: AROM;Seated;Both;5 reps(multimodal cues for technique)    General Comments        Pertinent Vitals/Pain Pain Assessment: Faces Faces Pain Scale: No hurt    Home  Living                      Prior Function            PT Goals (current goals can now be found in the care plan section) Acute Rehab PT Goals Patient Stated Goal: none stated PT Goal Formulation: Patient unable to participate in goal setting Time For Goal Achievement: 09/18/18 Potential to Achieve Goals: Good Progress towards PT goals: Progressing toward goals    Frequency    Min 3X/week      PT Plan  Discharge plan needs to be updated    Co-evaluation PT/OT/SLP Co-Evaluation/Treatment: Yes Reason for Co-Treatment: For patient/therapist safety;To address functional/ADL transfers;Necessary to address cognition/behavior during functional activity PT goals addressed during session: Mobility/safety with mobility;Balance        AM-PAC PT "6 Clicks" Mobility   Outcome Measure  Help needed turning from your back to your side while in a flat bed without using bedrails?: A Lot Help needed moving from lying on your back to sitting on the side of a flat bed without using bedrails?: A Lot Help needed moving to and from a bed to a chair (including a wheelchair)?: A Lot Help needed standing up from a chair using your arms (e.g., wheelchair or bedside chair)?: A Lot Help needed to walk in hospital room?: Total Help needed climbing 3-5 steps with a railing? : Total 6 Click Score: 10    End of Session Equipment Utilized During Treatment: Gait belt Activity Tolerance: Patient tolerated treatment well Patient left: in chair;with call bell/phone within reach;with chair alarm set;with nursing/sitter in room Nurse Communication: Mobility status PT Visit Diagnosis: Muscle weakness (generalized) (M62.81)     Time: 6047-9987 PT Time Calculation (min) (ACUTE ONLY): 19 min  Charges:  $Therapeutic Activity: 8-22 mins                     Leighton Ruff, PT, DPT  Acute Rehabilitation Services  Pager: [phone] Office: [phone]    Rudean Hitt 09/04/2018, 4:21 PM

## 2018-09-04 NOTE — Evaluation (Signed)
Occupational Therapy Evaluation Patient Details Name: [name] MRN: [id number] DOB: [birth year] Today's Date: 09/04/2018    History of Present Illness 85 y.o. female with known coronary artery disease and percutaneous revascularization in September 2019 (DES to distal RCA and DES to proximal LAD), moderate aortic stenosis, hypertension, hyperlipidemia, type 2 diabetes mellitus, paroxysmal atrial fibrillation noted on previous 30-day event monitor, history of multiple TIAs and stroke in December 2019 now with acute on chronic ischemia in the same distribution, presents with complaints of left arm pain roughly 24 hours ago relieved with sublingual nitroglycerin.   Thought to have had NSTEMI.  MD states old right CVA.    Clinical Impression   Pt PTA: living with spouse and caring for pt and per chart, pt was ambulating prior. Pt currently performing ADL with modA to maxA; and pt very lethargic upon arrival. Pt performing minimal bed mobility without modA+2 and modA+2 for stand pivot transfers from bed to recliner. Pt unable to state any PTA information. Pt performing tasks with increased assist at this time. Pt would greatly benefit from continued OT skilled services for ADL, mobility and safety in [facility] setting. OT to follow acutely.    Follow Up Recommendations  SNF;Supervision/Assistance - 24 hour    Equipment Recommendations  Other (comment)(to be determined)    Recommendations for Other Services       Precautions / Restrictions Precautions Precautions: Fall Restrictions Weight Bearing Restrictions: No      Mobility Bed Mobility Overal bed mobility: Needs Assistance Bed Mobility: Supine to Sit     Supine to sit: Max assist     General bed mobility comments: Took incr time and assist for LEs and elevation of trunk for pt to come to EOB.    Transfers Overall transfer level: Needs assistance Equipment used: 2 person hand held assist Transfers: Sit to/from [employer] Sit to Stand: Mod assist;+2 physical assistance Stand pivot transfers: Mod assist;+2 physical assistance       General transfer comment: Pt needed assist to sit EOB as she had posterior lean at times.      Balance Overall balance assessment: Needs assistance Sitting-balance support: Bilateral upper extremity supported;Feet supported Sitting balance-Leahy Scale: Poor Sitting balance - Comments: Needed assist to sit EOB for 5 min with min guard to min assist. Pt lethargy and decr abiliity to follow commands limited further treatment.    Standing balance support: Bilateral upper extremity supported;During functional activity Standing balance-Leahy Scale: Poor Standing balance comment: Reliant on external support to maintain standing balance.                            ADL either performed or assessed with clinical judgement   ADL Overall ADL's : Needs assistance/impaired Eating/Feeding: Moderate assistance;Sitting   Grooming: Moderate assistance;Sitting;Standing   Upper Body Bathing: Moderate assistance;Sitting   Lower Body Bathing: Maximal assistance;Sitting/lateral leans;Sit to/from stand   Upper Body Dressing : Moderate assistance;Sitting   Lower Body Dressing: Maximal assistance;Sitting/lateral leans;Sit to/from stand   Toilet Transfer: Moderate assistance;+2 for physical assistance;Stand-pivot   Toileting- Clothing Manipulation and Hygiene: Moderate assistance;+2 for physical assistance       Functional mobility during ADLs: Moderate assistance;+2 for physical assistance;+2 for safety/equipment(hand held due to mittens on as pt pulling out IV earlier.) General ADL Comments: Pt with increased fatigue adn inablity to properly perform ADL without mod to maxA     Vision Baseline Vision/History: No visual deficits Patient Visual  Report: No change from baseline Vision Assessment?: No apparent visual deficits     Perception     Praxis       Pertinent Vitals/Pain Pain Assessment: Faces Faces Pain Scale: No hurt     Hand Dominance Right   Extremity/Trunk Assessment Upper Extremity Assessment Upper Extremity Assessment: Generalized weakness;RUE deficits/detail;LUE deficits/detail RUE Deficits / Details: BUEs in mitts  3-/5 MM grade grossly. RUE Coordination: decreased gross motor LUE Deficits / Details: BUEs in mitts  3-/5 MM grade grossly.   Lower Extremity Assessment Lower Extremity Assessment: Defer to PT evaluation   Cervical / Trunk Assessment Cervical / Trunk Assessment: Kyphotic   Communication Communication Communication: Other (comment)(Not very talkative)   Cognition Arousal/Alertness: Lethargic Behavior During Therapy: Flat affect Overall Cognitive Status: History of cognitive impairments - at baseline Area of Impairment: Memory;Orientation;Attention;Following commands;Safety/judgement;Awareness;Problem solving                 Orientation Level: Disoriented to;Place;Time;Situation Current Attention Level: Focused   Following Commands: Follows one step commands inconsistently;Follows one step commands with increased time Safety/Judgement: Decreased awareness of safety;Decreased awareness of deficits   Problem Solving: Slow processing;Decreased initiation;Difficulty sequencing;Requires verbal cues;Requires tactile cues General Comments: Took incr time to arouse pt.  Even once aroused, pt not following commands.    General Comments  pt following commands and perked up enough to transfer into recliner.    Exercises Exercises: General Lower Extremity General Exercises - Lower Extremity Long Arc Quad: AROM;Both;5 reps;Seated Hip Flexion/Marching: AROM;Seated;Both;5 reps(multimodal cues for technique)   Shoulder Instructions      Home Living Family/patient expects to be discharged to:: Private residence Living Arrangements: Spouse/significant other Available Help at Discharge:  Family;Available 24 hours/day Type of Home: House Home Access: Stairs to enter [employer] of Steps: 4 Entrance Stairs-Rails: Right;Left;Can reach both Home Layout: One level     Bathroom Shower/Tub: Teacher, early years/pre: Standard Bathroom Accessibility: Yes   Home Equipment: Cane - single point;Walker - 4 wheels;Toilet riser          Prior Functioning/Environment Level of Independence: Independent with assistive device(s)        Comments: community ambulator using walker  per CM who called pts husband for information        OT Problem List: Decreased strength;Decreased activity tolerance;Impaired balance (sitting and/or standing);Impaired vision/perception;Decreased coordination;Decreased safety awareness;Decreased cognition;Increased edema      OT Treatment/Interventions: Self-care/ADL training;Therapeutic exercise;Neuromuscular education;Energy conservation;Therapeutic activities;Patient/family education;Balance training;Cognitive remediation/compensation    OT Goals(Current goals can be found in the care plan section) Acute Rehab OT Goals Patient Stated Goal: unable to state OT Goal Formulation: With patient Time For Goal Achievement: 09/18/18 Potential to Achieve Goals: Good ADL Goals Pt Will Perform Grooming: (P) with modified independence Pt Will Perform Upper Body Bathing: (P) with modified independence Pt Will Perform Lower Body Dressing: (P) with set-up Pt Will Transfer to Toilet: (P) with supervision Pt Will Perform Toileting - Clothing Manipulation and hygiene: (P) with supervision Additional ADL Goal #1: (P) Pt will perform ADL functional mobility and functional transfers with supervisionA  OT Frequency: Min 3X/week   Barriers to D/C:            Co-evaluation PT/OT/SLP Co-Evaluation/Treatment: Yes Reason for Co-Treatment: Complexity of the patient's impairments (multi-system involvement);Necessary to address cognition/behavior  during functional activity PT goals addressed during session: Mobility/safety with mobility;Balance OT goals addressed during session: ADL's and self-care      AM-PAC OT "6 Clicks" Daily Activity  Outcome Measure Help from another person eating meals?: Total Help from another person taking care of personal grooming?: Total Help from another person toileting, which includes using toliet, bedpan, or urinal?: Total Help from another person bathing (including washing, rinsing, drying)?: A Lot Help from another person to put on and taking off regular upper body clothing?: A Lot Help from another person to put on and taking off regular lower body clothing?: Total 6 Click Score: 8   End of Session Equipment Utilized During Treatment: Gait belt Nurse Communication: Mobility status  Activity Tolerance: Patient limited by lethargy;Patient tolerated treatment well Patient left: in chair;with call bell/phone within reach;with chair alarm set  OT Visit Diagnosis: Unsteadiness on feet (R26.81);Other abnormalities of gait and mobility (R26.89);Muscle weakness (generalized) (M62.81)                Time: 4099-2780 OT Time Calculation (min): 19 min Charges:  OT General Charges $OT Visit: 1 Visit OT Evaluation $OT Eval Moderate Complexity: 1 Mod  Darryl Nestle) Marsa Aris OTR/L Acute Rehabilitation Services Pager: [phone] Office: [phone]   Fredda Hammed 09/04/2018, 4:35 PM

## 2018-09-05 ENCOUNTER — Inpatient Hospital Stay (HOSPITAL_COMMUNITY): Payer: Medicare Other

## 2018-09-05 ENCOUNTER — Inpatient Hospital Stay (HOSPITAL_COMMUNITY)
Admission: EM | Disposition: A | Discharge status: Home Health | Payer: Self-pay | Source: Home / Self Care | Attending: Cardiology

## 2018-09-05 ENCOUNTER — Other Ambulatory Visit: Payer: Self-pay

## 2018-09-05 DIAGNOSIS — I2510 Atherosclerotic heart disease of native coronary artery without angina pectoris: Secondary | ICD-10-CM

## 2018-09-05 DIAGNOSIS — I214 Non-ST elevation (NSTEMI) myocardial infarction: Secondary | ICD-10-CM

## 2018-09-05 HISTORY — PX: LEFT HEART CATH AND CORONARY ANGIOGRAPHY: CATH118249

## 2018-09-05 HISTORY — PX: CORONARY STENT INTERVENTION: CATH118234

## 2018-09-05 LAB — BASIC METABOLIC PANEL
Anion gap: 11 (ref 5–15)
BUN: 10 mg/dL (ref 8–23)
CO2: 26 mmol/L (ref 22–32)
Calcium: 9.6 mg/dL (ref 8.9–10.3)
Chloride: 101 mmol/L (ref 98–111)
Creatinine, Ser: 0.64 mg/dL (ref 0.44–1.00)
GFR calc Af Amer: >60 mL/min (ref >60)
GFR calc non Af Amer: >60 mL/min (ref >60)
Glucose, Bld: 157 mg/dL — ABNORMAL HIGH (ref 70–99)
Potassium: 3 mmol/L — ABNORMAL LOW (ref 3.5–5.1)
Sodium: 138 mmol/L (ref 135–145)

## 2018-09-05 LAB — APTT: aPTT: 53 seconds — ABNORMAL HIGH (ref 24–36)

## 2018-09-05 LAB — POCT ACTIVATED CLOTTING TIME
Activated Clotting Time: 274 seconds
Activated Clotting Time: 345 seconds

## 2018-09-05 LAB — GLUCOSE, CAPILLARY
Glucose-Capillary: 153 mg/dL — ABNORMAL HIGH (ref 70–99)
Glucose-Capillary: 221 mg/dL — ABNORMAL HIGH (ref 70–99)
Glucose-Capillary: 251 mg/dL — ABNORMAL HIGH (ref 70–99)

## 2018-09-05 LAB — CBC
HCT: 41.2 % (ref 36.0–46.0)
Hemoglobin: 13.3 g/dL (ref 12.0–15.0)
MCH: 29.6 pg (ref 26.0–34.0)
MCHC: 32.3 g/dL (ref 30.0–36.0)
MCV: 91.6 fL (ref 80.0–100.0)
Platelets: 200 10*3/uL (ref 150–400)
RBC: 4.5 MIL/uL (ref 3.87–5.11)
RDW: 12.9 % (ref 11.5–15.5)
WBC: 9.5 10*3/uL (ref 4.0–10.5)
nRBC: 0 % (ref 0.0–0.2)

## 2018-09-05 LAB — HEPARIN LEVEL (UNFRACTIONATED): Heparin Unfractionated: 0.64 IU/mL (ref 0.30–0.70)

## 2018-09-05 SURGERY — LEFT HEART CATH AND CORONARY ANGIOGRAPHY
Anesthesia: LOCAL

## 2018-09-05 MED ORDER — HEPARIN (PORCINE) IN NACL 1000-0.9 UT/500ML-% IV SOLN
INTRAVENOUS | Status: DC | PRN
  Administered 2018-09-05 (×2): 500 mL

## 2018-09-05 MED ORDER — SODIUM CHLORIDE 0.9% FLUSH: 3.0000 mL | INTRAVENOUS | Status: DC | PRN

## 2018-09-05 MED ORDER — VERAPAMIL HCL 2.5 MG/ML IV SOLN
INTRAVENOUS | Status: AC
  Filled 2018-09-05: qty 2

## 2018-09-05 MED ORDER — HEPARIN SODIUM (PORCINE) 1000 UNIT/ML IJ SOLN
INTRAMUSCULAR | Status: DC | PRN
  Administered 2018-09-05: 2000 [IU] via INTRAVENOUS
  Administered 2018-09-05: 3500 [IU] via INTRAVENOUS
  Administered 2018-09-05: 5000 [IU] via INTRAVENOUS

## 2018-09-05 MED ORDER — HEPARIN SODIUM (PORCINE) 1000 UNIT/ML IJ SOLN
INTRAMUSCULAR | Status: AC
  Filled 2018-09-05: qty 1

## 2018-09-05 MED ORDER — CLOPIDOGREL BISULFATE 300 MG PO TABS
ORAL_TABLET | ORAL | Status: AC
  Filled 2018-09-05: qty 1

## 2018-09-05 MED ORDER — CLOPIDOGREL BISULFATE 75 MG PO TABS
75.0000 mg | ORAL_TABLET | Freq: Every day | ORAL | Status: DC
  Administered 2018-09-06: 75 mg via ORAL
  Filled 2018-09-05: qty 1

## 2018-09-05 MED ORDER — SODIUM CHLORIDE 0.9 % IV SOLN
INTRAVENOUS | Status: AC
  Administered 2018-09-05: 14:00:00 via INTRAVENOUS

## 2018-09-05 MED ORDER — VERAPAMIL HCL 2.5 MG/ML IV SOLN
INTRAVENOUS | Status: DC | PRN
  Administered 2018-09-05: 10 mL via INTRA_ARTERIAL

## 2018-09-05 MED ORDER — CLOPIDOGREL BISULFATE 300 MG PO TABS
ORAL_TABLET | ORAL | Status: DC | PRN
  Administered 2018-09-05: 300 mg via ORAL

## 2018-09-05 MED ORDER — SODIUM CHLORIDE 0.9 % IV SOLN: 250.0000 mL | INTRAVENOUS | Status: DC | PRN

## 2018-09-05 MED ORDER — IOHEXOL 350 MG/ML SOLN
INTRAVENOUS | Status: DC | PRN
  Administered 2018-09-05: 12:00:00 130 mL via INTRAVENOUS

## 2018-09-05 MED ORDER — SODIUM CHLORIDE 0.9% FLUSH
3.0000 mL | Freq: Two times a day (BID) | INTRAVENOUS | Status: DC
  Administered 2018-09-05: 3 mL via INTRAVENOUS

## 2018-09-05 MED ORDER — LIDOCAINE HCL (PF) 1 % IJ SOLN
INTRAMUSCULAR | Status: DC | PRN
  Administered 2018-09-05: 2 mL via INTRADERMAL

## 2018-09-05 MED ORDER — LIDOCAINE HCL (PF) 1 % IJ SOLN
INTRAMUSCULAR | Status: AC
  Filled 2018-09-05: qty 30

## 2018-09-05 MED ORDER — LORAZEPAM 2 MG/ML IJ SOLN
1.0000 mg | Freq: Once | INTRAMUSCULAR | Status: AC
  Administered 2018-09-05: 1 mg via INTRAVENOUS
  Filled 2018-09-05: qty 1

## 2018-09-05 MED ORDER — HYDRALAZINE HCL 20 MG/ML IJ SOLN: 10.0000 mg | INTRAMUSCULAR | Status: AC | PRN

## 2018-09-05 MED ORDER — LABETALOL HCL 5 MG/ML IV SOLN: 10.0000 mg | INTRAVENOUS | Status: AC | PRN

## 2018-09-05 MED ORDER — POTASSIUM CHLORIDE CRYS ER 20 MEQ PO TBCR
60.0000 meq | EXTENDED_RELEASE_TABLET | Freq: Once | ORAL | Status: AC
  Administered 2018-09-05: 60 meq via ORAL
  Filled 2018-09-05: qty 3

## 2018-09-05 MED ORDER — HYDRALAZINE HCL 20 MG/ML IJ SOLN
INTRAMUSCULAR | Status: DC | PRN
  Administered 2018-09-05: 10 mg via INTRAVENOUS

## 2018-09-05 MED ORDER — METHYLPREDNISOLONE SODIUM SUCC 125 MG IJ SOLR
125.0000 mg | Freq: Once | INTRAMUSCULAR | Status: AC
  Administered 2018-09-05: 125 mg via INTRAVENOUS
  Filled 2018-09-05: qty 2

## 2018-09-05 MED ORDER — POTASSIUM CHLORIDE CRYS ER 20 MEQ PO TBCR
40.0000 meq | EXTENDED_RELEASE_TABLET | Freq: Once | ORAL | Status: AC
  Administered 2018-09-05: 40 meq via ORAL
  Filled 2018-09-05: qty 2

## 2018-09-05 MED ORDER — HEPARIN (PORCINE) IN NACL 1000-0.9 UT/500ML-% IV SOLN
INTRAVENOUS | Status: AC
  Filled 2018-09-05: qty 1000

## 2018-09-05 MED ORDER — NITROGLYCERIN 1 MG/10 ML FOR IR/CATH LAB
INTRA_ARTERIAL | Status: AC
  Filled 2018-09-05: qty 10

## 2018-09-05 MED ORDER — HYDRALAZINE HCL 20 MG/ML IJ SOLN
INTRAMUSCULAR | Status: AC
  Filled 2018-09-05: qty 1

## 2018-09-05 MED ORDER — DIPHENHYDRAMINE HCL 50 MG/ML IJ SOLN
25.0000 mg | Freq: Once | INTRAMUSCULAR | Status: AC
  Administered 2018-09-05: 25 mg via INTRAVENOUS
  Filled 2018-09-05: qty 1

## 2018-09-05 SURGICAL SUPPLY — 29 items
BALLN SAPPHIRE 2.0X12 (BALLOONS) ×2
BALLN SAPPHIRE 2.5X12 (BALLOONS) ×2
BALLN SAPPHIRE ~~LOC~~ 3.25X8 (BALLOONS) ×1 IMPLANT
BALLN ~~LOC~~ EUPHORA RX 2.75X8 (BALLOONS) ×2
BALLOON SAPPHIRE 2.0X12 (BALLOONS) IMPLANT
BALLOON SAPPHIRE 2.5X12 (BALLOONS) IMPLANT
BALLOON ~~LOC~~ EUPHORA RX 2.75X8 (BALLOONS) IMPLANT
CATH INFINITI 5 FR AR1 MOD (CATHETERS) ×1 IMPLANT
CATH INFINITI 5 FR JL3.5 (CATHETERS) ×1 IMPLANT
CATH INFINITI 5FR ANG PIGTAIL (CATHETERS) ×1 IMPLANT
CATH LAUNCHER 5F RADR (CATHETERS) IMPLANT
CATH VISTA GUIDE 6FR JL3.5 (CATHETERS) ×1 IMPLANT
CATH VISTA GUIDE 6FR XBLAD3.5 (CATHETERS) ×1 IMPLANT
CATHETER LAUNCHER 5F RADR (CATHETERS) ×2
COVER DOME SNAP 22 D (MISCELLANEOUS) ×1 IMPLANT
DEVICE RAD COMP TR BAND LRG (VASCULAR PRODUCTS) ×1 IMPLANT
GLIDESHEATH SLEND SS 6F .021 (SHEATH) ×1 IMPLANT
GUIDEWIRE INQWIRE 1.5J.035X260 (WIRE) IMPLANT
INQWIRE 1.5J .035X260CM (WIRE) ×2
KIT ENCORE 26 ADVANTAGE (KITS) ×1 IMPLANT
KIT HEART LEFT (KITS) ×2 IMPLANT
PACK CARDIAC CATHETERIZATION (CUSTOM PROCEDURE TRAY) ×2 IMPLANT
SHEATH PROBE COVER 6X72 (BAG) IMPLANT
STENT SYNERGY DES 2.75X12 (Permanent Stent) ×1 IMPLANT
STENT SYNERGY DES 3X12 (Permanent Stent) ×1 IMPLANT
TRANSDUCER W/STOPCOCK (MISCELLANEOUS) ×2 IMPLANT
TUBING CIL FLEX 10 FLL-RA (TUBING) ×2 IMPLANT
WIRE COUGAR XT STRL 190CM (WIRE) ×1 IMPLANT
WIRE HI TORQ VERSACORE-J 145CM (WIRE) ×1 IMPLANT

## 2018-09-05 NOTE — Progress Notes (Signed)
PT Cancellation Note  Patient Details Name: [name] MRN: [id number] DOB: 1928-04-02   Cancelled Treatment:    Reason Eval/Treat Not Completed: Patient at procedure or test/unavailable(Pt in CA[facility] lab. Will return as able )   Denice Paradise 09/05/2018, 11:39 AM [place] Pager:  [phone]  Office:  [phone]

## 2018-09-05 NOTE — Progress Notes (Addendum)
Rt Radial TR band intact. Removed 3 cc air. No s/s bleed or hematoma. RP 2 +. Pt agitated post cath. Not following commands. Roby Lofts, PA notified. Order received for sitter. Dr Vena Rua notified. Requests call back in 1 hour if remains agitated.

## 2018-09-05 NOTE — H&P (View-Only) (Signed)
Progress Note  Patient Name: [name] Date of Encounter: 09/05/2018  Primary Cardiologist: Kate Sable, MD   Subjective   Calm, lying relaxed in bed. Opens eyes to voice and acknowledges her name. Denies angina and dyspnea. Disoriented to place ands time.  Inpatient Medications    Scheduled Meds:   stroke: mapping our early stages of recovery book   Does not apply Once   aspirin EC  81 mg Oral Daily   Chlorhexidine Gluconate Cloth  6 each Topical Daily   clopidogrel  75 mg Oral Q breakfast   diphenhydrAMINE  25 mg Intravenous Once   docusate sodium  100 mg Oral BID   fluticasone  2 spray Each Nare Daily   insulin aspart  0-9 Units Subcutaneous TID WC   loratadine  10 mg Oral Daily   losartan  25 mg Oral Daily   methylPREDNISolone sodium succinate  125 mg Intravenous Once   metoprolol succinate  25 mg Oral Daily   nitroGLYCERIN  1 inch Topical Once   pantoprazole  40 mg Oral Daily   rosuvastatin  20 mg Oral q1800   sodium chloride flush  3 mL Intravenous Q12H   ursodiol  300 mg Oral BID   Continuous Infusions:  sodium chloride     sodium chloride 10 mL/hr at 09/05/18 0528   sodium chloride     heparin 900 Units/hr (09/04/18 0952)   PRN Meds: sodium chloride, acetaminophen **OR** acetaminophen, albuterol, haloperidol lactate, HYDROcodone-acetaminophen, ketorolac, levalbuterol, magnesium citrate, morphine injection, ondansetron **OR** ondansetron (ZOFRAN) IV, polyvinyl alcohol, Resource ThickenUp Clear, senna-docusate, sodium chloride flush, sorbitol, traZODone   Vital Signs    Vitals:   09/04/18 2021 09/05/18 0020 09/05/18 0207 09/05/18 0636  BP: (!) 181/84 (!) 153/58  (!) 155/73  Pulse: 97   70  Resp: 20   (!) 22  Temp: 99.3 F (37.4 C)   97.7 F (36.5 C)  TempSrc: Oral     SpO2: 96%   96%  Weight:   68.3 kg   Height:        Intake/Output Summary (Last 24 hours) at 09/05/2018 0750 Last data filed at 09/05/2018 8003 Gross per 24  hour  Intake 279.71 ml  Output 475 ml  Net -195.29 ml   Filed Weights   09/03/18 1705 09/04/18 0611 09/05/18 0207  Weight: 70 kg 68.1 kg 68.3 kg    Telemetry    Sinus tachycardia this AM, now NSR - Personally Reviewed  ECG    No new tracing - Personally Reviewed  Physical Exam  Elderly. Calm. GEN: No acute distress.   Neck: No JVD, no carotid bruits Cardiac:  RRR, no murmurs, rubs, or gallops.  Respiratory: Clear to auscultation bilaterally, no wheezes/ rales/ rhonchi GI: NABS, Soft, nontender, non-distended  MS: No edema; No deformity. Neuro:  Nonfocal, moving all extremities spontaneously Psych: Normal affect   Labs    Chemistry Recent Labs  Lab 09/03/18 0156 09/03/18 0628 09/05/18 0444  NA 141  --  138  K 4.2  --  3.0*  CL 101  --  101  CO2 29  --  26  GLUCOSE 214*  --  157*  BUN 15  --  10  CREATININE 0.66  --  0.64  CALCIUM 9.9 9.5 9.6  PROT 7.4  --   --   ALBUMIN 4.0  --   --   AST 20  --   --   ALT 11  --   --  ALKPHOS 136*  --   --   BILITOT 0.4  --   --   GFRNONAA >60  --  >60  GFRAA >60  --  >60  ANIONGAP 11  --  11     Hematology Recent Labs  Lab 09/03/18 0156 09/05/18 0444  WBC 11.5* 9.5  RBC 4.57 4.50  HGB 14.0 13.3  HCT 43.6 41.2  MCV 95.4 91.6  MCH 30.6 29.6  MCHC 32.1 32.3  RDW 12.9 12.9  PLT 250 200    Cardiac Enzymes Recent Labs  Lab 09/03/18 0156 09/03/18 0341 09/03/18 0628 09/03/18 1303  TROPONINI 0.43* 0.28* 0.48* 1.90*   No results for input(s): TROPIPOC in the last 168 hours.   BNP Recent Labs  Lab 09/03/18 0156 09/03/18 0341  BNP 463.0* 415.0*     DDimer  Recent Labs  Lab 09/03/18 0156  DDIMER 0.35     Radiology    Mr Jodene Nam Head Wo Contrast  Result Date: 09/03/2018 CLINICAL DATA:  LEFT arm pain. Follow-up LEFT temporal infarct. History of hypertension and hyperlipidemia. EXAM: MRA HEAD WITHOUT CONTRAST TECHNIQUE: Angiographic images of the Circle of Willis were obtained using MRA technique  without intravenous contrast. COMPARISON:  MRA head May 07, 2018 FINDINGS: Mild motion degraded examination. ANTERIOR CIRCULATION: Flow related enhancement of the included cervical, petrous, cavernous and supraclinoid internal carotid arteries. Moderate stenosis bilateral cavernous ICA's. Patent anterior communicating artery. Tandem signal loss LEFT > RIGHT ACA. Severe stenosis distal LEFT M1 segment. LEFT M2 segment occlusion and, moderate stenosis LEFT M2 segment. No large vessel occlusion, flow limiting stenosis, or aneurysm. POSTERIOR CIRCULATION: Codominant vertebral arteries. Vertebrobasilar arteries are patent, with normal flow related enhancement of the main branch vessels. Severe stenosis RIGHT P1 segment, occluded RIGHT PCA at P1 2 junction. Severe tandem stenosis LEFT PCA. No large vessel occlusion, flow limiting stenosis, or aneurysm. ANATOMIC VARIANTS: None. Source data and MIP images were reviewed. IMPRESSION: 1. Mild motion degraded examination. Chronically occluded RIGHT PCA and LEFT M2 segment. 2. Stable severe stenoses versus focal occlusions bilateral ACA and LEFT PCA. Severe stenosis LEFT M1 segment. 3. Moderate bilateral ICA stenosis. Electronically Signed   By: Elon Alas M.D.   On: 09/03/2018 22:28   Mr Brain Wo Contrast  Result Date: 09/03/2018 CLINICAL DATA:  Dysarthria. Left-sided weakness and confusion. Transient left arm pain. Accelerated hypertension. Diabetes. EXAM: MRI HEAD WITHOUT CONTRAST TECHNIQUE: Multiplanar, multiecho pulse sequences of the brain and surrounding structures were obtained without intravenous contrast. COMPARISON:  Head CT 09/03/2018 and MRI 05/07/2018 FINDINGS: Brain: There is new abnormal T2 hyperintensity in the mesial left temporal lobe involving the hippocampal head and anterior body with associated volume loss including ex vacuo dilatation of the left temporal horn. This area predominantly demonstrates facilitated diffusion, however there is a 5  mm focus of restricted diffusion. There is no diffusion abnormality involving the mesial right temporal lobe or other limbic structures. Patchy to confluent T2 hyperintensities throughout the cerebral white matter bilaterally are similar to the prior MRI and nonspecific but compatible with severe chronic small vessel ischemic disease with multiple chronic white matter infarcts again seen. Patchy T2 hyperintensity in the pons has progressed with interval development of encephalomalacia on the right corresponding to an acute infarct on the prior MRI. A small chronic infarct in the left cerebellar hemisphere is unchanged. Chronic lacunar infarcts in the thalami have slightly increased in number. No intracranial hemorrhage, mass, midline shift, or extra-axial fluid collection is identified. Vascular: Major intracranial vascular flow  voids are preserved. Skull and upper cervical spine: Unremarkable bone marrow signal. Sinuses/Orbits: Bilateral cataract extraction. Small right maxillary sinus mucous retention cyst. Large right mastoid effusion. Other: None. IMPRESSION: 1. New signal abnormality in the mesial left temporal lobe with associated volume loss suggesting a late subacute to chronic infarct or other nonacute insult. An associated 5 mm focus of restricted diffusion could reflect minimal superimposed acute ischemia or possibly the sequelae of recent seizure activity. No acute abnormality identified elsewhere to strongly suggest acute encephalitis. 2. Severe chronic small vessel ischemic disease with multiple chronic infarcts as above. Electronically Signed   By: Logan Bores M.D.   On: 09/03/2018 12:36   Dg Swallowing Func-speech Pathology  Result Date: 09/04/2018 Objective Swallowing Evaluation: Type of Study: MBS-Modified Barium Swallow Study  Patient Details Name: HOLLIS OH MRN: [id number] Date of Birth: December 24, 1927 Today's Date: 09/04/2018 Time: SLP Start Time (ACUTE ONLY): 1324 -SLP Stop Time (ACUTE ONLY):  1341 SLP Time Calculation (min) (ACUTE ONLY): 17 min Past Medical History: Past Medical History: Diagnosis Date  Allergic rhinitis   Arthritis   "probably in my hands" (02/26/2018)  Colonic polyp   Coronary artery disease   a. s/p cath on 02/26/2018 with 3-vessel CAD with 95% proximal LAD, 90% distal RCA, and 70% OM1 stenosis.  She underwent successful PCI with DES x1 to the distal RCA and DES x1 to the proximal LAD  GERD (gastroesophageal reflux disease)   History of blood transfusion   "don't remember why or when" (02/26/2018)  Hyperlipidemia   Hypertension   Nonalcoholic hepatosteatosis   TIA (transient ischemic attack) 2000s  "I've had ~ 3" (02/26/2018)  Type II diabetes mellitus ([place])  Past Surgical History: Past Surgical History: Procedure Laterality Date  ABDOMINAL HYSTERECTOMY    APPENDECTOMY  1950s  CARDIAC CATHETERIZATION  2001  Nonobstructive CAD  CATARACT EXTRACTION W/ INTRAOCULAR LENS  IMPLANT, BILATERAL Bilateral 2000s  CORONARY STENT INTERVENTION N/A 02/26/2018  Procedure: CORONARY STENT INTERVENTION;  Surgeon: Martinique, Peter M, MD;  Location: [facility];  Service: Cardiovascular;  Laterality: N/A;  rca   LEFT HEART CATH AND CORONARY ANGIOGRAPHY N/A 02/26/2018  Procedure: LEFT HEART CATH AND CORONARY ANGIOGRAPHY;  Surgeon: Martinique, Peter M, MD;  Location: [facility];  Service: Cardiovascular;  Laterality: N/A;  TONSILLECTOMY  9052 HPI: 85 year old female admitted due to worsening left arm pain and brief episode of dysarthria. MRI on 3/31 shows new signal abnormality in mesial left temporal lobe with associated voume loss suggesting a late subacute to chronic infarct. PMH of stroke with residual left-sided weakness, diabetes mellitus type 2, hypertension, coronary artery disease, hyperlipidemia, NASH, and GERD. MBS in 12/19 post CVA revealed mild oropharyngea dysphagia; Pt on dys 3 diet and nectar thick liquids due to silent aspiration on thin.  Subjective: Pt alert and  cooperative Assessment / Plan / Recommendation CHL IP CLINICAL IMPRESSIONS 09/04/2018 Clinical Impression Pt has a mild-moderate oropharyngeal dysphagia primarily due to cognitive and sensory impairments. She has oral holding regardless of consistency, with Mod cues provided by SLP to attend to bolus and initiate posterior transit. Pt's swallow triggered at the pyriform sinuses with thin and nectar thick liquids, and although this occurred relatively quickly, if a large enough volume reached the pyriform sinuses before the swallow, she would intermittently and silently aspirate these consistencies. Attempts at modifying delivery method, bolus size, and postural strategies could not prevent penetration from occurring, which would sometimes reach as far as the true vocal folds. Even when more  shallow, it would not clear with cued coughing. Pt's mentation would also likely make it difficulty for her to consistently follow commands for compensatory strategies. Pt had no airway invasion with honey thick liquids or solids. Recommend Dys 2 diet and honey thick liquids. SLP will f/u for tolerance and potential to advance. SLP Visit Diagnosis Dysphagia, oropharyngeal phase (R13.12) Attention and concentration deficit following -- Frontal lobe and executive function deficit following -- Impact on safety and function Mild aspiration risk;Moderate aspiration risk   CHL IP TREATMENT RECOMMENDATION 09/04/2018 Treatment Recommendations Therapy as outlined in treatment plan below   Prognosis 09/04/2018 Prognosis for Safe Diet Advancement Fair Barriers to Reach Goals Cognitive deficits;Time post onset Barriers/Prognosis Comment -- CHL IP DIET RECOMMENDATION 09/04/2018 SLP Diet Recommendations Dysphagia 2 (Fine chop) solids;Honey thick liquids Liquid Administration via Cup;Straw Medication Administration Crushed with puree Compensations Minimize environmental distractions;Slow rate;Small sips/bites Postural Changes Seated upright at 90  degrees   CHL IP OTHER RECOMMENDATIONS 09/04/2018 Recommended Consults -- Oral Care Recommendations Oral care BID Other Recommendations Order thickener from pharmacy;Prohibited food (jello, ice cream, thin soups);Remove water pitcher   CHL IP FOLLOW UP RECOMMENDATIONS 09/04/2018 Follow up Recommendations Home health SLP;24 hour supervision/assistance   CHL IP FREQUENCY AND DURATION 09/04/2018 Speech Therapy Frequency (ACUTE ONLY) min 2x/week Treatment Duration 2 weeks      CHL IP ORAL PHASE 09/04/2018 Oral Phase Impaired Oral - Pudding Teaspoon -- Oral - Pudding Cup -- Oral - Honey Teaspoon -- Oral - Honey Cup Holding of bolus Oral - Nectar Teaspoon -- Oral - Nectar Cup Holding of bolus Oral - Nectar Straw Holding of bolus Oral - Thin Teaspoon -- Oral - Thin Cup Holding of bolus Oral - Thin Straw -- Oral - Puree Holding of bolus Oral - Mech Soft Impaired mastication;Holding of bolus Oral - Regular -- Oral - Multi-Consistency -- Oral - Pill -- Oral Phase - Comment --  CHL IP PHARYNGEAL PHASE 09/04/2018 Pharyngeal Phase Impaired Pharyngeal- Pudding Teaspoon -- Pharyngeal -- Pharyngeal- Pudding Cup -- Pharyngeal -- Pharyngeal- Honey Teaspoon -- Pharyngeal -- Pharyngeal- Honey Cup Delayed swallow initiation-pyriform sinuses Pharyngeal -- Pharyngeal- Nectar Teaspoon -- Pharyngeal -- Pharyngeal- Nectar Cup Delayed swallow initiation-pyriform sinuses;Penetration/Aspiration during swallow Pharyngeal Material enters airway, CONTACTS cords and not ejected out Pharyngeal- Nectar Straw Delayed swallow initiation-pyriform sinuses;Penetration/Aspiration during swallow Pharyngeal Material enters airway, passes BELOW cords without attempt by patient to eject out (silent aspiration) Pharyngeal- Thin Teaspoon -- Pharyngeal -- Pharyngeal- Thin Cup Delayed swallow initiation-pyriform sinuses;Penetration/Aspiration during swallow Pharyngeal Material enters airway, passes BELOW cords without attempt by patient to eject out (silent aspiration)  Pharyngeal- Thin Straw -- Pharyngeal -- Pharyngeal- Puree Delayed swallow initiation-vallecula Pharyngeal -- Pharyngeal- Mechanical Soft Delayed swallow initiation-vallecula Pharyngeal -- Pharyngeal- Regular -- Pharyngeal -- Pharyngeal- Multi-consistency -- Pharyngeal -- Pharyngeal- Pill -- Pharyngeal -- Pharyngeal Comment --  CHL IP CERVICAL ESOPHAGEAL PHASE 09/04/2018 Cervical Esophageal Phase WFL Pudding Teaspoon -- Pudding Cup -- Honey Teaspoon -- Honey Cup -- Nectar Teaspoon -- Nectar Cup -- Nectar Straw -- Thin Teaspoon -- Thin Cup -- Thin Straw -- Puree -- Mechanical Soft -- Regular -- Multi-consistency -- Pill -- Cervical Esophageal Comment -- Venita Sheffield Nix 09/04/2018, 3:54 PM  Pollyann Glen, M.A. [facility] Pager [phone] Office [phone]              Cardiac Studies   Echocardiogram 09/04/2018: IMPRESSIONS    1. Moderate hypokinesis of the left ventricular, basal-mid inferior wall and inferoseptal wall.  2. The left ventricle has mild-moderately  reduced systolic function, with an ejection fraction of 40-45%. The cavity size was normal. There is mildly increased left ventricular wall thickness. Left ventricular diastolic Doppler parameters are consistent  with pseudonormalization. Elevated mean left atrial pressure.  3. The right ventricle has normal systolic function. The cavity was normal. There is no increase in right ventricular wall thickness. Right ventricular systolic pressure is mildly elevated with an estimated pressure of 41.4 mmHg.  4. Left atrial size was mildly dilated.  5. Right atrial size was mildly dilated.  6. Mild thickening of the mitral valve leaflet. There is moderate mitral annular calcification present. Mitral valve regurgitation is moderate by color flow Doppler. The MR jet is centrally-directed.  7. The aortic valve is tricuspid. Moderate thickening of the aortic valve. Moderate calcification of the aortic valve. mild-moderate stenosis of  the aortic valve.  8. The aortic root and ascending aorta are normal in size and structure.  9. The inferior vena cava was normal in size with <50% respiratory variability. 10. When compared to the prior study: 05/07/2018, there is reduction in left ventricular systolic function due to a new inferior/inferoseptal wall motion abnormality, suggesting ischemia due to restenosis in the right coronary arery. There is substantial  worsening of the mitral insufficiency, probably with an ischemic mechanism.  LHC 02/2018:  Dist RCA lesion is 90% stenosed.  Prox LAD lesion is 95% stenosed.  Ost 1st Mrg lesion is 70% stenosed.  A drug-eluting stent was successfully placed using a STENT SIERRA 3.50 X 15 MM.  Post intervention, there is a 0% residual stenosis.  Post intervention, there is a 0% residual stenosis.  A drug-eluting stent was successfully placed using a STENT SIERRA 3.00 X 15 MM.  LV end diastolic pressure is normal.   1. 3 vessel obstructive CAD.     - focal 95% proximal LAD    - 70% OM1    - 90% focal distal RCA 2. Normal LVEDP 3. Successful PCI of the Distal RCA with DES x 1 4. Successful PCI of the proximal LAD with DES x 1  Plan: observe overnight. May resume coumadin tonight per pharmacy. Since she is on DAPT I would not bridge with Lovenox. Anticipate DC in am. She is a candidate for the Xience 28 study and if enrolled this will determine her antiplatelet strategy.  Recommend to resume Warfarin, at currently prescribed dose and frequency, on 02/26/18.  Recommend concurrent antiplatelet therapy of Aspirin 93m daily for 1 month and Clopidogrel 764mdaily for 6 months.  Patient Profile     [age].o. female with known coronary artery disease and percutaneous revascularization in September 2019 (DES to distal RCA and DES to proximal LAD), moderate aortic stenosis, hypertension, hyperlipidemia, type 2 diabetes mellitus, paroxysmal atrial fibrillation noted on previous 30-day  event monitor, history of multiple TIAs and stroke in December 2019 now with acute on chronic ischemia in the same distribution, presents with complaints of left arm pain 09/03/2018, relieved with sublingual nitroglycerin.  Has new T wave inversion and mild elevation in cardiac troponin I.  Assessment & Plan    1. NSTEMI: patient presented with left arm pain. EKG with new TWI. Trop trend: 0.43>0.28>0.48>1.90. Echo revealed new systolic dysfunction with EF 40-45% and moderate hypokinesis of the LV, basal-mid inferior wall and inferoseptal wall, new moderate MR. Decision made to pursue LHC for further evaluation - LHC pending today - keep NPO. Consent obtained from husband. - Continue heparin gtt - Continue aspirin, plavix, BB, and statin. Plan  Eliquis + plavix after PCI. - Still concern about possible contrast allergy, will premedicate.  2. CVA: found to have a subacute stroke on brain MRI after presenting with dysarthria. Felt to not be related to atrial fibrillation, however patient does have recent diagnosis of PAF. Recommended for plavix and eliquis after cardiac work-up for high CHAD2VASC score.  - Continue management per Neurology. Being evaluated by PT/OT/Speech and the Stroke service. - Anticipate starting eliquis following LHC  3. PAF: diagnosed on recent cardiac event monitor, however has not been started on anticoagulation.  - Continue BB for rate control - Plan to start eliquis following LHC  4. HTN: BP still mildy elevated but overall stable.  - Continue BB and losartan - can uptitrate as needed for better BP control  5. HLD: LDL 143 this admission. Concern for medication non-compliance. History of intolerance to high intensity statin. Crestor uptitrated to 10 mg daily this admission - Continue statin  6. AS: noted to be moderate on echo this admission.  - Continue routine monitoring    For questions or updates, please contact [place] Please consult [email or web address] for  contact info under Cardiology/STEMI.      Signed, Abigail Butts, PA-C  09/05/2018, 7:50 AM   [phone]

## 2018-09-05 NOTE — Progress Notes (Signed)
STROKE TEAM PROGRESS NOTE  INTERVAL HISTORY Just returned from Cardiac underwent 2 stents. He is groggy and not moving left side as well as right but cooperation is variable  Vitals:   09/05/18 0020 09/05/18 0207 09/05/18 0636 09/05/18 0839  BP: (!) 153/58  (!) 155/73 (!) 157/70  Pulse:   70 70  Resp:   (!) 22 (!) 22  Temp:   97.7 F (36.5 C) (!) 97.4 F (36.3 C)  TempSrc:    Oral  SpO2:   96% 95%  Weight:  68.3 kg    Height:        CBC:  Recent Labs  Lab 09/03/18 0156 09/05/18 0444  WBC 11.5* 9.5  NEUTROABS 8.5*  --   HGB 14.0 13.3  HCT 43.6 41.2  MCV 95.4 91.6  PLT 250 229    Basic Metabolic Panel:  Recent Labs  Lab 09/03/18 0156 09/03/18 0628 09/05/18 0444  NA 141  --  138  K 4.2  --  3.0*  CL 101  --  101  CO2 29  --  26  GLUCOSE 214*  --  157*  BUN 15  --  10  CREATININE 0.66  --  0.64  CALCIUM 9.9 9.5 9.6  MG  --  1.4*  --   PHOS  --  3.6  --    Lipid Panel:     Component Value Date/Time   CHOL 227 (H) 09/03/2018 0628   TRIG 146 09/03/2018 0628   HDL 55 09/03/2018 0628   CHOLHDL 4.1 09/03/2018 0628   VLDL 29 09/03/2018 0628   LDLCALC 143 (H) 09/03/2018 0628   HgbA1c:  Lab Results  Component Value Date   HGBA1C 7.6 (H) 09/03/2018   Urine Drug Screen:     Component Value Date/Time   LABOPIA NONE DETECTED 05/07/2018 0213   COCAINSCRNUR NONE DETECTED 05/07/2018 0213   LABBENZ NONE DETECTED 05/07/2018 0213   AMPHETMU NONE DETECTED 05/07/2018 0213   THCU NONE DETECTED 05/07/2018 0213   LABBARB NONE DETECTED 05/07/2018 0213    Alcohol Level     Component Value Date/Time   [facility] <10 05/07/2018 0258    IMAGING Mr Jodene Nam Head Wo Contrast  Result Date: 09/03/2018 CLINICAL DATA:  LEFT arm pain. Follow-up LEFT temporal infarct. History of hypertension and hyperlipidemia. EXAM: MRA HEAD WITHOUT CONTRAST TECHNIQUE: Angiographic images of the Circle of Willis were obtained using MRA technique without intravenous contrast. COMPARISON:  MRA head  May 07, 2018 FINDINGS: Mild motion degraded examination. ANTERIOR CIRCULATION: Flow related enhancement of the included cervical, petrous, cavernous and supraclinoid internal carotid arteries. Moderate stenosis bilateral cavernous ICA's. Patent anterior communicating artery. Tandem signal loss LEFT > RIGHT ACA. Severe stenosis distal LEFT M1 segment. LEFT M2 segment occlusion and, moderate stenosis LEFT M2 segment. No large vessel occlusion, flow limiting stenosis, or aneurysm. POSTERIOR CIRCULATION: Codominant vertebral arteries. Vertebrobasilar arteries are patent, with normal flow related enhancement of the main branch vessels. Severe stenosis RIGHT P1 segment, occluded RIGHT PCA at P1 2 junction. Severe tandem stenosis LEFT PCA. No large vessel occlusion, flow limiting stenosis, or aneurysm. ANATOMIC VARIANTS: None. Source data and MIP images were reviewed. IMPRESSION: 1. Mild motion degraded examination. Chronically occluded RIGHT PCA and LEFT M2 segment. 2. Stable severe stenoses versus focal occlusions bilateral ACA and LEFT PCA. Severe stenosis LEFT M1 segment. 3. Moderate bilateral ICA stenosis. Electronically Signed   By: Elon Alas M.D.   On: 09/03/2018 22:28   Mr Brain Wo Contrast  Result  Date: 09/03/2018 CLINICAL DATA:  Dysarthria. Left-sided weakness and confusion. Transient left arm pain. Accelerated hypertension. Diabetes. EXAM: MRI HEAD WITHOUT CONTRAST TECHNIQUE: Multiplanar, multiecho pulse sequences of the brain and surrounding structures were obtained without intravenous contrast. COMPARISON:  Head CT 09/03/2018 and MRI 05/07/2018 FINDINGS: Brain: There is new abnormal T2 hyperintensity in the mesial left temporal lobe involving the hippocampal head and anterior body with associated volume loss including ex vacuo dilatation of the left temporal horn. This area predominantly demonstrates facilitated diffusion, however there is a 5 mm focus of restricted diffusion. There is no  diffusion abnormality involving the mesial right temporal lobe or other limbic structures. Patchy to confluent T2 hyperintensities throughout the cerebral white matter bilaterally are similar to the prior MRI and nonspecific but compatible with severe chronic small vessel ischemic disease with multiple chronic white matter infarcts again seen. Patchy T2 hyperintensity in the pons has progressed with interval development of encephalomalacia on the right corresponding to an acute infarct on the prior MRI. A small chronic infarct in the left cerebellar hemisphere is unchanged. Chronic lacunar infarcts in the thalami have slightly increased in number. No intracranial hemorrhage, mass, midline shift, or extra-axial fluid collection is identified. Vascular: Major intracranial vascular flow voids are preserved. Skull and upper cervical spine: Unremarkable bone marrow signal. Sinuses/Orbits: Bilateral cataract extraction. Small right maxillary sinus mucous retention cyst. Large right mastoid effusion. Other: None. IMPRESSION: 1. New signal abnormality in the mesial left temporal lobe with associated volume loss suggesting a late subacute to chronic infarct or other nonacute insult. An associated 5 mm focus of restricted diffusion could reflect minimal superimposed acute ischemia or possibly the sequelae of recent seizure activity. No acute abnormality identified elsewhere to strongly suggest acute encephalitis. 2. Severe chronic small vessel ischemic disease with multiple chronic infarcts as above. Electronically Signed   By: Logan Bores M.D.   On: 09/03/2018 12:36   Dg Swallowing Func-speech Pathology  Result Date: 09/04/2018 Objective Swallowing Evaluation: Type of Study: MBS-Modified Barium Swallow Study  Patient Details Name: ADYLEE LEONARDO MRN: [id number] Date of Birth: [birth year] Today's Date: 09/04/2018 Time: SLP Start Time (ACUTE ONLY): 1324 -SLP Stop Time (ACUTE ONLY): 1341 SLP Time Calculation (min) (ACUTE ONLY):  17 min Past Medical History: Past Medical History: Diagnosis Date . Allergic rhinitis  . Arthritis   "probably in my hands" (02/26/2018) . Colonic polyp  . Coronary artery disease   a. s/p cath on 02/26/2018 with 3-vessel CAD with 95% proximal LAD, 90% distal RCA, and 70% OM1 stenosis.  She underwent successful PCI with DES x1 to the distal RCA and DES x1 to the proximal LAD . GERD (gastroesophageal reflux disease)  . History of blood transfusion   "don't remember why or when" (02/26/2018) . Hyperlipidemia  . Hypertension  . Nonalcoholic hepatosteatosis  . TIA (transient ischemic attack) 2000s  "I've had ~ 3" (02/26/2018) . Type II diabetes mellitus ([place])  Past Surgical History: Past Surgical History: Procedure Laterality Date . ABDOMINAL HYSTERECTOMY   . APPENDECTOMY  1950s . CARDIAC CATHETERIZATION  2001  Nonobstructive CAD . CATARACT EXTRACTION W/ INTRAOCULAR LENS  IMPLANT, BILATERAL Bilateral 2000s . CORONARY STENT INTERVENTION N/A 02/26/2018  Procedure: CORONARY STENT INTERVENTION;  Surgeon: Martinique, Peter M, MD;  Location: [place] [facility];  Service: Cardiovascular;  Laterality: N/A;  rca  . LEFT HEART CATH AND CORONARY ANGIOGRAPHY N/A 02/26/2018  Procedure: LEFT HEART CATH AND CORONARY ANGIOGRAPHY;  Surgeon: Martinique, Peter M, MD;  Location: [place] [facility];  Service: Cardiovascular;  Laterality: N/A; . TONSILLECTOMY  3577 HPI: 85 year old female admitted due to worsening left arm pain and brief episode of dysarthria. MRI on 3/31 shows new signal abnormality in mesial left temporal lobe with associated voume loss suggesting a late subacute to chronic infarct. PMH of stroke with residual left-sided weakness, diabetes mellitus type 2, hypertension, coronary artery disease, hyperlipidemia, NASH, and GERD. MBS in 12/19 post CVA revealed mild oropharyngea dysphagia; Pt on dys 3 diet and nectar thick liquids due to silent aspiration on thin.  Subjective: Pt alert and cooperative Assessment / Plan / Recommendation CHL  IP CLINICAL IMPRESSIONS 09/04/2018 Clinical Impression Pt has a mild-moderate oropharyngeal dysphagia primarily due to cognitive and sensory impairments. She has oral holding regardless of consistency, with Mod cues provided by SLP to attend to bolus and initiate posterior transit. Pt's swallow triggered at the pyriform sinuses with thin and nectar thick liquids, and although this occurred relatively quickly, if a large enough volume reached the pyriform sinuses before the swallow, she would intermittently and silently aspirate these consistencies. Attempts at modifying delivery method, bolus size, and postural strategies could not prevent penetration from occurring, which would sometimes reach as far as the true vocal folds. Even when more shallow, it would not clear with cued coughing. Pt's mentation would also likely make it difficulty for her to consistently follow commands for compensatory strategies. Pt had no airway invasion with honey thick liquids or solids. Recommend Dys 2 diet and honey thick liquids. SLP will f/u for tolerance and potential to advance. SLP Visit Diagnosis Dysphagia, oropharyngeal phase (R13.12) Attention and concentration deficit following -- Frontal lobe and executive function deficit following -- Impact on safety and function Mild aspiration risk;Moderate aspiration risk   CHL IP TREATMENT RECOMMENDATION 09/04/2018 Treatment Recommendations Therapy as outlined in treatment plan below   Prognosis 09/04/2018 Prognosis for Safe Diet Advancement Fair Barriers to Reach Goals Cognitive deficits;Time post onset Barriers/Prognosis Comment -- CHL IP DIET RECOMMENDATION 09/04/2018 SLP Diet Recommendations Dysphagia 2 (Fine chop) solids;Honey thick liquids Liquid Administration via Cup;Straw Medication Administration Crushed with puree Compensations Minimize environmental distractions;Slow rate;Small sips/bites Postural Changes Seated upright at 90 degrees   CHL IP OTHER RECOMMENDATIONS 09/04/2018  Recommended Consults -- Oral Care Recommendations Oral care BID Other Recommendations Order thickener from pharmacy;Prohibited food (jello, ice cream, thin soups);Remove water pitcher   CHL IP FOLLOW UP RECOMMENDATIONS 09/04/2018 Follow up Recommendations Home health SLP;24 hour supervision/assistance   CHL IP FREQUENCY AND DURATION 09/04/2018 Speech Therapy Frequency (ACUTE ONLY) min 2x/week Treatment Duration 2 weeks      CHL IP ORAL PHASE 09/04/2018 Oral Phase Impaired Oral - Pudding Teaspoon -- Oral - Pudding Cup -- Oral - Honey Teaspoon -- Oral - Honey Cup Holding of bolus Oral - Nectar Teaspoon -- Oral - Nectar Cup Holding of bolus Oral - Nectar Straw Holding of bolus Oral - Thin Teaspoon -- Oral - Thin Cup Holding of bolus Oral - Thin Straw -- Oral - Puree Holding of bolus Oral - Mech Soft Impaired mastication;Holding of bolus Oral - Regular -- Oral - Multi-Consistency -- Oral - Pill -- Oral Phase - Comment --  CHL IP PHARYNGEAL PHASE 09/04/2018 Pharyngeal Phase Impaired Pharyngeal- Pudding Teaspoon -- Pharyngeal -- Pharyngeal- Pudding Cup -- Pharyngeal -- Pharyngeal- Honey Teaspoon -- Pharyngeal -- Pharyngeal- Honey Cup Delayed swallow initiation-pyriform sinuses Pharyngeal -- Pharyngeal- Nectar Teaspoon -- Pharyngeal -- Pharyngeal- Nectar Cup Delayed swallow initiation-pyriform sinuses;Penetration/Aspiration during swallow Pharyngeal Material enters airway, CONTACTS cords and not ejected out Pharyngeal-  Nectar Straw Delayed swallow initiation-pyriform sinuses;Penetration/Aspiration during swallow Pharyngeal Material enters airway, passes BELOW cords without attempt by patient to eject out (silent aspiration) Pharyngeal- Thin Teaspoon -- Pharyngeal -- Pharyngeal- Thin Cup Delayed swallow initiation-pyriform sinuses;Penetration/Aspiration during swallow Pharyngeal Material enters airway, passes BELOW cords without attempt by patient to eject out (silent aspiration) Pharyngeal- Thin Straw -- Pharyngeal -- Pharyngeal-  Puree Delayed swallow initiation-vallecula Pharyngeal -- Pharyngeal- Mechanical Soft Delayed swallow initiation-vallecula Pharyngeal -- Pharyngeal- Regular -- Pharyngeal -- Pharyngeal- Multi-consistency -- Pharyngeal -- Pharyngeal- Pill -- Pharyngeal -- Pharyngeal Comment --  CHL IP CERVICAL ESOPHAGEAL PHASE 09/04/2018 Cervical Esophageal Phase WFL Pudding Teaspoon -- Pudding Cup -- Honey Teaspoon -- Honey Cup -- Nectar Teaspoon -- Nectar Cup -- Nectar Straw -- Thin Teaspoon -- Thin Cup -- Thin Straw -- Puree -- Mechanical Soft -- Regular -- Multi-consistency -- Pill -- Cervical Esophageal Comment -- Venita Sheffield Nix 09/04/2018, 3:54 PM  Pollyann Glen, M.A. CCC-SLP Acute Rehabilitation Services Pager [phone] Office [phone]              PHYSICAL EXAM  Pleasant elderly Caucasian lady not in distress. . Afebrile. Head is nontraumatic. Neck is supple without bruit.    Cardiac exam no murmur or gallop. Lungs are clear to auscultation. Distal pulses are well felt. Neurological Exam :  Patient is drowsy  . She is oriented to person only. She has diminished attention, registration and recall. She obeys only simple commands and answers to her name.speech is hypophonic and slightly dysarthric. Tongue is midline. Mild left lower facial weakness. Tongue midline. Motor system exam she does not follow commands consistently but is able to move right upper and lower extremity against gravity well. There appears to be mild weakness on the left side though she is not fully cooperative for detailed testing. Sensation and coordination cannot be reliably tested. Both plantars are downgoing.   ASSESSMENT/PLAN Ms. ADELIE CROSWELL is a 85 y.o. female with history of HTN, HLD, DB, TIA presenting with L sided arm pain that resolved w/ NTG and transient dysarthria. MRI showed a chronic L temporal infarcts w/ superimposed small acute ischemia. This in setting of acute coronary syndrome.   Stroke:   Acute on chronic L temporal infarct  in setting of acute coronary syndrome, likely embolic d/t ACS versus asymptomatic and transient worsening of dysarthria in the setting of medical stress.seizure can cause similar MRA abnormality but unlikely given the clinical history of presentation.  CT head No acute stroke. Severe small vessel disease. Old lacunes.   CXR mild CM. AA. No acute pulm process  MRI  New mesial L temporal lobe subacute to chronic infarct w/ 79m DWI positive area - ? Ischemia or result of sz. Severe SVD. Mult old infarcts.   MRA  Chronically occluded R PCA and L M2. Stable severe stenosis vs focal occlusions B ACA and L PCA. Severe stenosis L M1. Mod B ICA stenosis.   Carotid Doppler  pending   2D Echo moderate hypokinesis of the left ventricular basal mid inferior wall and inferoseptal wall with EF of 40-45%.  LDL 143  HgbA1c 7.6  IV heparin for VTE prophylaxis Diet Order            Diet NPO time specified Except for: Sips with Meds  Diet effective midnight              aspirin 81 mg daily and clopidogrel 75 mg daily prior to admission, now on aspirin 81 mg daily, clopidogrel 75 mg daily  and heparin IV.   Therapy recommendations:  pending   Disposition:  pending   NSTEMI  Acute on chronic ischemia  CP resolved w/ normalized BP  Not a candidate for intervention   Atrial Fibrillation  Home anticoagulation:  none   Now on IV heparin for ACS  Plan d/c on Eliquis and Plavix at this time   Likely Baseline Dementia   Confused and disoriented  UA ok  Hypertension  Stable . Ok for BP goal normotensive  Hyperlipidemia  Home meds:  crestor 5  Increased to crestor 20 in hospital  LDL 143, goal < 70  Continue statin at discharge  Diabetes type II, uncontrolled  HgbA1c 7.6, goal < 7.0  Other Stroke Risk Factors  Advanced age  Former Cigarette smoker, quit 26 yrs ago  Former smokeless tobacco user, quit 25 yrs ago  Overweight, Body mass index is 27.54 kg/m., recommend  weight loss, diet and exercise as appropriate   Hx stroke/TIA  Pt reports hx TIA x 3.   05/2018 - Seen by Dr. Merlene Laughter. MRI w/ mult acute infarcts: patchy R occipital, R pontine, punctate posterior R frontal and L occipital white matter. Small vessel disease. MRA w/ high grade narrowing L M1, L A2 and R P2/3.  Coronary artery disease s/p PCA, DES 02/2018. ACS this admission  Moderate AS  Reported hx PFO  Hx B ICA stenosis < 50% 05/2018  Hospital day # 2  I have personally obtained history,examined this patient, reviewed notes, independently viewed imaging studies, participated in medical decision making and plan of care.ROS completed by me personally and pertinent positives fully documented  I have made any additions or clarifications directly to the above note. Consider starting eliquis and stable from cardiac standpoint for stroke prevention. Continue ongoing therapies. Greater than 50% time during this 25 minute visit was spent on counseling and coordination of care and discussion with care team. Discussed with Dr. Burnetta Sabin, MD Medical Director [place] Pager: [phone] 09/05/2018 1:29 PM   Antony Contras, MD Medical Director [place] Pager: [phone] 09/05/2018 9:11 AM   To contact Stroke Continuity provider, please refer to [email or web address] After hours, contact General Neurology

## 2018-09-05 NOTE — Progress Notes (Signed)
Remains confused and agitated. Pulling at lines. Mitten placed on Rt hand for safety. NT at bedside with pt. 1:1. Dr. Leonie Man notified. Ativan given at this time. CT scan ordered.

## 2018-09-05 NOTE — Interval H&P Note (Signed)
History and Physical Interval Note:  09/05/2018 11:01 AM  [name]  has presented today for cardiac cath with the diagnosis of non stemi.  The various methods of treatment have been discussed with the patient and family. After consideration of risks, benefits and other options for treatment, the patient has consented to  Procedure(s): LEFT HEART CATH AND CORONARY ANGIOGRAPHY (N/A) as a surgical intervention.  The patient's history has been reviewed, patient examined, no change in status, stable for surgery.  I have reviewed the patient's chart and labs.  Questions were answered to the patient's satisfaction.    Cath Lab Visit (complete for each Cath Lab visit)  Clinical Evaluation Leading to the Procedure:   ACS: Yes.    Non-ACS:    Anginal Classification: CCS III  Anti-ischemic medical therapy: Minimal Therapy (1 class of medications)  Non-Invasive Test Results: No non-invasive testing performed  Prior CABG: No previous CABG         Lauree Chandler

## 2018-09-05 NOTE — Progress Notes (Signed)
SLP Cancellation Note  Patient Details Name: [name] MRN: [id number] DOB: 1928/03/30   Cancelled treatment:       Reason Eval/Treat Not Completed: Patient at procedure or test/unavailable   Venita Sheffield Tripp Goins 09/05/2018, 11:08 AM  Pollyann Glen, M.A. [place] Acute Environmental education officer [phone] Office [phone]

## 2018-09-05 NOTE — Discharge Instructions (Addendum)
YOUR CARDIOLOGY TEAM HAS ARRANGED FOR AN E-VISIT FOR YOUR APPOINTMENT - PLEASE REVIEW IMPORTANT INFORMATION BELOW SEVERAL DAYS PRIOR TO YOUR APPOINTMENT  Due to the recent COVID-19 pandemic, we are transitioning in-person office visits to tele-medicine visits in an effort to decrease unnecessary exposure to our patients and staff. Medicare and most insurances are covering these visits without a copay needed. We also encourage you to sign up for MyChart if you have not already done so. If possible, we also ask that you have a blood pressure cuff and scale at home to measure your blood pressure, heart rate and weight prior to your scheduled appointment. Patients with clinical needs that need an in-person evaluation and testing will still be able to come to the office if absolutely necessary. If you have any questions, feel free to call our office.   2-3 DAYS BEFORE YOUR APPOINTMENT  You will receive a telephone call from one of our [place] team members - your caller ID may say "Unknown caller." We will remind you check your blood pressure, heart rate and weight prior to your scheduled appointment. Our staff will also make sure you have reviewed the consent and agree to move forward with your scheduled tele-health visit.     THE DAY OF YOUR APPOINTMENT  Approximately 15 minutes prior to your scheduled appointment, you will receive a telephone call from one of [facility] team - your caller ID may say "Unknown caller."  Our staff will confirm medications, vital signs for the day and any symptoms you may be experiencing. Please have this information available prior to the time of visit start. It may also be helpful for you to have a pad of paper and pen handy for any instructions given during your visit.    CONSENT FOR TELE-HEALTH VISIT - PLEASE REVIEW  I hereby voluntarily request, consent and authorize [facility] and its employed or contracted physicians, physician assistants, nurse practitioners  or other licensed health care professionals (the Practitioner), to provide me with telemedicine health care services (the Services") as deemed necessary by the treating Practitioner. I acknowledge and consent to receive the Services by the Practitioner via telemedicine. I understand that the telemedicine visit will involve communicating with the Practitioner through live audiovisual communication technology and the disclosure of certain medical information by electronic transmission. I acknowledge that I have been given the opportunity to request an in-person assessment or other available alternative prior to the telemedicine visit and am voluntarily participating in the telemedicine visit.  I understand that I have the right to withhold or withdraw my consent to the use of telemedicine in the course of my care at any time, without affecting my right to future care or treatment, and that the Practitioner or I may terminate the telemedicine visit at any time. I understand that I have the right to inspect all information obtained and/or recorded in the course of the telemedicine visit and may receive copies of available information for a reasonable fee.  I understand that some of the potential risks of receiving the Services via telemedicine include:   Delay or interruption in medical evaluation due to technological equipment failure or disruption;  Information transmitted may not be sufficient (e.g. poor resolution of images) to allow for appropriate medical decision making by the Practitioner; and/or   In rare instances, security protocols could fail, causing a breach of personal health information.  Furthermore, I acknowledge that it is my responsibility to provide information about my medical history, conditions and care that  is complete and accurate to the best of my ability. I acknowledge that Practitioner's advice, recommendations, and/or decision may be based on factors not within their control,  such as incomplete or inaccurate data provided by me or distortions of diagnostic images or specimens that may result from electronic transmissions. I understand that the practice of medicine is not an exact science and that Practitioner makes no warranties or guarantees regarding treatment outcomes. I acknowledge that I will receive a copy of this consent concurrently upon execution via email to the email address I last provided but may also request a printed copy by calling the office of [facility].    I understand that my insurance will be billed for this visit.   I have read or had this consent read to me.  I understand the contents of this consent, which adequately explains the benefits and risks of the Services being provided via telemedicine.   I have been provided ample opportunity to ask questions regarding this consent and the Services and have had my questions answered to my satisfaction.  I give my informed consent for the services to be provided through the use of telemedicine in my medical care  By participating in this telemedicine visit I agree to the above.  ---------------------------------------------------------  Information on my medicine - ELIQUIS (apixaban)  Why was Eliquis prescribed for you? Eliquis was prescribed for you to reduce the risk of a blood clot forming that can cause a stroke if you have a medical condition called atrial fibrillation (a type of irregular heartbeat).  What do You need to know about Eliquis ? Take your Eliquis TWICE DAILY - one tablet in the morning and one tablet in the evening with or without food. If you have difficulty swallowing the tablet whole please discuss with your pharmacist how to take the medication safely.  Take Eliquis exactly as prescribed by your doctor and DO NOT stop taking Eliquis without talking to the doctor who prescribed the medication.  Stopping may increase your risk of developing a stroke.  Refill your  prescription before you run out.  After discharge, you should have regular check-up appointments with your healthcare provider that is prescribing your Eliquis.  In the future your dose may need to be changed if your kidney function or weight changes by a significant amount or as you get older.  What do you do if you miss a dose? If you miss a dose, take it as soon as you remember on the same day and resume taking twice daily.  Do not take more than one dose of ELIQUIS at the same time to make up a missed dose.  Important Safety Information A possible side effect of Eliquis is bleeding. You should call your healthcare provider right away if you experience any of the following: ? Bleeding from an injury or your nose that does not stop. ? Unusual colored urine (red or dark brown) or unusual colored stools (red or black). ? Unusual bruising for unknown reasons. ? A serious fall or if you hit your head (even if there is no bleeding).  Some medicines may interact with Eliquis and might increase your risk of bleeding or clotting while on Eliquis. To help avoid this, consult your healthcare provider or pharmacist prior to using any new prescription or non-prescription medications, including herbals, vitamins, non-steroidal anti-inflammatory drugs (NSAIDs) and supplements.  This website has more information on Eliquis (apixaban): http://www.eliquis.com/eliquis/home

## 2018-09-05 NOTE — TOC Progression Note (Signed)
Transition of Care [facility]) - Progression Note    Patient Details  Name: CHARLESIA CANADAY MRN: [id number] Date of Birth: [birth year]  Transition of [facility]) CM/SW Contact  Graves-Bigelow, Ocie Cornfield, RN Phone Number: 09/05/2018, 11:36 AM  Clinical Narrative:  PT recommendations for SNF- CM and CSW spoke with patients husband and still wants patient to return home with RN, PT, OT, SLP. [facility] will continue to follow for [facility] care needs.  Husband wants to use [employer] for wheelchair, cushion, and footrests. CM will continue to monitor for assistance with transition of care needs.    Expected Discharge Plan: [place] Barriers to Discharge: No Barriers Identified  Expected Discharge Plan and Services Expected Discharge Plan: [place] In-house Referral: NA Discharge Planning Services: CM Consult Post Acute Care Choice: Durable Medical Equipment, Home Health Living arrangements for the past 2 months: Single Family Home(Pt completed several weeks of rehab at [facility] )                 DME Arranged: Wheelchair manual(Cushion and footrests) DME Agency: [employer] HH Arranged: OT, Speech Therapy [place] Agency: [facility])   Social Determinants of Health (SDOH) Interventions    Readmission Risk Interventions No flowsheet data found.

## 2018-09-05 NOTE — Progress Notes (Signed)
Pt's husband updated via telephone of agitation this afternoon post cath. Husband made aware of order for CT scan. Verbalized understanding. No questions or concerns expressed.

## 2018-09-05 NOTE — Progress Notes (Addendum)
Progress Note  Patient Name: [name] Date of Encounter: 09/05/2018  Primary Cardiologist: Kate Sable, MD   Subjective   Calm, lying relaxed in bed. Opens eyes to voice and acknowledges her name. Denies angina and dyspnea. Disoriented to place ands time.  Inpatient Medications    Scheduled Meds:   stroke: mapping our early stages of recovery book   Does not apply Once   aspirin EC  81 mg Oral Daily   Chlorhexidine Gluconate Cloth  6 each Topical Daily   clopidogrel  75 mg Oral Q breakfast   diphenhydrAMINE  25 mg Intravenous Once   docusate sodium  100 mg Oral BID   fluticasone  2 spray Each Nare Daily   insulin aspart  0-9 Units Subcutaneous TID WC   loratadine  10 mg Oral Daily   losartan  25 mg Oral Daily   methylPREDNISolone sodium succinate  125 mg Intravenous Once   metoprolol succinate  25 mg Oral Daily   nitroGLYCERIN  1 inch Topical Once   pantoprazole  40 mg Oral Daily   rosuvastatin  20 mg Oral q1800   sodium chloride flush  3 mL Intravenous Q12H   ursodiol  300 mg Oral BID   Continuous Infusions:  sodium chloride     sodium chloride 10 mL/hr at 09/05/18 0528   sodium chloride     heparin 900 Units/hr (09/04/18 0952)   PRN Meds: sodium chloride, acetaminophen **OR** acetaminophen, albuterol, haloperidol lactate, HYDROcodone-acetaminophen, ketorolac, levalbuterol, magnesium citrate, morphine injection, ondansetron **OR** ondansetron (ZOFRAN) IV, polyvinyl alcohol, Resource ThickenUp Clear, senna-docusate, sodium chloride flush, sorbitol, traZODone   Vital Signs    Vitals:   09/04/18 2021 09/05/18 0020 09/05/18 0207 09/05/18 0636  BP: (!) 181/84 (!) 153/58  (!) 155/73  Pulse: 97   70  Resp: 20   (!) 22  Temp: 99.3 F (37.4 C)   97.7 F (36.5 C)  TempSrc: Oral     SpO2: 96%   96%  Weight:   68.3 kg   Height:        Intake/Output Summary (Last 24 hours) at 09/05/2018 0750 Last data filed at 09/05/2018 5003 Gross per 24  hour  Intake 279.71 ml  Output 475 ml  Net -195.29 ml   Filed Weights   09/03/18 1705 09/04/18 0611 09/05/18 0207  Weight: 70 kg 68.1 kg 68.3 kg    Telemetry    Sinus tachycardia this AM, now NSR - Personally Reviewed  ECG    No new tracing - Personally Reviewed  Physical Exam  Elderly. Calm. GEN: No acute distress.   Neck: No JVD, no carotid bruits Cardiac:  RRR, no murmurs, rubs, or gallops.  Respiratory: Clear to auscultation bilaterally, no wheezes/ rales/ rhonchi GI: NABS, Soft, nontender, non-distended  MS: No edema; No deformity. Neuro:  Nonfocal, moving all extremities spontaneously Psych: Normal affect   Labs    Chemistry Recent Labs  Lab 09/03/18 0156 09/03/18 0628 09/05/18 0444  NA 141  --  138  K 4.2  --  3.0*  CL 101  --  101  CO2 29  --  26  GLUCOSE 214*  --  157*  BUN 15  --  10  CREATININE 0.66  --  0.64  CALCIUM 9.9 9.5 9.6  PROT 7.4  --   --   ALBUMIN 4.0  --   --   AST 20  --   --   ALT 11  --   --  ALKPHOS 136*  --   --   BILITOT 0.4  --   --   GFRNONAA >60  --  >60  GFRAA >60  --  >60  ANIONGAP 11  --  11     Hematology Recent Labs  Lab 09/03/18 0156 09/05/18 0444  WBC 11.5* 9.5  RBC 4.57 4.50  HGB 14.0 13.3  HCT 43.6 41.2  MCV 95.4 91.6  MCH 30.6 29.6  MCHC 32.1 32.3  RDW 12.9 12.9  PLT 250 200    Cardiac Enzymes Recent Labs  Lab 09/03/18 0156 09/03/18 0341 09/03/18 0628 09/03/18 1303  TROPONINI 0.43* 0.28* 0.48* 1.90*   No results for input(s): TROPIPOC in the last 168 hours.   BNP Recent Labs  Lab 09/03/18 0156 09/03/18 0341  BNP 463.0* 415.0*     DDimer  Recent Labs  Lab 09/03/18 0156  DDIMER 0.35     Radiology    Mr Jodene Nam Head Wo Contrast  Result Date: 09/03/2018 CLINICAL DATA:  LEFT arm pain. Follow-up LEFT temporal infarct. History of hypertension and hyperlipidemia. EXAM: MRA HEAD WITHOUT CONTRAST TECHNIQUE: Angiographic images of the Circle of Willis were obtained using MRA technique  without intravenous contrast. COMPARISON:  MRA head May 07, 2018 FINDINGS: Mild motion degraded examination. ANTERIOR CIRCULATION: Flow related enhancement of the included cervical, petrous, cavernous and supraclinoid internal carotid arteries. Moderate stenosis bilateral cavernous ICA's. Patent anterior communicating artery. Tandem signal loss LEFT > RIGHT ACA. Severe stenosis distal LEFT M1 segment. LEFT M2 segment occlusion and, moderate stenosis LEFT M2 segment. No large vessel occlusion, flow limiting stenosis, or aneurysm. POSTERIOR CIRCULATION: Codominant vertebral arteries. Vertebrobasilar arteries are patent, with normal flow related enhancement of the main branch vessels. Severe stenosis RIGHT P1 segment, occluded RIGHT PCA at P1 2 junction. Severe tandem stenosis LEFT PCA. No large vessel occlusion, flow limiting stenosis, or aneurysm. ANATOMIC VARIANTS: None. Source data and MIP images were reviewed. IMPRESSION: 1. Mild motion degraded examination. Chronically occluded RIGHT PCA and LEFT M2 segment. 2. Stable severe stenoses versus focal occlusions bilateral ACA and LEFT PCA. Severe stenosis LEFT M1 segment. 3. Moderate bilateral ICA stenosis. Electronically Signed   By: Elon Alas M.D.   On: 09/03/2018 22:28   Mr Brain Wo Contrast  Result Date: 09/03/2018 CLINICAL DATA:  Dysarthria. Left-sided weakness and confusion. Transient left arm pain. Accelerated hypertension. Diabetes. EXAM: MRI HEAD WITHOUT CONTRAST TECHNIQUE: Multiplanar, multiecho pulse sequences of the brain and surrounding structures were obtained without intravenous contrast. COMPARISON:  Head CT 09/03/2018 and MRI 05/07/2018 FINDINGS: Brain: There is new abnormal T2 hyperintensity in the mesial left temporal lobe involving the hippocampal head and anterior body with associated volume loss including ex vacuo dilatation of the left temporal horn. This area predominantly demonstrates facilitated diffusion, however there is a 5  mm focus of restricted diffusion. There is no diffusion abnormality involving the mesial right temporal lobe or other limbic structures. Patchy to confluent T2 hyperintensities throughout the cerebral white matter bilaterally are similar to the prior MRI and nonspecific but compatible with severe chronic small vessel ischemic disease with multiple chronic white matter infarcts again seen. Patchy T2 hyperintensity in the pons has progressed with interval development of encephalomalacia on the right corresponding to an acute infarct on the prior MRI. A small chronic infarct in the left cerebellar hemisphere is unchanged. Chronic lacunar infarcts in the thalami have slightly increased in number. No intracranial hemorrhage, mass, midline shift, or extra-axial fluid collection is identified. Vascular: Major intracranial vascular flow  voids are preserved. Skull and upper cervical spine: Unremarkable bone marrow signal. Sinuses/Orbits: Bilateral cataract extraction. Small right maxillary sinus mucous retention cyst. Large right mastoid effusion. Other: None. IMPRESSION: 1. New signal abnormality in the mesial left temporal lobe with associated volume loss suggesting a late subacute to chronic infarct or other nonacute insult. An associated 5 mm focus of restricted diffusion could reflect minimal superimposed acute ischemia or possibly the sequelae of recent seizure activity. No acute abnormality identified elsewhere to strongly suggest acute encephalitis. 2. Severe chronic small vessel ischemic disease with multiple chronic infarcts as above. Electronically Signed   By: Logan Bores M.D.   On: 09/03/2018 12:36   Dg Swallowing Func-speech Pathology  Result Date: 09/04/2018 Objective Swallowing Evaluation: Type of Study: MBS-Modified Barium Swallow Study  Patient Details Name: JAMECIA LERMAN MRN: [id number] Date of Birth: [birth year] Today's Date: 09/04/2018 Time: SLP Start Time (ACUTE ONLY): 1324 -SLP Stop Time (ACUTE ONLY):  1341 SLP Time Calculation (min) (ACUTE ONLY): 17 min Past Medical History: Past Medical History: Diagnosis Date  Allergic rhinitis   Arthritis   "probably in my hands" (02/26/2018)  Colonic polyp   Coronary artery disease   a. s/p cath on 02/26/2018 with 3-vessel CAD with 95% proximal LAD, 90% distal RCA, and 70% OM1 stenosis.  She underwent successful PCI with DES x1 to the distal RCA and DES x1 to the proximal LAD  GERD (gastroesophageal reflux disease)   History of blood transfusion   "don't remember why or when" (02/26/2018)  Hyperlipidemia   Hypertension   Nonalcoholic hepatosteatosis   TIA (transient ischemic attack) 2000s  "I've had ~ 3" (02/26/2018)  Type II diabetes mellitus ([place])  Past Surgical History: Past Surgical History: Procedure Laterality Date  ABDOMINAL HYSTERECTOMY    APPENDECTOMY  1950s  CARDIAC CATHETERIZATION  2001  Nonobstructive CAD  CATARACT EXTRACTION W/ INTRAOCULAR LENS  IMPLANT, BILATERAL Bilateral 2000s  CORONARY STENT INTERVENTION N/A 02/26/2018  Procedure: CORONARY STENT INTERVENTION;  Surgeon: Martinique, Peter M, MD;  Location: [facility];  Service: Cardiovascular;  Laterality: N/A;  rca   LEFT HEART CATH AND CORONARY ANGIOGRAPHY N/A 02/26/2018  Procedure: LEFT HEART CATH AND CORONARY ANGIOGRAPHY;  Surgeon: Martinique, Peter M, MD;  Location: [facility];  Service: Cardiovascular;  Laterality: N/A;  TONSILLECTOMY  453 HPI: 85 year old female admitted due to worsening left arm pain and brief episode of dysarthria. MRI on 3/31 shows new signal abnormality in mesial left temporal lobe with associated voume loss suggesting a late subacute to chronic infarct. PMH of stroke with residual left-sided weakness, diabetes mellitus type 2, hypertension, coronary artery disease, hyperlipidemia, NASH, and GERD. MBS in 12/19 post CVA revealed mild oropharyngea dysphagia; Pt on dys 3 diet and nectar thick liquids due to silent aspiration on thin.  Subjective: Pt alert and  cooperative Assessment / Plan / Recommendation CHL IP CLINICAL IMPRESSIONS 09/04/2018 Clinical Impression Pt has a mild-moderate oropharyngeal dysphagia primarily due to cognitive and sensory impairments. She has oral holding regardless of consistency, with Mod cues provided by SLP to attend to bolus and initiate posterior transit. Pt's swallow triggered at the pyriform sinuses with thin and nectar thick liquids, and although this occurred relatively quickly, if a large enough volume reached the pyriform sinuses before the swallow, she would intermittently and silently aspirate these consistencies. Attempts at modifying delivery method, bolus size, and postural strategies could not prevent penetration from occurring, which would sometimes reach as far as the true vocal folds. Even when more  shallow, it would not clear with cued coughing. Pt's mentation would also likely make it difficulty for her to consistently follow commands for compensatory strategies. Pt had no airway invasion with honey thick liquids or solids. Recommend Dys 2 diet and honey thick liquids. SLP will f/u for tolerance and potential to advance. SLP Visit Diagnosis Dysphagia, oropharyngeal phase (R13.12) Attention and concentration deficit following -- Frontal lobe and executive function deficit following -- Impact on safety and function Mild aspiration risk;Moderate aspiration risk   CHL IP TREATMENT RECOMMENDATION 09/04/2018 Treatment Recommendations Therapy as outlined in treatment plan below   Prognosis 09/04/2018 Prognosis for Safe Diet Advancement Fair Barriers to Reach Goals Cognitive deficits;Time post onset Barriers/Prognosis Comment -- CHL IP DIET RECOMMENDATION 09/04/2018 SLP Diet Recommendations Dysphagia 2 (Fine chop) solids;Honey thick liquids Liquid Administration via Cup;Straw Medication Administration Crushed with puree Compensations Minimize environmental distractions;Slow rate;Small sips/bites Postural Changes Seated upright at 90  degrees   CHL IP OTHER RECOMMENDATIONS 09/04/2018 Recommended Consults -- Oral Care Recommendations Oral care BID Other Recommendations Order thickener from pharmacy;Prohibited food (jello, ice cream, thin soups);Remove water pitcher   CHL IP FOLLOW UP RECOMMENDATIONS 09/04/2018 Follow up Recommendations Home health SLP;24 hour supervision/assistance   CHL IP FREQUENCY AND DURATION 09/04/2018 Speech Therapy Frequency (ACUTE ONLY) min 2x/week Treatment Duration 2 weeks      CHL IP ORAL PHASE 09/04/2018 Oral Phase Impaired Oral - Pudding Teaspoon -- Oral - Pudding Cup -- Oral - Honey Teaspoon -- Oral - Honey Cup Holding of bolus Oral - Nectar Teaspoon -- Oral - Nectar Cup Holding of bolus Oral - Nectar Straw Holding of bolus Oral - Thin Teaspoon -- Oral - Thin Cup Holding of bolus Oral - Thin Straw -- Oral - Puree Holding of bolus Oral - Mech Soft Impaired mastication;Holding of bolus Oral - Regular -- Oral - Multi-Consistency -- Oral - Pill -- Oral Phase - Comment --  CHL IP PHARYNGEAL PHASE 09/04/2018 Pharyngeal Phase Impaired Pharyngeal- Pudding Teaspoon -- Pharyngeal -- Pharyngeal- Pudding Cup -- Pharyngeal -- Pharyngeal- Honey Teaspoon -- Pharyngeal -- Pharyngeal- Honey Cup Delayed swallow initiation-pyriform sinuses Pharyngeal -- Pharyngeal- Nectar Teaspoon -- Pharyngeal -- Pharyngeal- Nectar Cup Delayed swallow initiation-pyriform sinuses;Penetration/Aspiration during swallow Pharyngeal Material enters airway, CONTACTS cords and not ejected out Pharyngeal- Nectar Straw Delayed swallow initiation-pyriform sinuses;Penetration/Aspiration during swallow Pharyngeal Material enters airway, passes BELOW cords without attempt by patient to eject out (silent aspiration) Pharyngeal- Thin Teaspoon -- Pharyngeal -- Pharyngeal- Thin Cup Delayed swallow initiation-pyriform sinuses;Penetration/Aspiration during swallow Pharyngeal Material enters airway, passes BELOW cords without attempt by patient to eject out (silent aspiration)  Pharyngeal- Thin Straw -- Pharyngeal -- Pharyngeal- Puree Delayed swallow initiation-vallecula Pharyngeal -- Pharyngeal- Mechanical Soft Delayed swallow initiation-vallecula Pharyngeal -- Pharyngeal- Regular -- Pharyngeal -- Pharyngeal- Multi-consistency -- Pharyngeal -- Pharyngeal- Pill -- Pharyngeal -- Pharyngeal Comment --  CHL IP CERVICAL ESOPHAGEAL PHASE 09/04/2018 Cervical Esophageal Phase WFL Pudding Teaspoon -- Pudding Cup -- Honey Teaspoon -- Honey Cup -- Nectar Teaspoon -- Nectar Cup -- Nectar Straw -- Thin Teaspoon -- Thin Cup -- Thin Straw -- Puree -- Mechanical Soft -- Regular -- Multi-consistency -- Pill -- Cervical Esophageal Comment -- Venita Sheffield Nix 09/04/2018, 3:54 PM  Pollyann Glen, M.A. [facility] Pager [phone] Office [phone]              Cardiac Studies   Echocardiogram 09/04/2018: IMPRESSIONS    1. Moderate hypokinesis of the left ventricular, basal-mid inferior wall and inferoseptal wall.  2. The left ventricle has mild-moderately  reduced systolic function, with an ejection fraction of 40-45%. The cavity size was normal. There is mildly increased left ventricular wall thickness. Left ventricular diastolic Doppler parameters are consistent  with pseudonormalization. Elevated mean left atrial pressure.  3. The right ventricle has normal systolic function. The cavity was normal. There is no increase in right ventricular wall thickness. Right ventricular systolic pressure is mildly elevated with an estimated pressure of 41.4 mmHg.  4. Left atrial size was mildly dilated.  5. Right atrial size was mildly dilated.  6. Mild thickening of the mitral valve leaflet. There is moderate mitral annular calcification present. Mitral valve regurgitation is moderate by color flow Doppler. The MR jet is centrally-directed.  7. The aortic valve is tricuspid. Moderate thickening of the aortic valve. Moderate calcification of the aortic valve. mild-moderate stenosis of  the aortic valve.  8. The aortic root and ascending aorta are normal in size and structure.  9. The inferior vena cava was normal in size with <50% respiratory variability. 10. When compared to the prior study: 05/07/2018, there is reduction in left ventricular systolic function due to a new inferior/inferoseptal wall motion abnormality, suggesting ischemia due to restenosis in the right coronary arery. There is substantial  worsening of the mitral insufficiency, probably with an ischemic mechanism.  LHC 02/2018:  Dist RCA lesion is 90% stenosed.  Prox LAD lesion is 95% stenosed.  Ost 1st Mrg lesion is 70% stenosed.  A drug-eluting stent was successfully placed using a STENT SIERRA 3.50 X 15 MM.  Post intervention, there is a 0% residual stenosis.  Post intervention, there is a 0% residual stenosis.  A drug-eluting stent was successfully placed using a STENT SIERRA 3.00 X 15 MM.  LV end diastolic pressure is normal.   1. 3 vessel obstructive CAD.     - focal 95% proximal LAD    - 70% OM1    - 90% focal distal RCA 2. Normal LVEDP 3. Successful PCI of the Distal RCA with DES x 1 4. Successful PCI of the proximal LAD with DES x 1  Plan: observe overnight. May resume coumadin tonight per pharmacy. Since she is on DAPT I would not bridge with Lovenox. Anticipate DC in am. She is a candidate for the Xience 28 study and if enrolled this will determine her antiplatelet strategy.  Recommend to resume Warfarin, at currently prescribed dose and frequency, on 02/26/18.  Recommend concurrent antiplatelet therapy of Aspirin 16m daily for 1 month and Clopidogrel 780mdaily for 6 months.  Patient Profile     [age].o. female with known coronary artery disease and percutaneous revascularization in September 2019 (DES to distal RCA and DES to proximal LAD), moderate aortic stenosis, hypertension, hyperlipidemia, type 2 diabetes mellitus, paroxysmal atrial fibrillation noted on previous 30-day  event monitor, history of multiple TIAs and stroke in December 2019 now with acute on chronic ischemia in the same distribution, presents with complaints of left arm pain 09/03/2018, relieved with sublingual nitroglycerin.  Has new T wave inversion and mild elevation in cardiac troponin I.  Assessment & Plan    1. NSTEMI: patient presented with left arm pain. EKG with new TWI. Trop trend: 0.43>0.28>0.48>1.90. Echo revealed new systolic dysfunction with EF 40-45% and moderate hypokinesis of the LV, basal-mid inferior wall and inferoseptal wall, new moderate MR. Decision made to pursue LHC for further evaluation - LHC pending today - keep NPO. Consent obtained from husband. - Continue heparin gtt - Continue aspirin, plavix, BB, and statin. Plan  Eliquis + plavix after PCI. - Still concern about possible contrast allergy, will premedicate.  2. CVA: found to have a subacute stroke on brain MRI after presenting with dysarthria. Felt to not be related to atrial fibrillation, however patient does have recent diagnosis of PAF. Recommended for plavix and eliquis after cardiac work-up for high CHAD2VASC score.  - Continue management per Neurology. Being evaluated by PT/OT/Speech and the Stroke service. - Anticipate starting eliquis following LHC  3. PAF: diagnosed on recent cardiac event monitor, however has not been started on anticoagulation.  - Continue BB for rate control - Plan to start eliquis following LHC  4. HTN: BP still mildy elevated but overall stable.  - Continue BB and losartan - can uptitrate as needed for better BP control  5. HLD: LDL 143 this admission. Concern for medication non-compliance. History of intolerance to high intensity statin. Crestor uptitrated to 10 mg daily this admission - Continue statin  6. AS: noted to be moderate on echo this admission.  - Continue routine monitoring    For questions or updates, please contact [place] Please consult [email or web address] for  contact info under Cardiology/STEMI.      Signed, Abigail Butts, PA-C  09/05/2018, 7:50 AM   [phone]

## 2018-09-06 ENCOUNTER — Telehealth: Payer: Self-pay | Admitting: Physician Assistant

## 2018-09-06 ENCOUNTER — Telehealth: Payer: Self-pay

## 2018-09-06 ENCOUNTER — Telehealth: Payer: Self-pay | Admitting: Cardiovascular Disease

## 2018-09-06 ENCOUNTER — Encounter (HOSPITAL_COMMUNITY): Payer: Self-pay | Admitting: Cardiovascular Disease

## 2018-09-06 DIAGNOSIS — E1159 Type 2 diabetes mellitus with other circulatory complications: Secondary | ICD-10-CM

## 2018-09-06 DIAGNOSIS — R410 Disorientation, unspecified: Secondary | ICD-10-CM

## 2018-09-06 DIAGNOSIS — I255 Ischemic cardiomyopathy: Secondary | ICD-10-CM | POA: Diagnosis present

## 2018-09-06 DIAGNOSIS — Z8673 Personal history of transient ischemic attack (TIA), and cerebral infarction without residual deficits: Secondary | ICD-10-CM

## 2018-09-06 DIAGNOSIS — I480 Paroxysmal atrial fibrillation: Secondary | ICD-10-CM

## 2018-09-06 DIAGNOSIS — I350 Nonrheumatic aortic (valve) stenosis: Secondary | ICD-10-CM

## 2018-09-06 DIAGNOSIS — I6340 Cerebral infarction due to embolism of unspecified cerebral artery: Secondary | ICD-10-CM

## 2018-09-06 DIAGNOSIS — I639 Cerebral infarction, unspecified: Secondary | ICD-10-CM

## 2018-09-06 DIAGNOSIS — I340 Nonrheumatic mitral (valve) insufficiency: Secondary | ICD-10-CM

## 2018-09-06 LAB — BASIC METABOLIC PANEL
Anion gap: 11 (ref 5–15)
BUN: 13 mg/dL (ref 8–23)
CO2: 24 mmol/L (ref 22–32)
Calcium: 10.2 mg/dL (ref 8.9–10.3)
Chloride: 105 mmol/L (ref 98–111)
Creatinine, Ser: 0.64 mg/dL (ref 0.44–1.00)
GFR calc Af Amer: >60 mL/min (ref >60)
GFR calc non Af Amer: >60 mL/min (ref >60)
Glucose, Bld: 189 mg/dL — ABNORMAL HIGH (ref 70–99)
Potassium: 3.8 mmol/L (ref 3.5–5.1)
Sodium: 140 mmol/L (ref 135–145)

## 2018-09-06 LAB — CBC
HCT: 39.3 % (ref 36.0–46.0)
Hemoglobin: 13 g/dL (ref 12.0–15.0)
MCH: 30.3 pg (ref 26.0–34.0)
MCHC: 33.1 g/dL (ref 30.0–36.0)
MCV: 91.6 fL (ref 80.0–100.0)
Platelets: 215 10*3/uL (ref 150–400)
RBC: 4.29 MIL/uL (ref 3.87–5.11)
RDW: 13 % (ref 11.5–15.5)
WBC: 12.4 10*3/uL — ABNORMAL HIGH (ref 4.0–10.5)
nRBC: 0 % (ref 0.0–0.2)

## 2018-09-06 LAB — GLUCOSE, CAPILLARY: Glucose-Capillary: 145 mg/dL — ABNORMAL HIGH (ref 70–99)

## 2018-09-06 MED ORDER — METOPROLOL SUCCINATE ER 100 MG PO TB24
100.0000 mg | ORAL_TABLET | Freq: Every day | ORAL | Status: DC
  Filled 2018-09-06: qty 1

## 2018-09-06 MED ORDER — ROSUVASTATIN CALCIUM 20 MG PO TABS: 20.0000 mg | ORAL_TABLET | Freq: Every evening | ORAL | 1 refills | Status: DC

## 2018-09-06 MED ORDER — ROSUVASTATIN CALCIUM 20 MG PO TABS: 20.0000 mg | ORAL_TABLET | Freq: Every evening | ORAL | 3 refills | Status: AC

## 2018-09-06 MED ORDER — LOSARTAN POTASSIUM 50 MG PO TABS
100.0000 mg | ORAL_TABLET | Freq: Every day | ORAL | Status: DC
  Administered 2018-09-06: 100 mg via ORAL
  Filled 2018-09-06: qty 2

## 2018-09-06 MED FILL — Nitroglycerin IV Soln 100 MCG/ML in D5W: INTRA_ARTERIAL | Qty: 10 | Status: AC

## 2018-09-06 NOTE — Care Plan (Signed)
DME Note  Patient suffers from dementia and history of stroke which impairs their ability to perform daily activities like bathing, dressing, grooming, and toileting in the home.  A walker will not resolve  issue with performing activities of daily living. A wheelchair will allow patient to safely perform daily activities. Patient is not able to propel themselves in the home using a standard weight wheelchair due to general weakness. Patient can self propel in the lightweight wheelchair.  Accessories: elevating leg rests (ELRs), wheel locks, extensions and anti-tippers.

## 2018-09-06 NOTE — Telephone Encounter (Signed)
I spoke with husband, patient d/c'd from [facility] today.He states he understands instructions and is aware of f/u virtual apt on 09/17/18.He also needs refill for Crestor as dose is now daily.I will e-scribe this for him

## 2018-09-06 NOTE — TOC Transition Note (Signed)
Transition of Care [facility]) - CM/SW Discharge Note   Patient Details  Name: LAVENDER STANKE MRN: [id number] Date of Birth: 1927-09-03  [facility] of [facility] CM/SW Contact:  Bethena Roys, RN Phone Number: 09/06/2018, 10:20 AM   Clinical Narrative:  Plan for transition home via ambulance. Husband is agreeable for transport via ambulance. DME WC to be delivered to the patients home. [facility] aware that the patient will transition home today. No further needs from CM at this time.    Final next level of care: [place] Barriers to Discharge: No Barriers Identified   Patient Goals and CMS Choice Patient states their goals for this hospitalization and ongoing recovery are:: to return home with the support of her family  CMS Medicare.gov Compare Post Acute Care list provided to:: (awaiting PT evaluation to determine if SNF is necessary ) Choice offered to / list presented to : Spouse   Discharge Plan and Services In-house Referral: NA Discharge Planning Services: CM Consult Post Acute Care Choice: Durable Medical Equipment, Home Health          DME Arranged: Wheelchair manual(Cushion and footrests) DME Agency: [facility] HH Arranged: RN, Disease Management, PT, OT, Speech Therapy HH Agency: [facility]   Social Determinants of Health (SDOH) Interventions     Readmission Risk Interventions No flowsheet data found.

## 2018-09-06 NOTE — Progress Notes (Signed)
  Speech Language Pathology Treatment: Dysphagia  Patient Details Name: RENAI LOPATA MRN: [id number] DOB: 1927/09/11 Today's Date: 09/06/2018 Time: 1000-1040 SLP Time Calculation (min) (ACUTE ONLY): 40 min  Assessment / Plan / Recommendation Clinical Impression  Patient seen to address dysphagia goals with breakfast meal (Dys 2, honey thick liquids). Patient able to feed self and demonstrated good attention during self-feeding, but did require cues to swallow to clear PO's that remained in oral cavity due to decreased mastication and oral transit of bolus. Patient exhibited intermittent delayed coughing and throat clearing which appeared to be secondary to having food still in oral cavity while attempting to take another bite or sip of liquids.     HPI: 85 year old female admitted due to worsening left arm pain and brief episode of dysarthria. MRI on 3/31 shows new signal abnormality in mesial left temporal lobe with associated voume loss suggesting a late subacute to chronic infarct. PMH of stroke with residual left-sided weakness, diabetes mellitus type 2, hypertension, coronary artery disease, hyperlipidemia, NASH, and GERD. MBS in 12/19 post CVA revealed mild oropharyngea dysphagia; Pt on dys 3 diet and nectar thick liquids due to silent aspiration on thin.       SLP Plan          Recommendations  Diet recommendations: Dysphagia 2 (fine chop);Honey-thick liquid Liquids provided via: Cup Medication Administration: Whole meds with puree Supervision: Patient able to self feed;Full supervision/cueing for compensatory strategies Compensations: Minimize environmental distractions;Slow rate;Small sips/bites;Lingual sweep for clearance of pocketing;Other (Comment)(oral care after PO's) Postural Changes and/or Swallow Maneuvers: Seated upright 90 degrees                        GO              Sonia Baller, MA, CCC-SLP Speech Therapy [facility]

## 2018-09-06 NOTE — Telephone Encounter (Signed)
Messaged D Dunn PA-C to clarify

## 2018-09-06 NOTE — Telephone Encounter (Signed)
   TELEPHONE CALL NOTE  This patient has been deemed a candidate for follow-up tele-health visit to limit community exposure during the Covid-19 pandemic. I spoke with the patient's husband via phone to discuss instructions. This has been outlined on the patient's AVS (dotphrase: [email or web address]). The patient's husband was advised to review the section on consent for treatment as well. The patient will receive a phone call 2-3 days prior to their E-Visit at which time consent will be verbally confirmed. A Virtual Office Visit appointment type has been scheduled for 4/14 with "TELEPHONE" in the appointment notes - patient's husband prefers telephone. They decline to sign up for MyChart at this time.  Charlie Pitter, PA-C 09/06/2018 10:50 AM

## 2018-09-06 NOTE — Progress Notes (Signed)
Due to the safety and concern for the health and well-being of our patients during the National Pandemic Covid-19, the Cardiac rehab department staff are unable to provide face to face cardiac rehab phase 1 interaction through approximately April 6h. However, these patients who are affected will be contacted upon discharge at a later date.  Patients will be provided education on Nutrition, Individualized plan for Exercise with modifications and risk factor modification.  A referral to Outpatient Cardiac Rehab program will be provided for applicable patients.  Currently the [facility] program is closed to patients for group exercise.  Based upon pt medical history of dementia, oriented only to self and history of stroke pt would not be appropriate for exercise in group setting.  Pt will received home health and PT.  Cherre Huger, BSN Cardiac and Training and development officer

## 2018-09-06 NOTE — Discharge Summary (Addendum)
Discharge Summary    Patient ID: TEESHA OHM,  MRN: [id number], DOB/AGE: [birth year] 85 y.o.  Admit date: 09/03/2018 Discharge date: 09/06/2018  Primary Care Provider: Loman Brooklyn Primary Cardiologist: Kate Sable, MD Primary Electrophysiologist:  None  Discharge Diagnoses    Principal Problem:   NSTEMI (non-ST elevated myocardial infarction) [facility]) Active Problems:   Type II diabetes mellitus ([place])   Hyperlipidemia   Essential hypertension   Coronary atherosclerosis   GERD (gastroesophageal reflux disease)   TIA (transient ischemic attack)   Weakness   Arm pain, anterior, left   Angina at rest [facility])   Stroke (HCC)   PAF (paroxysmal atrial fibrillation) (HCC)   Moderate aortic stenosis   Moderate mitral regurgitation   Disorientation   Ischemic cardiomyopathy    Diagnostic Studies/Procedures    Cardiac Cath 09/05/18 Conclusion     Prox RCA to Mid RCA lesion is 30% stenosed.  Mid RCA lesion is 30% stenosed.  Previously placed Mid RCA to Dist RCA stent (unknown type) is widely patent.  Dist RCA lesion is 99% stenosed.  A drug-eluting stent was successfully placed using a STENT SYNERGY DES 3X12.  Post intervention, there is a 0% residual stenosis.  Ost 2nd Mrg lesion is 70% stenosed.  Previously placed Ost LAD to Prox LAD stent (unknown type) is widely patent.  Prox LAD lesion is 90% stenosed.  A drug-eluting stent was successfully placed using a STENT SYNERGY DES 2.75X12.  Post intervention, there is a 0% residual stenosis.   1. Severe stenosis mid LAD just beyond the patent stented segment. Successful PTCA/DES x 1 mid LAD 2. Severe stenosis distal RCA just beyond the patent stented segment. Successful PTCA/DES x 1 distal RCA 3. Moderate stenosis ostium of the small caliber second obtuse marginal branch.    2D echo 09/04/18 IMPRESSIONS   1. Moderate hypokinesis of the left ventricular, basal-mid inferior wall and inferoseptal wall.  2.  The left ventricle has mild-moderately reduced systolic function, with an ejection fraction of 40-45%. The cavity size was normal. There is mildly increased left ventricular wall thickness. Left ventricular diastolic Doppler parameters are consistent  with pseudonormalization. Elevated mean left atrial pressure.  3. The right ventricle has normal systolic function. The cavity was normal. There is no increase in right ventricular wall thickness. Right ventricular systolic pressure is mildly elevated with an estimated pressure of 41.4 mmHg.  4. Left atrial size was mildly dilated.  5. Right atrial size was mildly dilated.  6. Mild thickening of the mitral valve leaflet. There is moderate mitral annular calcification present. Mitral valve regurgitation is moderate by color flow Doppler. The MR jet is centrally-directed.  7. The aortic valve is tricuspid. Moderate thickening of the aortic valve. Moderate calcification of the aortic valve. mild-moderate stenosis of the aortic valve.  8. The aortic root and ascending aorta are normal in size and structure.  9. The inferior vena cava was normal in size with <50% respiratory variability. 10. When compared to the prior study: 05/07/2018, there is reduction in left ventricular systolic function due to a new inferior/inferoseptal wall motion abnormality, suggesting ischemia due to restenosis in the right coronary arery. There is substantial  worsening of the mitral insufficiency, probably with an ischemic mechanism.  FINDINGS  Left Ventricle: The left ventricle has mild-moderately reduced systolic function, with an ejection fraction of 40-45%. The cavity size was normal. There is mildly increased left ventricular wall thickness. Left ventricular diastolic Doppler parameters  are consistent with pseudonormalization. Elevated  mean left atrial pressure Moderate hypokinesis of the left ventricular, basal-mid inferior wall and inferoseptal wall. Right Ventricle: The  right ventricle has normal systolic function. The cavity was normal. There is no increase in right ventricular wall thickness. Right ventricular systolic pressure is mildly elevated with an estimated pressure of 41.4 mmHg. Left Atrium: left atrial size was mildly dilated.   Right Atrium: right atrial size was mildly dilated. Right atrial pressure is estimated at 8 mmHg. Interatrial Septum: No atrial level shunt detected by color flow Doppler. Pericardium: There is no evidence of pericardial effusion. Mitral Valve: The mitral valve is normal in structure. Mild thickening of the mitral valve leaflet. There is moderate mitral annular calcification present. Mitral valve regurgitation is moderate by color flow Doppler. The MR jet is centrally-directed. Tricuspid Valve: The tricuspid valve is normal in structure. Tricuspid valve regurgitation is mild by color flow Doppler. Aortic Valve: The aortic valve is tricuspid Moderate thickening of the aortic valve. Moderate calcification of the aortic valve. Aortic valve regurgitation was not visualized by color flow Doppler. There is mild-moderate stenosis of the aortic valve,  with a calculated valve area of 1.61 cm. Pulmonic Valve: The pulmonic valve was grossly normal. Pulmonic valve regurgitation is not visualized by color flow Doppler. Aorta: The aortic root and ascending aorta are normal in size and structure. Venous: The inferior vena cava is normal in size with less than 50% respiratory variability. Compared to previous exam: 05/07/2018, there is reduction in left ventricular systolic function due to a new inferior/inferoseptal wall motion abnormality, suggesting ischemia due to restenosis in the right coronary arery. There is substantial worsening  of the mitral insufficiency, probably with an ischemic mechanism.   _____________     History of Present Illness     JINA OLENICK is a 85 y.o. female with history of CAD (s/p DES to distal RCA and DES to  proximal LAD in 02/2018), moderate AS, HTN, HLD, Type 2 DM, CVA (in 05/2018, with prior TIAs), SVT, PAF, dementia who presented to [facility] with left arm pain and elevated troponin.  To recap, Ms. Moffa was last examined by Dr. Bronson Ing in 05/2018 and was residing at the [facility] at that time as she was undergoing physical therapy for recovery from her recent CVA. A 30-day monitor was in place for her recent CVA and showed episodes of SVT and paroxysmal atrial fibrillation with rates up to 151 bpm but in the notes sent to nursing staff, it mentioned switching from Coumadin to Eliquis due to concerns for subtherapeutic INR but she was not taking Coumadin at that time and notes were forwarded to neurology. Per discussion with husband on day of DC, she was subsequently transitioned to Eliquis + Plavix without ASA.  She presented to [facility] ED 09/03/2018 for evaluation of left arm pain with associated dyspnea but no chest pain. Symptoms improved with SL NTG. Her BP peaked at 227/125 in the ED. Her initial labs showed WBC 11.5, Hgb 14.0, platelets 250, Na+ 141, K+ 4.2, and creatinine 0.66. BNP 463. D-dimer negative, and elevated troponin up to 0.48. FLP shows total cholesterol of 227, HDL 55, Triglycerides 146, and LDL 143. Initial EKG showed NSR, HR 77, with LAD and nonspecific ST abnormality along inferior leads. Repeat tracing at 0600 showed more significant TWI along inferolateral leads. CXR showed mild cardiomegaly and aortic atherosclerosis with no acute findings. CT Head showed no acute findings but noted to have severe chronic small vessel ischemic changes  and old lacunar infarcts. She was started on IV heparin and admitted with cardiology consultation obtained.  Hospital Course    1. NSTEMI: troponin peaked at 1.90. Echo 09/04/18 revealed new systolic dysfunction with EF 40-45% and moderate hypokinesis of the LV, basal-mid inferior wall and inferoseptal wall, and new moderate MR. The  patient was transferred to [facility] for cardiac catheterization. This was delayed by 1 day due to acute on chronic confusion requiring neurology input and re-evaluation for candidacy for cath. Ultimately she underwent cath (with pre-medication for dye allergy) 09/05/18 which showed severe stenosis of mid LAD just beyond patent stented segment treated with PTCA/DESx1, severe stenosis of distal RCA just beyond the patent stented segment s/p PTCA/DESx1, and moderate stenosis in the ostium of the small caliber OM2. Cath note suggested brief triple therapy but Dr. Sallyanne Kuster recommended discharging home on Plavix + Eliquis without aspirin given advanced age and bleeding risk. I confirmed with the patient's husband that he has these medicines in his possession at home. Anticipate 12 months total of Plavix.  2. CVA: in addition to her left arm pain one of her presenting complaints was also transient dysarthria per family. She was found to have a subacute stroke on brain MRI (New mesial L temporal lobe subacute to chronic infarct w/ 68m DWI positive area - ? Ischemia or result of sz. Severe SVD. Mult old infarcts.) MRA showed Chronically occluded R PCA and L M2. Stable severe stenosis vs focal occlusions B ACA and L PCA, severe stenosis L M1, mod B ICA stenosis. Felt to not be related to atrial fibrillation, however patient does have recent diagnosis of PAF. See above regarding plan for Eliquis. PT recommended SNF but care management spoke with husband wanted patient to return home with RN, PT, OT, SLP. He wanted to use [employer] for wheelchair, cushion, and footrests. Care management helped assist with this planning. Speech saw the patient and recommended fine chop solids, honey thick liquids, liquid administration via cup/straw, medications crushed with puree, and full supervision with meals while seated upright at 90 degrees. She was asked to f/u Dr. DMerlene Laughteras previously outlined in her chart.  3. PAF:  diagnosed on recent cardiac event monitor but not clearly on anticoagulation prior to admission. See above regarding Eliquis. In NSR/sinus tach this admission. CHADSVASC 9.  4. HTN: AM blood pressure during admission was elevated -upon admissions it appears her med doses were scaled back as it was unclear what she was taking. BP this AM was 1867systolic with plan to send out on prior home dose of Toprol and Losartan. Pt will need to continue to follow at each subsequent encounter.  5. HLD: LDL 143 this admission. There was concern for medication non-compliance. She has history of intolerance to high intensity statin. Crestor uptitrated to 20 mg daily this admission. If the patient is tolerating statin at time of follow-up appointment, would consider rechecking liver function/lipid panel in 8 weeks.  6. Moderate AS and moderate MR: continue routine monitoring clinically. Mitral regurgitation was felt to be of ischemic mechanism by echo.  7. Suspected baseline dementia: patient has been confused and disoriented during stay, requiring re-orientation and safety sitter. Neurology followed. UA was felt to be nonacute. Acute neuro findings as noted in #2. Repeat CT yesterday post-cath showed no new acute intracranial findings.  8. Diabetes mellitus: A1C 7.6 indicating uncontrolled. Will need primary care management of this. Pt advised to hold metformin post cath and restart 09/08/18.  9. Ischemic cardiomyopathy -  EF 40-45%. Will go home on BB/ARB. Not felt to have evidence of acute HF clinically requiring diuresis this admission. Outlined CHF precautions with DC info.  Medication changes have been relayed to husband Orpah Greek. He requested the patient be transferred back home via ambulance. Dr. Sallyanne Kuster has seen and examined the patient today and feels she is stable for discharge. We have arranged for a telephone f/u virtual office visit - Orpah Greek will assist. I relayed instructions on AVS. He is not interested  currently in using smartphone or more complex device so will plan on this being a telephone call only. HH PT, OT, RN, SLP and wheelchair have been arranged. _____________  Discharge Vitals Blood pressure (!) 157/76, pulse 87, temperature (!) 97.4 F (36.3 C), temperature source Oral, resp. rate 18, height 5' 2"  (1.575 m), weight 68.5 kg, SpO2 97 %.  Filed Weights   09/04/18 0611 09/05/18 0207 09/06/18 0549  Weight: 68.1 kg 68.3 kg 68.5 kg    Labs & Radiologic Studies    CBC Recent Labs    09/05/18 0444 09/06/18 0250  WBC 9.5 12.4*  HGB 13.3 13.0  HCT 41.2 39.3  MCV 91.6 91.6  PLT 200 332   Basic Metabolic Panel Recent Labs    09/05/18 0444 09/06/18 0250  NA 138 140  K 3.0* 3.8  CL 101 105  CO2 26 24  GLUCOSE 157* 189*  BUN 10 13  CREATININE 0.64 0.64  CALCIUM 9.6 10.2   Cardiac Enzymes Recent Labs    09/03/18 1303  TROPONINI 1.90*   _____________  Dg Chest 2 View  Result Date: 09/03/2018 CLINICAL DATA:  LEFT arm pain. EXAM: CHEST - 2 VIEW COMPARISON:  Chest radiograph June 04, 2013 FINDINGS: Cardiac silhouette is mildly enlarged. Mediastinal silhouette is normal. Mildly calcified aortic arch. Mild chronic interstitial changes. No pleural effusions or focal consolidations. Trachea projects midline and there is no pneumothorax. Soft tissue planes and included osseous structures are non-suspicious. IMPRESSION: 1. Mild cardiomegaly. No acute pulmonary process. 2. Aortic Atherosclerosis (ICD10-I70.0). Electronically Signed   By: Elon Alas M.D.   On: 09/03/2018 02:32   Ct Head Wo Contrast  Result Date: 09/05/2018 CLINICAL DATA:  Cerebral hemorrhage suspected. LEFT-sided weakness after cardiac catheterization. EXAM: CT HEAD WITHOUT CONTRAST TECHNIQUE: Contiguous axial images were obtained from the base of the skull through the vertex without intravenous contrast. COMPARISON:  CT head and MR brain 09/03/2018. FINDINGS: Brain: The tiny area of concern for acute  infarction on recent MR, medial LEFT temporal lobe, is not visible on today's exam. There is chronic cerebral and cerebellar atrophy, unchanged from priors. There is extensive white matter disease, likely chronic microvascular ischemic change, redemonstrated. Numerous chronic lacunar infarcts affect the centrum semiovale. No hemorrhage, mass lesion, or extra-axial fluid. Hydrocephalus ex vacuo. Vascular: Calcification of the cavernous internal carotid arteries consistent with cerebrovascular atherosclerotic disease. No signs of intracranial large vessel occlusion. Hyperattenuation of the arterial vessels and venous sinuses relates to recent contrast administration. Skull: Calvarium intact. Hyperostosis. Sinuses/Orbits: No acute finding. Other: RIGHT mastoid fluid redemonstrated, but appears improved. IMPRESSION: Atrophy and small vessel disease. No acute intracranial findings. Electronically Signed   By: Staci Righter M.D.   On: 09/05/2018 18:03   Ct Head Wo Contrast  Result Date: 09/03/2018 CLINICAL DATA:  LEFT arm pain since yesterday. History of stroke and LEFT-sided weakness. EXAM: CT HEAD WITHOUT CONTRAST TECHNIQUE: Contiguous axial images were obtained from the base of the skull through the vertex without intravenous contrast. COMPARISON:  CT HEAD May 31, 2018 FINDINGS: Mild motion degraded examination. BRAIN: No intraparenchymal hemorrhage, mass effect nor midline shift. No parenchymal brain volume loss for age. No hydrocephalus. Confluent supratentorial white matter hypodensities. Old basal ganglia and thalami infarcts. No acute large vascular territory infarcts. No abnormal extra-axial fluid collections. Basal cisterns are patent. VASCULAR: Moderate calcific atherosclerosis of the carotid siphons. SKULL: No skull fracture. Osteopenia. No significant scalp soft tissue swelling. SINUSES/ORBITS: Maxillary mucosal retention cyst. Small RIGHT mastoid effusion.The included ocular globes and orbital  contents are non-suspicious. Status post bilateral ocular lens implants. OTHER: None. IMPRESSION: 1. No acute intracranial process. 2. Stable examination including severe chronic small vessel ischemic changes and old lacunar infarcts. Electronically Signed   By: Elon Alas M.D.   On: 09/03/2018 02:16   Mr Jodene Nam Head Wo Contrast  Result Date: 09/03/2018 CLINICAL DATA:  LEFT arm pain. Follow-up LEFT temporal infarct. History of hypertension and hyperlipidemia. EXAM: MRA HEAD WITHOUT CONTRAST TECHNIQUE: Angiographic images of the Circle of Willis were obtained using MRA technique without intravenous contrast. COMPARISON:  MRA head May 07, 2018 FINDINGS: Mild motion degraded examination. ANTERIOR CIRCULATION: Flow related enhancement of the included cervical, petrous, cavernous and supraclinoid internal carotid arteries. Moderate stenosis bilateral cavernous ICA's. Patent anterior communicating artery. Tandem signal loss LEFT > RIGHT ACA. Severe stenosis distal LEFT M1 segment. LEFT M2 segment occlusion and, moderate stenosis LEFT M2 segment. No large vessel occlusion, flow limiting stenosis, or aneurysm. POSTERIOR CIRCULATION: Codominant vertebral arteries. Vertebrobasilar arteries are patent, with normal flow related enhancement of the main branch vessels. Severe stenosis RIGHT P1 segment, occluded RIGHT PCA at P1 2 junction. Severe tandem stenosis LEFT PCA. No large vessel occlusion, flow limiting stenosis, or aneurysm. ANATOMIC VARIANTS: None. Source data and MIP images were reviewed. IMPRESSION: 1. Mild motion degraded examination. Chronically occluded RIGHT PCA and LEFT M2 segment. 2. Stable severe stenoses versus focal occlusions bilateral ACA and LEFT PCA. Severe stenosis LEFT M1 segment. 3. Moderate bilateral ICA stenosis. Electronically Signed   By: Elon Alas M.D.   On: 09/03/2018 22:28   Mr Brain Wo Contrast  Result Date: 09/03/2018 CLINICAL DATA:  Dysarthria. Left-sided weakness and  confusion. Transient left arm pain. Accelerated hypertension. Diabetes. EXAM: MRI HEAD WITHOUT CONTRAST TECHNIQUE: Multiplanar, multiecho pulse sequences of the brain and surrounding structures were obtained without intravenous contrast. COMPARISON:  Head CT 09/03/2018 and MRI 05/07/2018 FINDINGS: Brain: There is new abnormal T2 hyperintensity in the mesial left temporal lobe involving the hippocampal head and anterior body with associated volume loss including ex vacuo dilatation of the left temporal horn. This area predominantly demonstrates facilitated diffusion, however there is a 5 mm focus of restricted diffusion. There is no diffusion abnormality involving the mesial right temporal lobe or other limbic structures. Patchy to confluent T2 hyperintensities throughout the cerebral white matter bilaterally are similar to the prior MRI and nonspecific but compatible with severe chronic small vessel ischemic disease with multiple chronic white matter infarcts again seen. Patchy T2 hyperintensity in the pons has progressed with interval development of encephalomalacia on the right corresponding to an acute infarct on the prior MRI. A small chronic infarct in the left cerebellar hemisphere is unchanged. Chronic lacunar infarcts in the thalami have slightly increased in number. No intracranial hemorrhage, mass, midline shift, or extra-axial fluid collection is identified. Vascular: Major intracranial vascular flow voids are preserved. Skull and upper cervical spine: Unremarkable bone marrow signal. Sinuses/Orbits: Bilateral cataract extraction. Small right maxillary sinus mucous retention cyst. Large right mastoid  effusion. Other: None. IMPRESSION: 1. New signal abnormality in the mesial left temporal lobe with associated volume loss suggesting a late subacute to chronic infarct or other nonacute insult. An associated 5 mm focus of restricted diffusion could reflect minimal superimposed acute ischemia or possibly the  sequelae of recent seizure activity. No acute abnormality identified elsewhere to strongly suggest acute encephalitis. 2. Severe chronic small vessel ischemic disease with multiple chronic infarcts as above. Electronically Signed   By: Logan Bores M.D.   On: 09/03/2018 12:36   Dg Swallowing Func-speech Pathology  Result Date: 09/04/2018 Objective Swallowing Evaluation: Type of Study: MBS-Modified Barium Swallow Study  Patient Details Name: DELISHA PEADEN MRN: [id number] Date of Birth: 09/06/1927 Today's Date: 09/04/2018 Time: SLP Start Time (ACUTE ONLY): 1324 -SLP Stop Time (ACUTE ONLY): 1341 SLP Time Calculation (min) (ACUTE ONLY): 17 min Past Medical History: Past Medical History: Diagnosis Date  Allergic rhinitis   Arthritis   "probably in my hands" (02/26/2018)  Colonic polyp   Coronary artery disease   a. s/p cath on 02/26/2018 with 3-vessel CAD with 95% proximal LAD, 90% distal RCA, and 70% OM1 stenosis.  She underwent successful PCI with DES x1 to the distal RCA and DES x1 to the proximal LAD  GERD (gastroesophageal reflux disease)   History of blood transfusion   "don't remember why or when" (02/26/2018)  Hyperlipidemia   Hypertension   Nonalcoholic hepatosteatosis   TIA (transient ischemic attack) 2000s  "I've had ~ 3" (02/26/2018)  Type II diabetes mellitus ([place])  Past Surgical History: Past Surgical History: Procedure Laterality Date  ABDOMINAL HYSTERECTOMY    APPENDECTOMY  1950s  CARDIAC CATHETERIZATION  2001  Nonobstructive CAD  CATARACT EXTRACTION W/ INTRAOCULAR LENS  IMPLANT, BILATERAL Bilateral 2000s  CORONARY STENT INTERVENTION N/A 02/26/2018  Procedure: CORONARY STENT INTERVENTION;  Surgeon: Martinique, Peter M, MD;  Location: [place] [facility];  Service: Cardiovascular;  Laterality: N/A;  rca   LEFT HEART CATH AND CORONARY ANGIOGRAPHY N/A 02/26/2018  Procedure: LEFT HEART CATH AND CORONARY ANGIOGRAPHY;  Surgeon: Martinique, Peter M, MD;  Location: [place] [facility];  Service: Cardiovascular;   Laterality: N/A;  TONSILLECTOMY  7064 HPI: 85 year old female admitted due to worsening left arm pain and brief episode of dysarthria. MRI on 3/31 shows new signal abnormality in mesial left temporal lobe with associated voume loss suggesting a late subacute to chronic infarct. PMH of stroke with residual left-sided weakness, diabetes mellitus type 2, hypertension, coronary artery disease, hyperlipidemia, NASH, and GERD. MBS in 12/19 post CVA revealed mild oropharyngea dysphagia; Pt on dys 3 diet and nectar thick liquids due to silent aspiration on thin.  Subjective: Pt alert and cooperative Assessment / Plan / Recommendation CHL IP CLINICAL IMPRESSIONS 09/04/2018 Clinical Impression Pt has a mild-moderate oropharyngeal dysphagia primarily due to cognitive and sensory impairments. She has oral holding regardless of consistency, with Mod cues provided by SLP to attend to bolus and initiate posterior transit. Pt's swallow triggered at the pyriform sinuses with thin and nectar thick liquids, and although this occurred relatively quickly, if a large enough volume reached the pyriform sinuses before the swallow, she would intermittently and silently aspirate these consistencies. Attempts at modifying delivery method, bolus size, and postural strategies could not prevent penetration from occurring, which would sometimes reach as far as the true vocal folds. Even when more shallow, it would not clear with cued coughing. Pt's mentation would also likely make it difficulty for her to consistently follow commands for compensatory strategies. Pt  had no airway invasion with honey thick liquids or solids. Recommend Dys 2 diet and honey thick liquids. SLP will f/u for tolerance and potential to advance. SLP Visit Diagnosis Dysphagia, oropharyngeal phase (R13.12) Attention and concentration deficit following -- Frontal lobe and executive function deficit following -- Impact on safety and function Mild aspiration risk;Moderate  aspiration risk   CHL IP TREATMENT RECOMMENDATION 09/04/2018 Treatment Recommendations Therapy as outlined in treatment plan below   Prognosis 09/04/2018 Prognosis for Safe Diet Advancement Fair Barriers to Reach Goals Cognitive deficits;Time post onset Barriers/Prognosis Comment -- CHL IP DIET RECOMMENDATION 09/04/2018 SLP Diet Recommendations Dysphagia 2 (Fine chop) solids;Honey thick liquids Liquid Administration via Cup;Straw Medication Administration Crushed with puree Compensations Minimize environmental distractions;Slow rate;Small sips/bites Postural Changes Seated upright at 90 degrees   CHL IP OTHER RECOMMENDATIONS 09/04/2018 Recommended Consults -- Oral Care Recommendations Oral care BID Other Recommendations Order thickener from pharmacy;Prohibited food (jello, ice cream, thin soups);Remove water pitcher   [facility] IP FOLLOW UP RECOMMENDATIONS 09/04/2018 Follow up Recommendations Home health SLP;24 hour supervision/assistance   [facility] IP FREQUENCY AND DURATION 09/04/2018 Speech Therapy Frequency (ACUTE ONLY) min 2x/week Treatment Duration 2 weeks      [facility] IP ORAL PHASE 09/04/2018 Oral Phase Impaired Oral - Pudding Teaspoon -- Oral - Pudding Cup -- Oral - Honey Teaspoon -- Oral - Honey Cup Holding of bolus Oral - Nectar Teaspoon -- Oral - Nectar Cup Holding of bolus Oral - Nectar Straw Holding of bolus Oral - Thin Teaspoon -- Oral - Thin Cup Holding of bolus Oral - Thin Straw -- Oral - Puree Holding of bolus Oral - Mech Soft Impaired mastication;Holding of bolus Oral - Regular -- Oral - Multi-Consistency -- Oral - Pill -- Oral Phase - Comment --  [facility] IP PHARYNGEAL PHASE 09/04/2018 Pharyngeal Phase Impaired Pharyngeal- Pudding Teaspoon -- Pharyngeal -- Pharyngeal- Pudding Cup -- Pharyngeal -- Pharyngeal- Honey Teaspoon -- Pharyngeal -- Pharyngeal- Honey Cup Delayed swallow initiation-pyriform sinuses Pharyngeal -- Pharyngeal- Nectar Teaspoon -- Pharyngeal -- Pharyngeal- Nectar Cup Delayed swallow initiation-pyriform  sinuses;Penetration/Aspiration during swallow Pharyngeal Material enters airway, CONTACTS cords and not ejected out Pharyngeal- Nectar Straw Delayed swallow initiation-pyriform sinuses;Penetration/Aspiration during swallow Pharyngeal Material enters airway, passes BELOW cords without attempt by patient to eject out (silent aspiration) Pharyngeal- Thin Teaspoon -- Pharyngeal -- Pharyngeal- Thin Cup Delayed swallow initiation-pyriform sinuses;Penetration/Aspiration during swallow Pharyngeal Material enters airway, passes BELOW cords without attempt by patient to eject out (silent aspiration) Pharyngeal- Thin Straw -- Pharyngeal -- Pharyngeal- Puree Delayed swallow initiation-vallecula Pharyngeal -- Pharyngeal- Mechanical Soft Delayed swallow initiation-vallecula Pharyngeal -- Pharyngeal- Regular -- Pharyngeal -- Pharyngeal- Multi-consistency -- Pharyngeal -- Pharyngeal- Pill -- Pharyngeal -- Pharyngeal Comment --  [facility]L IP CERVICAL ESOPHAGEAL PHASE 09/04/2018 Cervical Esophageal Phase WFL Pudding Teaspoon -- Pudding Cup -- Honey Teaspoon -- Honey Cup -- Nectar Teaspoon -- Nectar Cup -- Nectar Straw -- Thin Teaspoon -- Thin Cup -- Thin Straw -- Puree -- Mechanical Soft -- Regular -- Multi-consistency -- Pill -- Cervical Esophageal Comment -- Venita Sheffield Nix 09/04/2018, 3:54 PM  Pollyann Glen, M.A. [facility] Pager [phone] Office [phone]             Disposition   Pt is being discharged home today in good condition.  Follow-up Plans & Appointments    Follow-up Information    [facility] Follow up.   Why:  Registered [place], Fransisco Hertz, PA-C Follow up.   Specialties:  Librarian, academic,  Cardiology Why:  [facility] - you will have a follow-up appointment 09/17/18 at 3pm by TELEPHONE - you will not come to the office physically. Please be ready to answer your phone by 2:45pm. See end of this summary for E-visit instructions. Contact  information: [place] [address] [phone]        Phillips Odor, MD. Call.   Specialty:  Neurology Why:  Please call to schedule a follow-up appointment with your neurologist Contact information: [address] [place] [address] [phone]          Discharge Instructions    Diet - low sodium heart healthy   Complete by:  As directed    Our speech therapist recommended the following diet recommendations: - fine chop solids - honey thick liquids only - liquid administration via cup/straw - medications given with puree,  - full supervision with meals while seated upright at 90 degrees   Discharge instructions   Complete by:  As directed    Your Crestor (rosuvastatin) prescription has changed.  Do not take any aspirin since you are on two other blood thinners (clopidogrel and Eliquis).  Your vitamin E was stopped because this can make blood thinners more potent.  Metformin has to be temporarily held after a heart catheterization. You may restart your metformin on Sunday 09/08/18.  If you notice any bleeding such as blood in stool, black tarry stools, blood in urine, nosebleeds or any other unusual bleeding, call your doctor immediately. It is not normal to have this kind of bleeding while on a blood thinner and usually indicates there is an underlying problem with one of your body systems that needs to be checked out.  One of your heart tests showed that your heart muscle has weakened, likely related to your heart attack. This can increase risk of fluid retention that we call congestive heart failure. For patients with this condition, we give them these special instructions:  1. Follow a low-salt diet - you are allowed no more than 2,062m of sodium per day. Watch your fluid intake. In general, you should not be taking in more than 2 liters of fluid per day (no more than 8 glasses per day). This includes sources of water in foods like soup, coffee,  tea, milk, etc. 2. Weigh yourself on the same scale at same time of day and keep a log. 3. Call your doctor: (Anytime you feel any of the following symptoms)  - 3lb weight gain overnight or 5lb within a few days - Shortness of breath, with or without a dry hacking cough  - Swelling in the hands, feet or stomach  - If you have to sleep on extra pillows at night in order to breathe  IT IS IMPORTANT TO LET YOUR DOCTOR KNOW EARLY ON IF YOU ARE HAVING SYMPTOMS SO WE CAN HELP YOU!   Increase activity slowly   Complete by:  As directed    No driving due to your memory difficulty. No lifting over 10 lbs for 2 weeks. No sexual activity for 2 weeks. Keep procedure site clean & dry. If you notice increased pain, swelling, bleeding or pus, call/return!  You may shower, but no soaking baths/hot tubs/pools for 1 week.      Discharge Medications   Allergies as of 09/06/2018      Reactions   Ivp Dye [iodinated Diagnostic Agents] Anaphylaxis   Sulfonamide Derivatives Shortness Of Breath   Lipitor [atorvastatin Calcium]    Muscle Aches  Zetia [ezetimibe]    Muscle Aches   Penicillins Rash   As a teenager. Has patient had a PCN reaction causing immediate rash, facial/tongue/throat swelling, SOB or lightheadedness with hypotension: No Has patient had a PCN reaction causing severe rash involving mucus membranes or skin necrosis: No Has patient had a PCN reaction that required hospitalization: No Has patient had a PCN reaction occurring within the last 10 years: No If all of the above answers are "NO", then may proceed with Cephalosporin use.      Medication List    STOP taking these medications   aspirin 81 MG EC tablet   vitamin E 400 UNIT capsule Generic drug:  vitamin E     TAKE these medications   clopidogrel 75 MG tablet Commonly known as:  PLAVIX Take 1 tablet (75 mg total) by mouth daily with breakfast.   docusate sodium 100 MG capsule Commonly known as:  COLACE Take 200 mg by mouth  2 (two) times daily as needed for mild constipation.   Eliquis 5 MG Tabs tablet Generic drug:  apixaban Take 1 tablet by mouth 2 (two) times daily.   fexofenadine 180 MG tablet Commonly known as:  ALLEGRA Take 180 mg by mouth daily.   fluticasone 50 MCG/ACT nasal spray Commonly known as:  FLONASE Place 2 sprays into both nostrils daily.   losartan 100 MG tablet Commonly known as:  COZAAR Take 100 mg by mouth daily.   metFORMIN 500 MG 24 hr tablet Commonly known as:  GLUCOPHAGE-XR Take 1 tablet (500 mg total) by mouth 2 (two) times daily with a meal.   metoprolol succinate 100 MG 24 hr tablet Commonly known as:  TOPROL-XL Take 100 mg by mouth daily. Take with or immediately following a meal.   multivitamin tablet Take 1 tablet by mouth daily.   nitroGLYCERIN 0.4 MG SL tablet Commonly known as:  NITROSTAT Place 0.4 mg under the tongue every 5 (five) minutes as needed for chest pain.   nystatin powder Commonly known as:  MYCOSTATIN/NYSTOP Apply 1 application topically 2 (two) times daily.   pantoprazole 40 MG tablet Commonly known as:  PROTONIX Take 1 tablet (40 mg total) by mouth daily.   REFRESH 1 % ophthalmic solution Generic drug:  carboxymethylcellulose Place 1 drop into both eyes 2 (two) times daily as needed (dry eyes).   rosuvastatin 20 MG tablet Commonly known as:  CRESTOR Take 1 tablet (20 mg total) by mouth every evening. What changed:    medication strength  how much to take  when to take this   ursodiol 300 MG capsule Commonly known as:  ACTIGALL Take 300 mg by mouth 2 (two) times daily.            Durable Medical Equipment  (From admission, onward)         Start     Ordered   09/06/18 1005  For home use only DME lightweight manual wheelchair with seat cushion  Once    Comments:  Patient suffers from dementia and history of stroke which impairs their ability to perform daily activities like bathing, dressing, grooming, and toileting in  the home.  A walker will not resolve  issue with performing activities of daily living. A wheelchair will allow patient to safely perform daily activities. Patient is not able to propel themselves in the home using a standard weight wheelchair due to general weakness. Patient can self propel in the lightweight wheelchair.  Accessories: elevating leg rests (ELRs), wheel locks,  extensions and anti-tippers.   09/06/18 1015           Allergies:  Allergies  Allergen Reactions   Ivp Dye [Iodinated Diagnostic Agents] Anaphylaxis   Sulfonamide Derivatives Shortness Of Breath   Lipitor [Atorvastatin Calcium]     Muscle Aches   Zetia [Ezetimibe]     Muscle Aches   Penicillins Rash    As a teenager. Has patient had a PCN reaction causing immediate rash, facial/tongue/throat swelling, SOB or lightheadedness with hypotension: No Has patient had a PCN reaction causing severe rash involving mucus membranes or skin necrosis: No Has patient had a PCN reaction that required hospitalization: No Has patient had a PCN reaction occurring within the last 10 years: No If all of the above answers are "NO", then may proceed with Cephalosporin use.     Aspirin prescribed at discharge?  Yes High Intensity Statin Prescribed? (Lipitor 40-14m or Crestor 20-461m: Yes Beta Blocker Prescribed? Yes For EF <40%, was ACEI/ARB Prescribed? Yes ADP Receptor Inhibitor Prescribed? (i.e. Plavix etc.-Includes Medically Managed Patients): Yes For EF <40%, Aldosterone Inhibitor Prescribed? No: EF 40-45% Was EF assessed during THIS hospitalization? Yes Was Cardiac Rehab II ordered? (Included Medically managed Patients): No: patient with active dementia, poor candidate for OP cardiac rehab particularly during covid pandemic as well   Outstanding Labs/Studies   N/a  Duration of Discharge Encounter   Greater than 30 minutes including physician time.  Signed, DaCharlie PitterA-C 09/06/2018, 10:17  AM

## 2018-09-06 NOTE — Telephone Encounter (Signed)
-----   Message from Charlie Pitter, [place] sent at 09/06/2018  9:52 AM EDT ----- Regarding: TOC call Pt has virtual OV (phone) on 4/14 with [facility] - will need TOC call. She is demented so husband Orpah Greek provides care. Dayna Dunn PA-C

## 2018-09-06 NOTE — Telephone Encounter (Signed)
Patient's husband states that patient was discharged today from [facility] and was given "Nitro-Bid" paste. Patient's husband has questions regarding this medication. / tg

## 2018-09-06 NOTE — Telephone Encounter (Signed)
Instructed [name] to discard nitro paste as she is not using this at home.He verbalized understanding.

## 2018-09-06 NOTE — Progress Notes (Signed)
Progress Note  Patient Name: [name] Date of Encounter: 09/06/2018  Primary Cardiologist: Kate Sable, MD   Subjective   Was agitated after her procedure yesterday afternoon, but repeat head CT showed no new findings.  She is calm, relaxed this morning.  She is chronically disoriented due to dementia and is only oriented to herself.  She complains of a dry nose, but denies chest pain or dyspnea.  Her logical complaints.  Inpatient Medications    Scheduled Meds: .  stroke: mapping our early stages of recovery book   Does not apply Once  . aspirin EC  81 mg Oral Daily  . Chlorhexidine Gluconate Cloth  6 each Topical Daily  . clopidogrel  75 mg Oral Q breakfast  . docusate sodium  100 mg Oral BID  . fluticasone  2 spray Each Nare Daily  . insulin aspart  0-9 Units Subcutaneous TID WC  . loratadine  10 mg Oral Daily  . losartan  25 mg Oral Daily  . metoprolol succinate  100 mg Oral Daily  . pantoprazole  40 mg Oral Daily  . rosuvastatin  20 mg Oral q1800  . sodium chloride flush  3 mL Intravenous Q12H  . ursodiol  300 mg Oral BID   Continuous Infusions: . sodium chloride     PRN Meds: sodium chloride, acetaminophen **OR** acetaminophen, albuterol, haloperidol lactate, HYDROcodone-acetaminophen, ketorolac, levalbuterol, magnesium citrate, morphine injection, ondansetron **OR** ondansetron (ZOFRAN) IV, polyvinyl alcohol, Resource ThickenUp Clear, senna-docusate, sodium chloride flush, sorbitol, traZODone   Vital Signs    Vitals:   09/06/18 0040 09/06/18 0528 09/06/18 0549 09/06/18 0910  BP: 131/69   (!) 157/76  Pulse: 88 (!) 111  87  Resp: 18 18  18   Temp: 97.7 F (36.5 C) 97.8 F (36.6 C)  (!) 97.4 F (36.3 C)  TempSrc: Axillary Axillary  Oral  SpO2: 97% 97%  97%  Weight:   68.5 kg   Height:        Intake/Output Summary (Last 24 hours) at 09/06/2018 0919 Last data filed at 09/06/2018 0225 Gross per 24 hour  Intake 254 ml  Output 625 ml  Net -371 ml    Last 3 Weights 09/06/2018 09/05/2018 09/04/2018  Weight (lbs) 151 lb 0.2 oz 150 lb 9.2 oz 150 lb 2.1 oz  Weight (kg) 68.5 kg 68.3 kg 68.1 kg      Telemetry    NSR - Personally Reviewed  ECG    NSR, improved T wave inversions compared to 03/31 - Personally Reviewed  Physical Exam  Alert, calm, disoriented (as before) GEN: No acute distress.   Neck: No JVD Cardiac: RRR, no murmurs, rubs, or gallops.  Respiratory: Clear to auscultation bilaterally. GI: Soft, nontender, non-distended  MS: No edema; No deformity. Neuro:  Nonfocal  Psych: Normal affect  No bleeding or hematoma at cath access site.  Labs    Chemistry Recent Labs  Lab 09/03/18 0156 09/03/18 0628 09/05/18 0444 09/06/18 0250  NA 141  --  138 140  K 4.2  --  3.0* 3.8  CL 101  --  101 105  CO2 29  --  26 24  GLUCOSE 214*  --  157* 189*  BUN 15  --  10 13  CREATININE 0.66  --  0.64 0.64  CALCIUM 9.9 9.5 9.6 10.2  PROT 7.4  --   --   --   ALBUMIN 4.0  --   --   --   AST 20  --   --   --  ALT 11  --   --   --   ALKPHOS 136*  --   --   --   BILITOT 0.4  --   --   --   GFRNONAA >60  --  >60 >60  GFRAA >60  --  >60 >60  ANIONGAP 11  --  11 11     Hematology Recent Labs  Lab 09/03/18 0156 09/05/18 0444 09/06/18 0250  WBC 11.5* 9.5 12.4*  RBC 4.57 4.50 4.29  HGB 14.0 13.3 13.0  HCT 43.6 41.2 39.3  MCV 95.4 91.6 91.6  MCH 30.6 29.6 30.3  MCHC 32.1 32.3 33.1  RDW 12.9 12.9 13.0  PLT 250 200 215    Cardiac Enzymes Recent Labs  Lab 09/03/18 0156 09/03/18 0341 09/03/18 0628 09/03/18 1303  TROPONINI 0.43* 0.28* 0.48* 1.90*   No results for input(s): TROPIPOC in the last 168 hours.   BNP Recent Labs  Lab 09/03/18 0156 09/03/18 0341  BNP 463.0* 415.0*     DDimer  Recent Labs  Lab 09/03/18 0156  DDIMER 0.35     Radiology    Ct Head Wo Contrast  Result Date: 09/05/2018 CLINICAL DATA:  Cerebral hemorrhage suspected. LEFT-sided weakness after cardiac catheterization. EXAM: CT HEAD WITHOUT  CONTRAST TECHNIQUE: Contiguous axial images were obtained from the base of the skull through the vertex without intravenous contrast. COMPARISON:  CT head and MR brain 09/03/2018. FINDINGS: Brain: The tiny area of concern for acute infarction on recent MR, medial LEFT temporal lobe, is not visible on today's exam. There is chronic cerebral and cerebellar atrophy, unchanged from priors. There is extensive white matter disease, likely chronic microvascular ischemic change, redemonstrated. Numerous chronic lacunar infarcts affect the centrum semiovale. No hemorrhage, mass lesion, or extra-axial fluid. Hydrocephalus ex vacuo. Vascular: Calcification of the cavernous internal carotid arteries consistent with cerebrovascular atherosclerotic disease. No signs of intracranial large vessel occlusion. Hyperattenuation of the arterial vessels and venous sinuses relates to recent contrast administration. Skull: Calvarium intact. Hyperostosis. Sinuses/Orbits: No acute finding. Other: RIGHT mastoid fluid redemonstrated, but appears improved. IMPRESSION: Atrophy and small vessel disease. No acute intracranial findings. Electronically Signed   By: Staci Righter M.D.   On: 09/05/2018 18:03   Dg Swallowing Func-speech Pathology  Result Date: 09/04/2018 Objective Swallowing Evaluation: Type of Study: MBS-Modified Barium Swallow Study  Patient Details Name: [name] MRN: [id number] Date of Birth: Sep 24, 1927 Today's Date: 09/04/2018 Time: SLP Start Time (ACUTE ONLY): 1324 -SLP Stop Time (ACUTE ONLY): 1341 SLP Time Calculation (min) (ACUTE ONLY): 17 min Past Medical History: Past Medical History: Diagnosis Date . Allergic rhinitis  . Arthritis   "probably in my hands" (02/26/2018) . Colonic polyp  . Coronary artery disease   a. s/p cath on 02/26/2018 with 3-vessel CAD with 95% proximal LAD, 90% distal RCA, and 70% OM1 stenosis.  She underwent successful PCI with DES x1 to the distal RCA and DES x1 to the proximal LAD . GERD  (gastroesophageal reflux disease)  . History of blood transfusion   "don't remember why or when" (02/26/2018) . Hyperlipidemia  . Hypertension  . Nonalcoholic hepatosteatosis  . TIA (transient ischemic attack) 2000s  "I've had ~ 3" (02/26/2018) . Type II diabetes mellitus ([place])  Past Surgical History: Past Surgical History: Procedure Laterality Date . ABDOMINAL HYSTERECTOMY   . APPENDECTOMY  1950s . CARDIAC CATHETERIZATION  2001  Nonobstructive CAD . CATARACT EXTRACTION W/ INTRAOCULAR LENS  IMPLANT, BILATERAL Bilateral 2000s . CORONARY STENT INTERVENTION N/A 02/26/2018  Procedure: CORONARY  STENT INTERVENTION;  Surgeon: Martinique, Peter M, MD;  Location: [place] [facility];  Service: Cardiovascular;  Laterality: N/A;  rca  . LEFT HEART CATH AND CORONARY ANGIOGRAPHY N/A 02/26/2018  Procedure: LEFT HEART CATH AND CORONARY ANGIOGRAPHY;  Surgeon: Martinique, Peter M, MD;  Location: [place] [facility];  Service: Cardiovascular;  Laterality: N/A; . TONSILLECTOMY  8669 HPI: 85 year old female admitted due to worsening left arm pain and brief episode of dysarthria. MRI on 3/31 shows new signal abnormality in mesial left temporal lobe with associated voume loss suggesting a late subacute to chronic infarct. PMH of stroke with residual left-sided weakness, diabetes mellitus type 2, hypertension, coronary artery disease, hyperlipidemia, NASH, and GERD. MBS in 12/19 post CVA revealed mild oropharyngea dysphagia; Pt on dys 3 diet and nectar thick liquids due to silent aspiration on thin.  Subjective: Pt alert and cooperative Assessment / Plan / Recommendation CHL IP CLINICAL IMPRESSIONS 09/04/2018 Clinical Impression Pt has a mild-moderate oropharyngeal dysphagia primarily due to cognitive and sensory impairments. She has oral holding regardless of consistency, with Mod cues provided by SLP to attend to bolus and initiate posterior transit. Pt's swallow triggered at the pyriform sinuses with thin and nectar thick liquids, and although this  occurred relatively quickly, if a large enough volume reached the pyriform sinuses before the swallow, she would intermittently and silently aspirate these consistencies. Attempts at modifying delivery method, bolus size, and postural strategies could not prevent penetration from occurring, which would sometimes reach as far as the true vocal folds. Even when more shallow, it would not clear with cued coughing. Pt's mentation would also likely make it difficulty for her to consistently follow commands for compensatory strategies. Pt had no airway invasion with honey thick liquids or solids. Recommend Dys 2 diet and honey thick liquids. SLP will f/u for tolerance and potential to advance. SLP Visit Diagnosis Dysphagia, oropharyngeal phase (R13.12) Attention and concentration deficit following -- Frontal lobe and executive function deficit following -- Impact on safety and function Mild aspiration risk;Moderate aspiration risk   CHL IP TREATMENT RECOMMENDATION 09/04/2018 Treatment Recommendations Therapy as outlined in treatment plan below   Prognosis 09/04/2018 Prognosis for Safe Diet Advancement Fair Barriers to Reach Goals Cognitive deficits;Time post onset Barriers/Prognosis Comment -- CHL IP DIET RECOMMENDATION 09/04/2018 SLP Diet Recommendations Dysphagia 2 (Fine chop) solids;Honey thick liquids Liquid Administration via Cup;Straw Medication Administration Crushed with puree Compensations Minimize environmental distractions;Slow rate;Small sips/bites Postural Changes Seated upright at 90 degrees   CHL IP OTHER RECOMMENDATIONS 09/04/2018 Recommended Consults -- Oral Care Recommendations Oral care BID Other Recommendations Order thickener from pharmacy;Prohibited food (jello, ice cream, thin soups);Remove water pitcher   CHL IP FOLLOW UP RECOMMENDATIONS 09/04/2018 Follow up Recommendations Home health SLP;24 hour supervision/assistance   CHL IP FREQUENCY AND DURATION 09/04/2018 Speech Therapy Frequency (ACUTE ONLY) min  2x/week Treatment Duration 2 weeks      CHL IP ORAL PHASE 09/04/2018 Oral Phase Impaired Oral - Pudding Teaspoon -- Oral - Pudding Cup -- Oral - Honey Teaspoon -- Oral - Honey Cup Holding of bolus Oral - Nectar Teaspoon -- Oral - Nectar Cup Holding of bolus Oral - Nectar Straw Holding of bolus Oral - Thin Teaspoon -- Oral - Thin Cup Holding of bolus Oral - Thin Straw -- Oral - Puree Holding of bolus Oral - Mech Soft Impaired mastication;Holding of bolus Oral - Regular -- Oral - Multi-Consistency -- Oral - Pill -- Oral Phase - Comment --  CHL IP PHARYNGEAL PHASE 09/04/2018 Pharyngeal Phase Impaired  Pharyngeal- Pudding Teaspoon -- Pharyngeal -- Pharyngeal- Pudding Cup -- Pharyngeal -- Pharyngeal- Honey Teaspoon -- Pharyngeal -- Pharyngeal- Honey Cup Delayed swallow initiation-pyriform sinuses Pharyngeal -- Pharyngeal- Nectar Teaspoon -- Pharyngeal -- Pharyngeal- Nectar Cup Delayed swallow initiation-pyriform sinuses;Penetration/Aspiration during swallow Pharyngeal Material enters airway, CONTACTS cords and not ejected out Pharyngeal- Nectar Straw Delayed swallow initiation-pyriform sinuses;Penetration/Aspiration during swallow Pharyngeal Material enters airway, passes BELOW cords without attempt by patient to eject out (silent aspiration) Pharyngeal- Thin Teaspoon -- Pharyngeal -- Pharyngeal- Thin Cup Delayed swallow initiation-pyriform sinuses;Penetration/Aspiration during swallow Pharyngeal Material enters airway, passes BELOW cords without attempt by patient to eject out (silent aspiration) Pharyngeal- Thin Straw -- Pharyngeal -- Pharyngeal- Puree Delayed swallow initiation-vallecula Pharyngeal -- Pharyngeal- Mechanical Soft Delayed swallow initiation-vallecula Pharyngeal -- Pharyngeal- Regular -- Pharyngeal -- Pharyngeal- Multi-consistency -- Pharyngeal -- Pharyngeal- Pill -- Pharyngeal -- Pharyngeal Comment --  CHL IP CERVICAL ESOPHAGEAL PHASE 09/04/2018 Cervical Esophageal Phase WFL Pudding Teaspoon -- Pudding Cup --  Honey Teaspoon -- Honey Cup -- Nectar Teaspoon -- Nectar Cup -- Nectar Straw -- Thin Teaspoon -- Thin Cup -- Thin Straw -- Puree -- Mechanical Soft -- Regular -- Multi-consistency -- Pill -- Cervical Esophageal Comment -- Venita Sheffield Nix 09/04/2018, 3:54 PM  Pollyann Glen, M.A. CCC-SLP Acute Rehabilitation Services Pager [phone] Office [phone]              Cardiac Studies   CARDIAC CATH 09/05/2018  Prox RCA to Mid RCA lesion is 30% stenosed.  Mid RCA lesion is 30% stenosed.  Previously placed Mid RCA to Dist RCA stent (unknown type) is widely patent.  Dist RCA lesion is 99% stenosed.  A drug-eluting stent was successfully placed using a STENT SYNERGY DES 3X12.  Post intervention, there is a 0% residual stenosis.  Ost 2nd Mrg lesion is 70% stenosed.  Previously placed Ost LAD to Prox LAD stent (unknown type) is widely patent.  Prox LAD lesion is 90% stenosed.  A drug-eluting stent was successfully placed using a STENT SYNERGY DES 2.75X12.  Post intervention, there is a 0% residual stenosis.   1. Severe stenosis mid LAD just beyond the patent stented segment. Successful PTCA/DES x 1 mid LAD 2. Severe stenosis distal RCA just beyond the patent stented segment. Successful PTCA/DES x 1 distal RCA 3. Moderate stenosis ostium of the small caliber second obtuse marginal branch.   ECHO 09/04/2018 1. Moderate hypokinesis of the left ventricular, basal-mid inferior wall and inferoseptal wall.  2. The left ventricle has mild-moderately reduced systolic function, with an ejection fraction of 40-45%. The cavity size was normal. There is mildly increased left ventricular wall thickness. Left ventricular diastolic Doppler parameters are consistent  with pseudonormalization. Elevated mean left atrial pressure.  3. The right ventricle has normal systolic function. The cavity was normal. There is no increase in right ventricular wall thickness. Right ventricular systolic pressure is mildly  elevated with an estimated pressure of 41.4 mmHg.  4. Left atrial size was mildly dilated.  5. Right atrial size was mildly dilated.  6. Mild thickening of the mitral valve leaflet. There is moderate mitral annular calcification present. Mitral valve regurgitation is moderate by color flow Doppler. The MR jet is centrally-directed.  7. The aortic valve is tricuspid. Moderate thickening of the aortic valve. Moderate calcification of the aortic valve. mild-moderate stenosis of the aortic valve.  8. The aortic root and ascending aorta are normal in size and structure.  9. The inferior vena cava was normal in size with <50% respiratory variability. 10. When  compared to the prior study: 05/07/2018, there is reduction in left ventricular systolic function due to a new inferior/inferoseptal wall motion abnormality, suggesting ischemia due to restenosis in the right coronary arery. There is substantial  worsening of the mitral insufficiency, probably with an ischemic mechanism.  FINDINGS  Left Ventricle: The left ventricle has mild-moderately reduced systolic function, with an ejection fraction of 40-45%. The cavity size was normal. There is mildly increased left ventricular wall thickness. Left ventricular diastolic Doppler parameters  are consistent with pseudonormalization. Elevated mean left atrial pressure Moderate hypokinesis of the left ventricular, basal-mid inferior wall and inferoseptal wall. Right Ventricle: The right ventricle has normal systolic function. The cavity was normal. There is no increase in right ventricular wall thickness. Right ventricular systolic pressure is mildly elevated with an estimated pressure of 41.4 mmHg. Left Atrium: left atrial size was mildly dilated.   Right Atrium: right atrial size was mildly dilated. Right atrial pressure is estimated at 8 mmHg. Interatrial Septum: No atrial level shunt detected by color flow Doppler. Pericardium: There is no evidence of  pericardial effusion. Mitral Valve: The mitral valve is normal in structure. Mild thickening of the mitral valve leaflet. There is moderate mitral annular calcification present. Mitral valve regurgitation is moderate by color flow Doppler. The MR jet is centrally-directed. Tricuspid Valve: The tricuspid valve is normal in structure. Tricuspid valve regurgitation is mild by color flow Doppler. Aortic Valve: The aortic valve is tricuspid Moderate thickening of the aortic valve. Moderate calcification of the aortic valve. Aortic valve regurgitation was not visualized by color flow Doppler. There is mild-moderate stenosis of the aortic valve,  with a calculated valve area of 1.61 cm. Pulmonic Valve: The pulmonic valve was grossly normal. Pulmonic valve regurgitation is not visualized by color flow Doppler. Aorta: The aortic root and ascending aorta are normal in size and structure. Venous: The inferior vena cava is normal in size with less than 50% respiratory variability. Compared to previous exam: 05/07/2018, there is reduction in left ventricular systolic function due to a new inferior/inferoseptal wall motion abnormality, suggesting ischemia due to restenosis in the right coronary arery. There is substantial worsening  of the mitral insufficiency, probably with an ischemic mechanism.     Patient Profile     85 y.o. female 85 y.o.femalewith known coronary artery disease and percutaneous revascularization in September 2019(DES to distal RCA and DES to proximal LAD),moderate aortic stenosis, hypertension, hyperlipidemia, type 2 diabetes mellitus, paroxysmal atrial fibrillation noted on previous 30-day event monitor, history of multiple TIAs and stroke in December 2019 now with acute on chronic ischemia in the same distribution, presents with small NSTEMI 09/03/2018, underwent new PCI-DES to both LAD and RCA 09/05/2018.  Assessment & Plan    1. NSTEMI:  new systolic dysfunction with EF 40-45% and  moderate hypokinesis of the LV basal-mid inferior wall and inferoseptal wall, new moderate MR. Successful DES to LAD and RCA for stenoses distal to previous stents. Now asymptomatic. - Continue aspirin, plavix, BB, and statin. Plan Eliquis + plavix after PCI. - To need to hold metformin for another 48 hours.   -On beta-blocker and ARB for mildly decreased LVEF.  Anticipate recovery of ejection fraction with resolution of ischemia.  2. CVA: found to have a subacute stroke on brain MRI after presenting with dysarthria. Felt to not be related to atrial fibrillation, however patient does have recent diagnosis of PAF. Recommended for plavix and eliquis after cardiac work-up for high CHAD2VASC score 9 (age 43, gender, HTN,  DM, CAD, reduced EF, CVA 2).  Plans for PT/OT/Speech therapy at home and a home health nurse.    3. PAF:  Will start Eliquis.  I believe using both aspirin and clopidogrel as well as Eliquis will lead to an unnecessarily high risk of bleeding in this 85 year old woman.  Will use clopidogrel monotherapy for her recent stents and Eliquis for stroke prevention.  Continue on clopidogrel for a minimum of another 12 months.  4. HTN: BP still mildy elevated but overall stable.  Increase metoprolol succinate back to 100 mg daily and losartan back to home dose of 100 mg daily  5. HLD: LDL 143 this admission. History of intolerance to high intensity statin. Crestor uptitrated to 10 mg daily this admission  6. AS: noted to be moderate on echo this admission.  -No symptoms attributable to aortic stenosis at this time  7. DM:  - To need to hold metformin for another 48 hours.  Together with the steroids administered for contrast allergy, this will likely make her glucose control a little less successful over the weekend.  Plan to discharge home later today after we have made the necessary arrangements for home health care and follow-up appointment in a couple of weeks with Dr. Bronson Ing  (tele-visit).   For questions or updates, please contact [place] Please consult [email or web address] for contact info under        Signed, Sanda Klein, MD  09/06/2018, 9:19 AM

## 2018-09-10 ENCOUNTER — Telehealth (HOSPITAL_COMMUNITY): Payer: Self-pay

## 2018-09-16 ENCOUNTER — Telehealth: Payer: Self-pay

## 2018-09-16 NOTE — Telephone Encounter (Signed)
Medications reviewed. Current from dc., pharmacy updated.      Virtual Visit Pre-Appointment Phone Call  Steps For Call:  1. Confirm consent - "In the setting of the current Covid19 crisis, you are scheduled for a (phone or video) visit with your provider on (date) at (time).  Just as we do with many in-office visits, in order for you to participate in this visit, we must obtain consent.  If you'd like, I can send this to your mychart (if signed up) or email for you to review.  Otherwise, I can obtain your verbal consent now.  All virtual visits are billed to your insurance company just like a normal visit would be.  By agreeing to a virtual visit, we'd like you to understand that the technology does not allow for your provider to perform an examination, and thus may limit your provider's ability to fully assess your condition.  Finally, though the technology is pretty good, we cannot assure that it will always work on either your or our end, and in the setting of a video visit, we may have to convert it to a phone-only visit.  In either situation, we cannot ensure that we have a secure connection.  Are you willing to proceed?"  2. Give patient instructions for WebEx download to smartphone as below if video visit  3. Advise patient to be prepared with any vital sign or heart rhythm information, their current medicines, and a piece of paper and pen handy for any instructions they may receive the day of their visit  4. Inform patient they will receive a phone call 15 minutes prior to their appointment time (may be from unknown caller ID) so they should be prepared to answer  5. Confirm that appointment type is correct in Epic appointment notes (video vs telephone)    TELEPHONE CALL NOTE  [name] has been deemed a candidate for a follow-up tele-health visit to limit community exposure during the Covid-19 pandemic. I spoke with the patient via phone to ensure availability of phone/video  source, confirm preferred email & phone number, and discuss instructions and expectations.  I reminded [name] to be prepared with any vital sign and/or heart rhythm information that could potentially be obtained via home monitoring, at the time of her visit. I reminded [name] to expect a phone call at the time of her visit if her visit.  Did the patient verbally acknowledge consent to treatment? Yes   Drema Dallas, [place] 09/16/2018 11:03 AM   DOWNLOADING THE [place]  - If Apple, go to [employer] and type in WebEx in the search bar. [place] [employer], the blue/green circle. The app is free but as with any other app downloads, their phone may require them to verify saved payment information or Apple password. The patient does NOT have to create an account.  - If Android, ask patient to go to [employer] and type in WebEx in the search bar. [place] [employer], the blue/green circle. The app is free but as with any other app downloads, their phone may require them to verify saved payment information or Android password. The patient does NOT have to create an account.   CONSENT FOR TELE-HEALTH VISIT - PLEASE REVIEW  I hereby voluntarily request, consent and authorize [facility] and its employed or contracted physicians, physician assistants, nurse practitioners or other licensed health care professionals (the Practitioner), to provide me with telemedicine health  care services (the "Services") as deemed necessary by the treating Practitioner. I acknowledge and consent to receive the Services by the Practitioner via telemedicine. I understand that the telemedicine visit will involve communicating with the Practitioner through live audiovisual communication technology and the disclosure of certain medical information by electronic transmission. I acknowledge that I have been given the opportunity to request an in-person assessment or other  available alternative prior to the telemedicine visit and am voluntarily participating in the telemedicine visit.  I understand that I have the right to withhold or withdraw my consent to the use of telemedicine in the course of my care at any time, without affecting my right to future care or treatment, and that the Practitioner or I may terminate the telemedicine visit at any time. I understand that I have the right to inspect all information obtained and/or recorded in the course of the telemedicine visit and may receive copies of available information for a reasonable fee.  I understand that some of the potential risks of receiving the Services via telemedicine include:  Marland Kitchen Delay or interruption in medical evaluation due to technological equipment failure or disruption; . Information transmitted may not be sufficient (e.g. poor resolution of images) to allow for appropriate medical decision making by the Practitioner; and/or  . In rare instances, security protocols could fail, causing a breach of personal health information.  Furthermore, I acknowledge that it is my responsibility to provide information about my medical history, conditions and care that is complete and accurate to the best of my ability. I acknowledge that Practitioner's advice, recommendations, and/or decision may be based on factors not within their control, such as incomplete or inaccurate data provided by me or distortions of diagnostic images or specimens that may result from electronic transmissions. I understand that the practice of medicine is not an exact science and that Practitioner makes no warranties or guarantees regarding treatment outcomes. I acknowledge that I will receive a copy of this consent concurrently upon execution via email to the email address I last provided but may also request a printed copy by calling the office of [facility].    I understand that my insurance will be billed for this visit.   I have  read or had this consent read to me. . I understand the contents of this consent, which adequately explains the benefits and risks of the Services being provided via telemedicine.  . I have been provided ample opportunity to ask questions regarding this consent and the Services and have had my questions answered to my satisfaction. . I give my informed consent for the services to be provided through the use of telemedicine in my medical care  By participating in this telemedicine visit I agree to the above.

## 2018-09-17 ENCOUNTER — Encounter: Payer: Self-pay | Admitting: Student

## 2018-09-17 ENCOUNTER — Telehealth (INDEPENDENT_AMBULATORY_CARE_PROVIDER_SITE_OTHER): Payer: Medicare Other | Admitting: Student

## 2018-09-17 VITALS — BP 142/78 | HR 62 | Temp 97.2°F | Ht 64.0 in | Wt 153.0 lb

## 2018-09-17 DIAGNOSIS — Z8673 Personal history of transient ischemic attack (TIA), and cerebral infarction without residual deficits: Secondary | ICD-10-CM

## 2018-09-17 DIAGNOSIS — I25118 Atherosclerotic heart disease of native coronary artery with other forms of angina pectoris: Secondary | ICD-10-CM | POA: Diagnosis not present

## 2018-09-17 DIAGNOSIS — I10 Essential (primary) hypertension: Secondary | ICD-10-CM

## 2018-09-17 DIAGNOSIS — I255 Ischemic cardiomyopathy: Secondary | ICD-10-CM

## 2018-09-17 DIAGNOSIS — I350 Nonrheumatic aortic (valve) stenosis: Secondary | ICD-10-CM

## 2018-09-17 DIAGNOSIS — I340 Nonrheumatic mitral (valve) insufficiency: Secondary | ICD-10-CM

## 2018-09-17 DIAGNOSIS — Z7189 Other specified counseling: Secondary | ICD-10-CM

## 2018-09-17 DIAGNOSIS — I480 Paroxysmal atrial fibrillation: Secondary | ICD-10-CM

## 2018-09-17 DIAGNOSIS — E785 Hyperlipidemia, unspecified: Secondary | ICD-10-CM

## 2018-09-17 NOTE — Progress Notes (Signed)
Virtual Visit via Telephone Note   This visit type was conducted due to national recommendations for restrictions regarding the COVID-19 Pandemic (e.g. social distancing) in an effort to limit this patient's exposure and mitigate transmission in our community.  Due to her co-morbid illnesses, this patient is at least at moderate risk for complications without adequate follow up.  This format is felt to be most appropriate for this patient at this time.  The patient did not have access to video technology/had technical difficulties with video requiring transitioning to audio format only (telephone).  All issues noted in this document were discussed and addressed.  No physical exam could be performed with this format.  Please refer to the patient's chart for her  consent to telehealth for [facility].   Evaluation Performed:  Follow-up visit  Date:  09/17/2018   ID:  [name], DOB July 07, 1927, MRN [id number]  Patient Location: Home  Provider Location: Home  PCP:  Loman Brooklyn, FNP  Cardiologist:  Kate Sable, MD Electrophysiologist:  None   Chief Complaint:  Hospital Follow-Up  History of Present Illness:    [name] is a 85 y.o. female who presents via audio/video conferencing for a telehealth visit today. Past medical history includes CAD (s/p DES to distal RCA and DES to proximal LAD in 02/2018), moderate AS, HTN, HLD, Type 2 DM, CVA (in 05/2018, with prior TIAs), SVT, PAF (on Eliquis), and dementia.   She recently presented to [facility] on 09/03/2018 for evaluation of left arm pain and dyspnea, found to have ST abnormalities along the inferolateral leads and elevated troponin values (peaking at 1.90). She underwent a repeat echocardiogram which showed a newly reduced EF of 40-45%, HK of the inferoseptal walls, moderate MR and moderate AS. She was transferred to [facility] for likely catheterization. This was delayed by one day due to her experiencing intermittent  confusion and transient dysarthria prior to admission. Repeat imaging showed an acute CVA with new mesial L temporal lobe subacute to chronic infarct w/ 11m DWI positive area. Neurology was consulted during admission who assisted with management and recommended Plavix and Eliquis. PT and OT were consulted with Home Health recommended. She eventually underwent cath on 09/05/2018 and was found to have severe stenosis of the mid LAD just beyond the patent stented segment which was treated with PCI/DES and severe stenosis of the distal RCA just beyond the patent stented segment treated with PCI/DES x 1. Was initially recommended to be on triple therapy for 1 month but she was discharged on Plavix and Eliquis alone due to concerns of increased bleeding risk.   In talking with the patient and her husband today, she reports doing well since her recent hospitalization. Says they have "been lazy" since returning home and not going anywhere due to the Coronavirus. Home Health PT, OT, and Speech Therapy are coming out multiple times per week to work with the patient. She does not recall having stents placed during admission but denies any recent chest pain or dyspnea.   Her husband is managing her medications and says she has been tolerating her current regimen well. No reports of melena, hematochezia, or hematuria. She denies any recent orthopnea, PND, or edema. Weight has been stable on her home scales.   The patient does not have symptoms concerning for COVID-19 infection (fever, chills, cough, or new shortness of breath).    Past Medical History:  Diagnosis Date  . Allergic rhinitis   . Allergy to  IVP dye   . Arthritis    "probably in my hands" (02/26/2018)  . Colonic polyp   . Coronary artery disease    a. cath 02/2018 PCI with DES x1 to the distal RCA and DES x1 to the proximal LAD. b. NSTEMI 09/2018 s/p DES to dRCA and DES to prox LAD just beyond stented segments, residual moderate OM2 disease for medical  therapy.  . CVA (cerebral vascular accident) ([place])   . Dementia ([place])   . GERD (gastroesophageal reflux disease)   . History of blood transfusion    "don't remember why or when" (02/26/2018)  . Hyperlipidemia   . Hypertension   . Ischemic cardiomyopathy    a. EF 40-45% by echo 09/2018.  . Moderate aortic stenosis   . Moderate mitral regurgitation   . Nonalcoholic hepatosteatosis   . PAF (paroxysmal atrial fibrillation) ([place])   . TIA (transient ischemic attack) 2000s   "I've had ~ 3" (02/26/2018)  . Type II diabetes mellitus ([place])    Past Surgical History:  Procedure Laterality Date  . ABDOMINAL HYSTERECTOMY    . APPENDECTOMY  1950s  . CARDIAC CATHETERIZATION  2001   Nonobstructive CAD  . CATARACT EXTRACTION W/ INTRAOCULAR LENS  IMPLANT, BILATERAL Bilateral 2000s  . CORONARY STENT INTERVENTION N/A 02/26/2018   Procedure: CORONARY STENT INTERVENTION;  Surgeon: Martinique, Peter M, MD;  Location: [place] [facility];  Service: Cardiovascular;  Laterality: N/A;  rca   . CORONARY STENT INTERVENTION N/A 09/05/2018   Procedure: CORONARY STENT INTERVENTION;  Surgeon: Burnell Blanks, MD;  Location: [facility];  Service: Cardiovascular;  Laterality: N/A;  . LEFT HEART CATH AND CORONARY ANGIOGRAPHY N/A 02/26/2018   Procedure: LEFT HEART CATH AND CORONARY ANGIOGRAPHY;  Surgeon: Martinique, Peter M, MD;  Location: [facility];  Service: Cardiovascular;  Laterality: N/A;  . LEFT HEART CATH AND CORONARY ANGIOGRAPHY N/A 09/05/2018   Procedure: LEFT HEART CATH AND CORONARY ANGIOGRAPHY;  Surgeon: Burnell Blanks, MD;  Location: [facility];  Service: Cardiovascular;  Laterality: N/A;  . TONSILLECTOMY  1943     Current Meds  Medication Sig  . carboxymethylcellulose (REFRESH) 1 % ophthalmic solution Place 1 drop into both eyes 2 (two) times daily as needed (dry eyes).   . clopidogrel (PLAVIX) 75 MG tablet Take 1 tablet (75 mg total) by mouth daily with breakfast.  . docusate  sodium (COLACE) 100 MG capsule Take 200 mg by mouth 2 (two) times daily as needed for mild constipation.   Marland Kitchen ELIQUIS 5 MG TABS tablet Take 1 tablet by mouth 2 (two) times daily.  . fexofenadine (ALLEGRA) 180 MG tablet Take 180 mg by mouth daily.    . fluticasone (FLONASE) 50 MCG/ACT nasal spray Place 2 sprays into both nostrils daily.   Marland Kitchen losartan (COZAAR) 100 MG tablet Take 100 mg by mouth daily.  . metFORMIN (GLUCOPHAGE-XR) 500 MG 24 hr tablet Take 1 tablet (500 mg total) by mouth 2 (two) times daily with a meal.  . metoprolol succinate (TOPROL-XL) 100 MG 24 hr tablet Take 100 mg by mouth daily. Take with or immediately following a meal.  . Multiple Vitamin (MULTIVITAMIN) tablet Take 1 tablet by mouth daily.    . nitroGLYCERIN (NITROSTAT) 0.4 MG SL tablet Place 0.4 mg under the tongue every 5 (five) minutes as needed for chest pain.  Marland Kitchen nystatin (MYCOSTATIN/NYSTOP) powder Apply 1 application topically 2 (two) times daily.   . pantoprazole (PROTONIX) 40 MG tablet Take 1 tablet (  40 mg total) by mouth daily.  . rosuvastatin (CRESTOR) 20 MG tablet Take 1 tablet (20 mg total) by mouth every evening.  . ursodiol (ACTIGALL) 300 MG capsule Take 300 mg by mouth 2 (two) times daily.      Allergies:   Ivp dye [iodinated diagnostic agents]; Sulfonamide derivatives; Lipitor [atorvastatin calcium]; Zetia [ezetimibe]; and Penicillins   Social History   Tobacco Use  . Smoking status: Former Smoker    Packs/day: 2.00    Years: 40.00    Pack years: 80.00    Types: Cigarettes    Last attempt to quit: 06/05/1992    Years since quitting: 26.3  . Smokeless tobacco: Former Systems developer    Types: Chew    Quit date: 12/03/1992  . Tobacco comment: used tobacco to help quit smoking  Substance Use Topics  . Alcohol use: Never    Frequency: Never  . Drug use: Never     Family Hx: The patient's family history includes Heart failure in her mother; Prostate cancer in her brother and father.  ROS:   Please see the  history of present illness.     All other systems reviewed and are negative.   Prior CV studies:   The following studies were reviewed today:  Echocardiogram: 09/2018 IMPRESSIONS    1. Moderate hypokinesis of the left ventricular, basal-mid inferior wall and inferoseptal wall.  2. The left ventricle has mild-moderately reduced systolic function, with an ejection fraction of 40-45%. The cavity size was normal. There is mildly increased left ventricular wall thickness. Left ventricular diastolic Doppler parameters are consistent  with pseudonormalization. Elevated mean left atrial pressure.  3. The right ventricle has normal systolic function. The cavity was normal. There is no increase in right ventricular wall thickness. Right ventricular systolic pressure is mildly elevated with an estimated pressure of 41.4 mmHg.  4. Left atrial size was mildly dilated.  5. Right atrial size was mildly dilated.  6. Mild thickening of the mitral valve leaflet. There is moderate mitral annular calcification present. Mitral valve regurgitation is moderate by color flow Doppler. The MR jet is centrally-directed.  7. The aortic valve is tricuspid. Moderate thickening of the aortic valve. Moderate calcification of the aortic valve. mild-moderate stenosis of the aortic valve.  8. The aortic root and ascending aorta are normal in size and structure.  9. The inferior vena cava was normal in size with <50% respiratory variability. 10. When compared to the prior study: 05/07/2018, there is reduction in left ventricular systolic function due to a new inferior/inferoseptal wall motion abnormality, suggesting ischemia due to restenosis in the right coronary arery. There is substantial  worsening of the mitral insufficiency, probably with an ischemic mechanism.  Cardiac Catheterization: 09/2018  Prox RCA to Mid RCA lesion is 30% stenosed.  Mid RCA lesion is 30% stenosed.  Previously placed Mid RCA to Dist RCA  stent (unknown type) is widely patent.  Dist RCA lesion is 99% stenosed.  A drug-eluting stent was successfully placed using a STENT SYNERGY DES 3X12.  Post intervention, there is a 0% residual stenosis.  Ost 2nd Mrg lesion is 70% stenosed.  Previously placed Ost LAD to Prox LAD stent (unknown type) is widely patent.  Prox LAD lesion is 90% stenosed.  A drug-eluting stent was successfully placed using a STENT SYNERGY DES 2.75X12.  Post intervention, there is a 0% residual stenosis.   1. Severe stenosis mid LAD just beyond the patent stented segment. Successful PTCA/DES x 1 mid LAD 2. Severe  stenosis distal RCA just beyond the patent stented segment. Successful PTCA/DES x 1 distal RCA 3. Moderate stenosis ostium of the small caliber second obtuse marginal branch.    Labs/Other Tests and Data Reviewed:    EKG:  An ECG dated 09/06/2018 was personally reviewed today and demonstrated:  sinus tachycardia, HR 104, with inferior infarct pattern  Recent Labs: 09/03/2018: ALT 11; B Natriuretic Peptide 415.0; Magnesium 1.4; TSH 2.120 09/06/2018: BUN 13; Creatinine, Ser 0.64; Hemoglobin 13.0; Platelets 215; Potassium 3.8; Sodium 140   Recent Lipid Panel Lab Results  Component Value Date/Time   CHOL 227 (H) 09/03/2018 06:28 AM   TRIG 146 09/03/2018 06:28 AM   HDL 55 09/03/2018 06:28 AM   CHOLHDL 4.1 09/03/2018 06:28 AM   LDLCALC 143 (H) 09/03/2018 06:28 AM    Wt Readings from Last 3 Encounters:  09/17/18 153 lb (69.4 kg)  09/06/18 151 lb 0.2 oz (68.5 kg)  06/03/18 166 lb (75.3 kg)     Objective:    Vital Signs:  BP (!) 142/78   Pulse 62   Temp (!) 97.2 F (36.2 C)   Ht 5' 4"  (1.626 m)   Wt 153 lb (69.4 kg)   SpO2 96%   BMI 26.26 kg/m    General: Pleasant female sounding in NAD Psych: Normal affect. Neuro: Alert and oriented X 3.  Lungs: Breathing does not sound labored while talking on the phone.    ASSESSMENT & PLAN:    1. CAD - she is s/p DES to distal RCA and DES  to proximal LAD in 02/2018. Recently admitted for an NSTEMI and was found to have severe stenosis of the mid LAD just beyond the patent stented segment which was treated with PCI/DES and severe stenosis of the distal RCA just beyond the patent stented segment treated with PCI/DES x 1.  - denies any recurrent chest pain or dyspnea on exertion.  - continue Plavix, BB, and statin therapy. Not on ASA given the need for anticoagulation and high-risk of bleeding with triple therapy.   2. Ischemic Cardiomyopathy - EF was 40-45% by echo in 08/2018. She denies any recent orthopnea, PND, or edema and weight has been stable when checked at home.  - continue with Losartan and Toprol-XL. She has not required diuretic therapy.   3. Paroxysmal Atrial Fibrillation - she denies any recent palpitations. Remains on Toprol-XL for rate-control.  - no evidence of active bleeding. Continue Eliquis for anticoagulation.   4. Aortic Stenosis/Mitral Regurgitation - had moderate AS and moderate MR by recent echocardiogram. Continue to follow.    5. HLD - Crestor was recently titrated to 34m daily during recent admission. Will need repeat FLP/LFT's in 6-8 weeks.   6. HTN - BP slightly elevated at 142/78 today but has overall been well-controlled when checked at home. Continue current regimen for now including Losartan and Toprol-XL.   7. Recent CVA - as outlined above. Followed by Neurology. Remains on Eliquis, Plavix, and statin therapy.   8. COVID-19 Education The signs and symptoms of COVID-19 were discussed with the patient. The importance of social distancing was discussed today.  Time:   Today, I have spent 17 minutes with the patient with telehealth technology discussing the above problems.     Medication Adjustments/Labs and Tests Ordered: Current medicines are reviewed at length with the patient today.  Concerns regarding medicines are outlined above.   Tests Ordered: No orders of the defined types  were placed in this encounter.   Medication Changes:  No orders of the defined types were placed in this encounter.   Disposition:  Follow up with Dr. Bronson Ing in 2-3 months.   Signed, Erma Heritage, PA-C  09/17/2018 7:08 PM    [facility]

## 2018-09-17 NOTE — Patient Instructions (Signed)
Medication Instructions:  Your physician recommends that you continue on your current medications as directed. Please refer to the Current Medication list given to you today.   Labwork: NONE  Testing/Procedures: None   Follow-Up: Your physician recommends that you schedule a follow-up appointment in: 2-3 Months   Any Other Special Instructions Will Be Listed Below (If Applicable).     If you need a refill on your cardiac medications before your next appointment, please call your pharmacy.  Thank you for choosing [facility]!

## 2018-12-03 ENCOUNTER — Encounter: Payer: Self-pay | Admitting: Student

## 2018-12-03 ENCOUNTER — Telehealth (INDEPENDENT_AMBULATORY_CARE_PROVIDER_SITE_OTHER): Payer: Medicare Other | Admitting: Student

## 2018-12-03 ENCOUNTER — Other Ambulatory Visit: Payer: Self-pay

## 2018-12-03 VITALS — BP 138/40 | HR 65 | Temp 97.2°F | Wt 156.0 lb

## 2018-12-03 DIAGNOSIS — I2510 Atherosclerotic heart disease of native coronary artery without angina pectoris: Secondary | ICD-10-CM | POA: Diagnosis not present

## 2018-12-03 DIAGNOSIS — I10 Essential (primary) hypertension: Secondary | ICD-10-CM

## 2018-12-03 DIAGNOSIS — Z8673 Personal history of transient ischemic attack (TIA), and cerebral infarction without residual deficits: Secondary | ICD-10-CM

## 2018-12-03 DIAGNOSIS — I255 Ischemic cardiomyopathy: Secondary | ICD-10-CM

## 2018-12-03 DIAGNOSIS — I38 Endocarditis, valve unspecified: Secondary | ICD-10-CM

## 2018-12-03 DIAGNOSIS — I480 Paroxysmal atrial fibrillation: Secondary | ICD-10-CM

## 2018-12-03 DIAGNOSIS — E785 Hyperlipidemia, unspecified: Secondary | ICD-10-CM

## 2018-12-03 NOTE — Patient Instructions (Signed)
Medication Instructions:  Your physician recommends that you continue on your current medications as directed. Please refer to the Current Medication list given to you today.  If you need a refill on your cardiac medications before your next appointment, please call your pharmacy.   Lab work: NONE  If you have labs (blood work) drawn today and your tests are completely normal, you will receive your results only by: Marland Kitchen MyChart Message (if you have MyChart) OR . A paper copy in the mail If you have any lab test that is abnormal or we need to change your treatment, we will call you to review the results.  Testing/Procedures: NONE   Follow-Up: At [facility], you and your health needs are our priority.  As part of our continuing mission to provide you with exceptional heart care, we have created designated Provider Care Teams.  These Care Teams include your primary Cardiologist (physician) and Advanced Practice Providers (APPs -  Physician Assistants and Nurse Practitioners) who all work together to provide you with the care you need, when you need it. You will need a follow up appointment in 4 months.  Please call our office 2 months in advance to schedule this appointment.  You may see Kate Sable, MD or one of the following Advanced Practice Providers on your designated Care Team:   Bernerd Pho, PA-C [facility]) . Ermalinda Barrios, PA-C ([place])  Any Other Special Instructions Will Be Listed Below (If Applicable). Thank you for choosing [facility]!

## 2018-12-03 NOTE — Progress Notes (Signed)
Virtual Visit via Telephone Note   This visit type was conducted due to national recommendations for restrictions regarding the COVID-19 Pandemic (e.g. social distancing) in an effort to limit this patient's exposure and mitigate transmission in our community.  Due to her co-morbid illnesses, this patient is at least at moderate risk for complications without adequate follow up.  This format is felt to be most appropriate for this patient at this time.  The patient did not have access to video technology/had technical difficulties with video requiring transitioning to audio format only (telephone).  All issues noted in this document were discussed and addressed.  No physical exam could be performed with this format.  Please refer to the patient's chart for her  consent to telehealth for [facility].   Date:  12/03/2018   ID:  [name], DOB 1927-12-02, MRN [id number]  Patient Location: Home Provider Location: Office  PCP:  Loman Brooklyn, FNP  Cardiologist:  Kate Sable, MD  Electrophysiologist:  None   Evaluation Performed:  Follow-Up Visit  Chief Complaint: 2 month visit  History of Present Illness:    [name] is a 85 y.o. female with past medical history of CAD (s/p DES to distal RCA and DES to proximal LAD in 02/2018, DES to mid-LAD and DES to dRCA in 09/2018), ischemic cardiomyopathy (EF 40-45% by echo in 09/2018), moderate AS, PAF, HTN, HLD, Type 2 DM, and prior CVA (occurring in 05/2018 with repeat CVA in 08/2018), and dementia who presents for a 71-monthtelehealth follow-up visit.   She most recently had a telehealth visit with myself in 09/2018 following her recent admission for an NSTEMI and echo at that time showed a newly reduced EF of 40-45%. Repeat catheterization was performed and showed severe stenosis of the mid LAD just beyond the patent stented segment which was treated with PCI/DES and severe stenosis of the distal RCA just beyond the patent stented  segment treated with PCI/DES x 1. Her hospitalization was complicated by an acute CVA prior to her catheterization and she was discharged on Plavix and Eliquis with ASA being discontinued given her increased bleeding risk.   At the time of her telehealth visit, she reported overall doing well and was working with [place] and OT multiple times per week. Denied any repeat episodes of chest pain or dyspnea on exertion. Was continued on her current medication regimen at that time including Plavix, BB, statin, and Eliquis.  In talking with the patient and her husband today, she reports overall doing well from a cardiac perspective since her last office visit. Denies any recent chest pain, dyspnea on exertion, orthopnea, PND or edema. No recent palpitations, dizziness, or presyncope. She does ambulate with a walker and denies any recent falls.   In reviewing her medications, she was on Plavix and Eliquis at the time of her last visit but is now listed as being on ASA and Eliquis. The patient's husband reports Plavix was switched to ASA by Dr. DMerlene Laughterin 10/2018 and he reported communicating with [place] make that switch. I do not see any communication in EPIC regarding her medication adjustments at that time.   The patient does not have symptoms concerning for COVID-19 infection (fever, chills, cough, or new shortness of breath).     Past Medical History:  Diagnosis Date  . Allergic rhinitis   . Allergy to IVP dye   . Arthritis    "probably in my hands" (02/26/2018)  . Colonic  polyp   . Coronary artery disease    a. cath 02/2018 PCI with DES x1 to the distal RCA and DES x1 to the proximal LAD. b. NSTEMI 09/2018 s/p DES to dRCA and DES to prox LAD just beyond stented segments, residual moderate OM2 disease for medical therapy.  . CVA (cerebral vascular accident) ([place])   . Dementia ([place])   . GERD (gastroesophageal reflux disease)   . History of blood transfusion    "don't remember why or when"  (02/26/2018)  . Hyperlipidemia   . Hypertension   . Ischemic cardiomyopathy    a. EF 40-45% by echo 09/2018.  . Moderate aortic stenosis   . Moderate mitral regurgitation   . Nonalcoholic hepatosteatosis   . PAF (paroxysmal atrial fibrillation) ([place])   . TIA (transient ischemic attack) 2000s   "I've had ~ 3" (02/26/2018)  . Type II diabetes mellitus ([place])    Past Surgical History:  Procedure Laterality Date  . ABDOMINAL HYSTERECTOMY    . APPENDECTOMY  1950s  . CARDIAC CATHETERIZATION  2001   Nonobstructive CAD  . CATARACT EXTRACTION W/ INTRAOCULAR LENS  IMPLANT, BILATERAL Bilateral 2000s  . CORONARY STENT INTERVENTION N/A 02/26/2018   Procedure: CORONARY STENT INTERVENTION;  Surgeon: Martinique, Peter M, MD;  Location: [place] [facility];  Service: Cardiovascular;  Laterality: N/A;  rca   . CORONARY STENT INTERVENTION N/A 09/05/2018   Procedure: CORONARY STENT INTERVENTION;  Surgeon: Burnell Blanks, MD;  Location: [place] [facility];  Service: Cardiovascular;  Laterality: N/A;  . LEFT HEART CATH AND CORONARY ANGIOGRAPHY N/A 02/26/2018   Procedure: LEFT HEART CATH AND CORONARY ANGIOGRAPHY;  Surgeon: Martinique, Peter M, MD;  Location: [place] [facility];  Service: Cardiovascular;  Laterality: N/A;  . LEFT HEART CATH AND CORONARY ANGIOGRAPHY N/A 09/05/2018   Procedure: LEFT HEART CATH AND CORONARY ANGIOGRAPHY;  Surgeon: Burnell Blanks, MD;  Location: [place] [facility];  Service: Cardiovascular;  Laterality: N/A;  . TONSILLECTOMY  1943     Current Meds  Medication Sig  . aspirin EC 81 MG tablet Take 81 mg by mouth daily.  . carboxymethylcellulose (REFRESH) 1 % ophthalmic solution Place 1 drop into both eyes 2 (two) times daily as needed (dry eyes).   Marland Kitchen docusate sodium (COLACE) 100 MG capsule Take 200 mg by mouth 2 (two) times daily as needed for mild constipation.   Marland Kitchen ELIQUIS 5 MG TABS tablet Take 1 tablet by mouth 2 (two) times daily.  . fexofenadine (ALLEGRA) 180 MG tablet Take  180 mg by mouth daily.    . fluticasone (FLONASE) 50 MCG/ACT nasal spray Place 2 sprays into both nostrils daily.   Marland Kitchen losartan (COZAAR) 50 MG tablet   . metFORMIN (GLUCOPHAGE-XR) 500 MG 24 hr tablet Take 1 tablet (500 mg total) by mouth 2 (two) times daily with a meal.  . metoprolol succinate (TOPROL-XL) 100 MG 24 hr tablet Take 100 mg by mouth daily. Take with or immediately following a meal.  . Multiple Vitamin (MULTIVITAMIN) tablet Take 1 tablet by mouth daily.    . nitroGLYCERIN (NITROSTAT) 0.4 MG SL tablet Place 0.4 mg under the tongue every 5 (five) minutes as needed for chest pain.  Marland Kitchen nystatin (MYCOSTATIN/NYSTOP) powder Apply 1 application topically 2 (two) times daily.   . pantoprazole (PROTONIX) 40 MG tablet Take 1 tablet (40 mg total) by mouth daily.  . rosuvastatin (CRESTOR) 20 MG tablet Take 1 tablet (20 mg total) by mouth every evening.  . ursodiol (ACTIGALL)  300 MG capsule Take 300 mg by mouth 2 (two) times daily.      Allergies:   Ivp dye [iodinated diagnostic agents], Sulfonamide derivatives, Lipitor [atorvastatin calcium], Zetia [ezetimibe], and Penicillins   Social History   Tobacco Use  . Smoking status: Former Smoker    Packs/day: 2.00    Years: 40.00    Pack years: 80.00    Types: Cigarettes    Quit date: 06/05/1992    Years since quitting: 26.5  . Smokeless tobacco: Former Systems developer    Types: Chew    Quit date: 12/03/1992  . Tobacco comment: used tobacco to help quit smoking  Substance Use Topics  . Alcohol use: Never    Frequency: Never  . Drug use: Never     Family Hx: The patient's family history includes Heart failure in her mother; Prostate cancer in her brother and father.  ROS:   Please see the history of present illness.     All other systems reviewed and are negative.   Prior CV studies:   The following studies were reviewed today:  Echocardiogram: 09/04/2018 IMPRESSIONS    1. Moderate hypokinesis of the left ventricular, basal-mid inferior  wall and inferoseptal wall.  2. The left ventricle has mild-moderately reduced systolic function, with an ejection fraction of 40-45%. The cavity size was normal. There is mildly increased left ventricular wall thickness. Left ventricular diastolic Doppler parameters are consistent  with pseudonormalization. Elevated mean left atrial pressure.  3. The right ventricle has normal systolic function. The cavity was normal. There is no increase in right ventricular wall thickness. Right ventricular systolic pressure is mildly elevated with an estimated pressure of 41.4 mmHg.  4. Left atrial size was mildly dilated.  5. Right atrial size was mildly dilated.  6. Mild thickening of the mitral valve leaflet. There is moderate mitral annular calcification present. Mitral valve regurgitation is moderate by color flow Doppler. The MR jet is centrally-directed.  7. The aortic valve is tricuspid. Moderate thickening of the aortic valve. Moderate calcification of the aortic valve. mild-moderate stenosis of the aortic valve.  8. The aortic root and ascending aorta are normal in size and structure.  9. The inferior vena cava was normal in size with <50% respiratory variability. 10. When compared to the prior study: 05/07/2018, there is reduction in left ventricular systolic function due to a new inferior/inferoseptal wall motion abnormality, suggesting ischemia due to restenosis in the right coronary arery. There is substantial  worsening of the mitral insufficiency, probably with an ischemic mechanism.  Cardiac Catheterization: 09/05/2018  Prox RCA to Mid RCA lesion is 30% stenosed.  Mid RCA lesion is 30% stenosed.  Previously placed Mid RCA to Dist RCA stent (unknown type) is widely patent.  Dist RCA lesion is 99% stenosed.  A drug-eluting stent was successfully placed using a STENT SYNERGY DES 3X12.  Post intervention, there is a 0% residual stenosis.  Ost 2nd Mrg lesion is 70% stenosed.  Previously  placed Ost LAD to Prox LAD stent (unknown type) is widely patent.  Prox LAD lesion is 90% stenosed.  A drug-eluting stent was successfully placed using a STENT SYNERGY DES 2.75X12.  Post intervention, there is a 0% residual stenosis.   1. Severe stenosis mid LAD just beyond the patent stented segment. Successful PTCA/DES x 1 mid LAD 2. Severe stenosis distal RCA just beyond the patent stented segment. Successful PTCA/DES x 1 distal RCA 3. Moderate stenosis ostium of the small caliber second obtuse marginal branch.  Labs/Other Tests and Data Reviewed:    EKG:  No ECG reviewed.  Recent Labs: 09/03/2018: ALT 11; B Natriuretic Peptide 415.0; Magnesium 1.4; TSH 2.120 09/06/2018: BUN 13; Creatinine, Ser 0.64; Hemoglobin 13.0; Platelets 215; Potassium 3.8; Sodium 140   Recent Lipid Panel Lab Results  Component Value Date/Time   CHOL 227 (H) 09/03/2018 06:28 AM   TRIG 146 09/03/2018 06:28 AM   HDL 55 09/03/2018 06:28 AM   CHOLHDL 4.1 09/03/2018 06:28 AM   LDLCALC 143 (H) 09/03/2018 06:28 AM    Wt Readings from Last 3 Encounters:  12/03/18 156 lb (70.8 kg)  09/17/18 153 lb (69.4 kg)  09/06/18 151 lb 0.2 oz (68.5 kg)     Objective:    Vital Signs:  BP (!) 138/40   Pulse 65   Temp (!) 97.2 F (36.2 C)   Wt 156 lb (70.8 kg)   SpO2 97%   BMI 26.78 kg/m    General: Pleasant female sounding in NAD Psych: Normal affect. Neuro: Alert and oriented X 3.  Lungs:  Resp regular and unlabored while talking on the phone.  ASSESSMENT & PLAN:    1. CAD - she is s/p DES to distal RCA and DES to proximal LAD in 02/2018 with DES to mid-LAD and DES to dRCA in 09/2018. - she denies any recent chest pain or dyspnea on exertion.  - was initially discharged on Plavix and Eliquis as she was thought to be too high-risk for triple therapy. In reviewing her medication regimen, it appears Plavix was discontinued and switched to ASA by Neurology. I am concerned this was done in the setting of her  CVA and her recent stent placement might not have been addressed as the reasoning for being on Plavix. I will send a staff message to Dr. Bronson Ing and Dr. Merlene Laughter to address this as I anticipate she should be on Plavix and Eliquis for at least 6 months following her recent stents unless this was discontinued for other reasons. Continue BB and statin therapy.   2. Ischemic Cardiomyopathy - EF 40-45% by most recent study. She denies any recent orthopnea, PND, or edema. Weight has been stable on her home scales. Will continue on Losartan and Toprol-XL. She has not required the use of diuretic therapy.   3. Paroxysmal Atrial Fibrillation - she denies any recent palpitations. Continue Toprol-XL for rate-control. - denies any evidence of active bleeding. Remains on Eliquis 18m BID for anticoagulation (only indication for reduced dosing at this time is age).    4. Valvular Heart Disease - moderate AS and moderate MR by most recent echo in 09/2018. Will continue to follow.   5. HTN - BP has been well-controlled when checked at home. Continue current medication regimen with Losartan 532mdaily and Toprol-XL 10047maily.   6. HLD - FLP in 08/2018 showed LDL was elevated to 143. Was restarted on statin therapy at that time. Remains on Crestor 49m45mily.   7. History of CVA - followed by Neurology. Will include Dr. DoonMerlene Laughterstaff message to verify is she should be on ASA/Eliquis or Plavix/Eliquis.   COVID-19 Education: The signs and symptoms of COVID-19 were discussed with the patient and how to seek care for testing (follow up with PCP or arrange E-visit). The importance of social distancing was discussed today.  Time:   Today, I have spent 15 minutes with the patient with telehealth technology discussing the above problems.     Medication Adjustments/Labs and Tests Ordered: Current medicines  are reviewed at length with the patient today.  Concerns regarding medicines are outlined above.    Tests Ordered: No orders of the defined types were placed in this encounter.   Medication Changes: No orders of the defined types were placed in this encounter.   Follow Up: If Office Visit with Dr. Bronson Ing in 4 months.   Signed, Erma Heritage, PA-C  12/03/2018 5:04 PM    [facility]

## 2018-12-04 ENCOUNTER — Telehealth: Payer: Self-pay | Admitting: Student

## 2018-12-04 MED ORDER — CLOPIDOGREL BISULFATE 75 MG PO TABS: 75.0000 mg | ORAL_TABLET | Freq: Every day | ORAL | Status: DC

## 2018-12-04 NOTE — Telephone Encounter (Signed)
    Confirmed with Dr. Bronson Ing the patient should be on Plavix 22m daily and Eliquis 576mBID given her recent stent and will discontinue ASA.   I contacted the patient's husband and made him aware. Medication list in Epic has been updated.   Signed, BrErma HeritagePA-C 12/04/2018, 8:45 AM Pager: [phone]

## 2018-12-09 ENCOUNTER — Telehealth: Payer: Self-pay | Admitting: Cardiovascular Disease

## 2018-12-09 NOTE — Telephone Encounter (Signed)
Husband got AVS that still had ASA on it and no plavix.His AVS was updated on 12/04/18 by Bernerd Pho PA-C

## 2018-12-09 NOTE — Telephone Encounter (Signed)
Left message requesting someone please give her a call-- did not state why   [phone]

## 2019-09-29 ENCOUNTER — Emergency Department (HOSPITAL_COMMUNITY): Payer: Medicare Other

## 2019-09-29 ENCOUNTER — Other Ambulatory Visit: Payer: Self-pay

## 2019-09-29 ENCOUNTER — Observation Stay (HOSPITAL_COMMUNITY)
Admission: EM | Admit: 2019-09-29 | Discharge: 2019-09-30 | Disposition: A | Discharge status: Home / Self Care | Payer: Medicare Other | Attending: Internal Medicine | Admitting: Internal Medicine

## 2019-09-29 DIAGNOSIS — I2510 Atherosclerotic heart disease of native coronary artery without angina pectoris: Secondary | ICD-10-CM | POA: Insufficient documentation

## 2019-09-29 DIAGNOSIS — K219 Gastro-esophageal reflux disease without esophagitis: Secondary | ICD-10-CM | POA: Diagnosis not present

## 2019-09-29 DIAGNOSIS — Z87891 Personal history of nicotine dependence: Secondary | ICD-10-CM | POA: Insufficient documentation

## 2019-09-29 DIAGNOSIS — I69322 Dysarthria following cerebral infarction: Secondary | ICD-10-CM | POA: Diagnosis present

## 2019-09-29 DIAGNOSIS — I252 Old myocardial infarction: Secondary | ICD-10-CM | POA: Insufficient documentation

## 2019-09-29 DIAGNOSIS — Z20822 Contact with and (suspected) exposure to covid-19: Secondary | ICD-10-CM | POA: Insufficient documentation

## 2019-09-29 DIAGNOSIS — E119 Type 2 diabetes mellitus without complications: Secondary | ICD-10-CM | POA: Diagnosis not present

## 2019-09-29 DIAGNOSIS — I10 Essential (primary) hypertension: Secondary | ICD-10-CM | POA: Diagnosis present

## 2019-09-29 DIAGNOSIS — G459 Transient cerebral ischemic attack, unspecified: Secondary | ICD-10-CM | POA: Diagnosis not present

## 2019-09-29 DIAGNOSIS — Z8673 Personal history of transient ischemic attack (TIA), and cerebral infarction without residual deficits: Secondary | ICD-10-CM | POA: Diagnosis not present

## 2019-09-29 DIAGNOSIS — Z7984 Long term (current) use of oral hypoglycemic drugs: Secondary | ICD-10-CM | POA: Diagnosis not present

## 2019-09-29 DIAGNOSIS — I509 Heart failure, unspecified: Secondary | ICD-10-CM | POA: Diagnosis not present

## 2019-09-29 DIAGNOSIS — I110 Hypertensive heart disease with heart failure: Secondary | ICD-10-CM | POA: Insufficient documentation

## 2019-09-29 DIAGNOSIS — E785 Hyperlipidemia, unspecified: Secondary | ICD-10-CM | POA: Diagnosis not present

## 2019-09-29 DIAGNOSIS — Z7901 Long term (current) use of anticoagulants: Secondary | ICD-10-CM | POA: Diagnosis not present

## 2019-09-29 LAB — DIFFERENTIAL
Abs Immature Granulocytes: 0.02 10*3/uL (ref 0.00–0.07)
Basophils Absolute: 0 10*3/uL (ref 0.0–0.1)
Basophils Relative: 1 %
Eosinophils Absolute: 0.2 10*3/uL (ref 0.0–0.5)
Eosinophils Relative: 2 %
Immature Granulocytes: 0 %
Lymphocytes Relative: 28 %
Lymphs Abs: 1.9 10*3/uL (ref 0.7–4.0)
Monocytes Absolute: 0.7 10*3/uL (ref 0.1–1.0)
Monocytes Relative: 10 %
Neutro Abs: 4.1 10*3/uL (ref 1.7–7.7)
Neutrophils Relative %: 59 %

## 2019-09-29 LAB — COMPREHENSIVE METABOLIC PANEL
ALT: 13 U/L (ref 0–44)
AST: 13 U/L — ABNORMAL LOW (ref 15–41)
Albumin: 4.3 g/dL (ref 3.5–5.0)
Alkaline Phosphatase: 94 U/L (ref 38–126)
Anion gap: 10 (ref 5–15)
BUN: 20 mg/dL (ref 8–23)
CO2: 25 mmol/L (ref 22–32)
Calcium: 9.4 mg/dL (ref 8.9–10.3)
Chloride: 103 mmol/L (ref 98–111)
Creatinine, Ser: 0.57 mg/dL (ref 0.44–1.00)
GFR calc Af Amer: >60 mL/min (ref >60)
GFR calc non Af Amer: >60 mL/min (ref >60)
Glucose, Bld: 218 mg/dL — ABNORMAL HIGH (ref 70–99)
Potassium: 3.9 mmol/L (ref 3.5–5.1)
Sodium: 138 mmol/L (ref 135–145)
Total Bilirubin: 0.3 mg/dL (ref 0.3–1.2)
Total Protein: 7.3 g/dL (ref 6.5–8.1)

## 2019-09-29 LAB — CBC
HCT: 38.7 % (ref 36.0–46.0)
Hemoglobin: 12.8 g/dL (ref 12.0–15.0)
MCH: 31.4 pg (ref 26.0–34.0)
MCHC: 33.1 g/dL (ref 30.0–36.0)
MCV: 95.1 fL (ref 80.0–100.0)
Platelets: 187 K/uL (ref 150–400)
RBC: 4.07 MIL/uL (ref 3.87–5.11)
RDW: 12.1 % (ref 11.5–15.5)
WBC: 6.9 K/uL (ref 4.0–10.5)
nRBC: 0 % (ref 0.0–0.2)

## 2019-09-29 LAB — I-STAT CHEM 8, ED
BUN: 20 mg/dL (ref 8–23)
Calcium, Ion: 1.21 mmol/L (ref 1.15–1.40)
Chloride: 102 mmol/L (ref 98–111)
Creatinine, Ser: 0.5 mg/dL (ref 0.44–1.00)
Glucose, Bld: 208 mg/dL — ABNORMAL HIGH (ref 70–99)
HCT: 37 % (ref 36.0–46.0)
Hemoglobin: 12.6 g/dL (ref 12.0–15.0)
Potassium: 3.8 mmol/L (ref 3.5–5.1)
Sodium: 140 mmol/L (ref 135–145)
TCO2: 27 mmol/L (ref 22–32)

## 2019-09-29 LAB — RESPIRATORY PANEL BY RT PCR (FLU A&B, COVID)
Influenza A by PCR: NEGATIVE
Influenza B by PCR: NEGATIVE
SARS Coronavirus 2 by RT PCR: NEGATIVE

## 2019-09-29 LAB — PROTIME-INR
INR: 1.1 (ref 0.8–1.2)
Prothrombin Time: 13.7 seconds (ref 11.4–15.2)

## 2019-09-29 LAB — ETHANOL: Alcohol, Ethyl (B): <10 mg/dL

## 2019-09-29 LAB — APTT: aPTT: 34 s (ref 24–36)

## 2019-09-29 NOTE — ED Triage Notes (Signed)
EMS called out for possible code stroke. Family reports patient had slurred speech and was not acting normal reported by family. When EMS arrived patient speech was normal and cbg was 285. Patient had no weakness or deficits. Patient alert and oriented at this time. Patient has a past hx of CVA.

## 2019-09-29 NOTE — H&P (Addendum)
History and Physical    Patient Demographics:    [name]:[id number] DOB: 1928-03-20 DOA: 09/29/2019  PCP: Patient, No Pcp Per  Patient coming from: Home  I have personally briefly reviewed patient's old medical records in [place]  Chief Complaint: Slurred speech   Assessment & Plan:     Assessment/Plan Principal Problem:   TIA (transient ischemic attack) Active Problems:   Type II diabetes mellitus ([place])   Hyperlipidemia   Essential hypertension   Coronary atherosclerosis   GERD (gastroesophageal reflux disease)     Principal Problem: Possible TIA Patient has symptoms of aphasia, dysarthria lasting for several minutes.  Symptoms resolved on arrival to the ER. Has history of TIA x3 in the past.  Patient has mild left upper and lower extremity weakness on exam.  CT of the head is negative for acute intracranial abnormalities. -Telemetry monitoring -Neurochecks every 4 hours -MRI brain, echocardiogram -We will check TSH, B12 -Neurology consult in a.m. -We will allow for permissive hypertension   Other Active Problems: Coronary artery disease s/p DES to distal RCA and DES to proximal LAD in 02/2018, DES to mid-LAD and DES to dRCA in 09/2018 -Continue Plavix  Chronic congestive heart failure with reduced ejection fraction EF 40-45% by echo in 09/2018), moderate AS Patient is euvolemic currently.  Not on diuretics at home. -Monitor input output, renal function, daily weights  Hypertension -Hold amlodipine, metoprolol for tonight.  Allow for permissive hypertension. -Resume in a.m.  Hyperlipidemia -Continue Crestor  Diabetes mellitus type 2 -Hold oral hypoglycemics -Sliding scale insulin coverage with fingerstick monitoring  Paroxysmal atrial fibrillation Currently in sinus rhythm -Continue Eliquis  Dementia, likely Alzheimer's type Patient is mildly confused at baseline. -Neurochecks every 4 hours  GERD -Continue pantoprazole   DVT  prophylaxis: Lovenox Code Status:  Full code Family Communication: N/A  Disposition Plan: Place in observation for TIA work-up, MRI, neurology consult Consults called: N/A Admission status: Observation stay    HPI:     HPI: [name] is a 86 y.o. female with medical history significant of hypertension, hyperlipidemia, diabetes mellitus type 2, coronary artery disease, CHF, GERD, atrial fibrillation on anticoagulation who presented to the ER with speech abnormalities.  Patient has a history of dementia and is mildly confused at baseline.  History is mainly been obtained from the ER record as the patient is unable to provide a detailed history.  Patient was apparently being driven back to her house by her daughter when she started having trouble with speech.  She had word finding difficulty, had some garbled speech and was making some nonsense type sounds which are incomprehensible.  Patient states she felt like she knew what she wanted to say but was not able to get her words out properly.  This lasted for several minutes and resolved by itself.  On arrival to the ER her speech was back to normal.  At the time of my evaluation, the patient has normal speech but has mild confusion. No nausea, vomiting, abdominal pain, diarrhea, dysuria, chest pain, shortness of breath, palpitations, blurring of vision, seizures, syncope. ED Course:  Vital Signs reviewed on presentation, significant for temperature 97.8, HR 63, BP 173/63, saturation 93% on room air. Labs reviewed, significant for sodium 138, potassium 3.9, BUN 20, creatinine 0.5, LFTs within normal meds, WBC count 6.9, hemoglobin 12.2, hematocrit 38, platelets 187, PT/PTT within normal limits, alcohol level is negative.  SARS Covid RT-PCR, flu PCR negative. Imaging personally Reviewed, CT head shows no  acute intracranial abnormalities.  Atrophy and chronic small vessel ischemic change. EKG personally reviewed, shows sinus rhythm, no acute ST-T  changes.    Review of systems:    Review of Systems: Review of systems could not be done the patient is confused    Past Medical and Surgical History:  Reviewed by me  Past Medical History:  Diagnosis Date  . Allergic rhinitis   . Allergy to IVP dye   . Arthritis    "probably in my hands" (02/26/2018)  . Colonic polyp   . Coronary artery disease    a. cath 02/2018 PCI with DES x1 to the distal RCA and DES x1 to the proximal LAD. b. NSTEMI 09/2018 s/p DES to dRCA and DES to prox LAD just beyond stented segments, residual moderate OM2 disease for medical therapy.  . CVA (cerebral vascular accident) ([place])   . Dementia ([place])   . GERD (gastroesophageal reflux disease)   . History of blood transfusion    "don't remember why or when" (02/26/2018)  . Hyperlipidemia   . Hypertension   . Ischemic cardiomyopathy    a. EF 40-45% by echo 09/2018.  . Moderate aortic stenosis   . Moderate mitral regurgitation   . Nonalcoholic hepatosteatosis   . PAF (paroxysmal atrial fibrillation) ([place])   . TIA (transient ischemic attack) 2000s   "I've had ~ 3" (02/26/2018)  . Type II diabetes mellitus ([place])     Past Surgical History:  Procedure Laterality Date  . ABDOMINAL HYSTERECTOMY    . APPENDECTOMY  1950s  . CARDIAC CATHETERIZATION  2001   Nonobstructive CAD  . CATARACT EXTRACTION W/ INTRAOCULAR LENS  IMPLANT, BILATERAL Bilateral 2000s  . CORONARY STENT INTERVENTION N/A 02/26/2018   Procedure: CORONARY STENT INTERVENTION;  Surgeon: Martinique, Peter M, MD;  Location: [facility];  Service: Cardiovascular;  Laterality: N/A;  rca   . CORONARY STENT INTERVENTION N/A 09/05/2018   Procedure: CORONARY STENT INTERVENTION;  Surgeon: Burnell Blanks, MD;  Location: [facility];  Service: Cardiovascular;  Laterality: N/A;  . LEFT HEART CATH AND CORONARY ANGIOGRAPHY N/A 02/26/2018   Procedure: LEFT HEART CATH AND CORONARY ANGIOGRAPHY;  Surgeon: Martinique, Peter M, MD;  Location: [facility];  Service: Cardiovascular;  Laterality: N/A;  . LEFT HEART CATH AND CORONARY ANGIOGRAPHY N/A 09/05/2018   Procedure: LEFT HEART CATH AND CORONARY ANGIOGRAPHY;  Surgeon: Burnell Blanks, MD;  Location: [facility];  Service: Cardiovascular;  Laterality: N/A;  . TONSILLECTOMY  1943     Social History:  Reviewed by me   reports that she quit smoking about 27 years ago. Her smoking use included cigarettes. She has a 80.00 pack-year smoking history. She quit smokeless tobacco use about 26 years ago.  Her smokeless tobacco use included chew. She reports that she does not drink alcohol or use drugs.  Allergies:    Allergies  Allergen Reactions  . Ivp Dye [Iodinated Diagnostic Agents] Anaphylaxis  . Sulfonamide Derivatives Shortness Of Breath  . Lipitor [Atorvastatin Calcium]     Muscle Aches  . Zetia [Ezetimibe]     Muscle Aches  . Penicillins Rash    As a teenager. Has patient had a PCN reaction causing immediate rash, facial/tongue/throat swelling, SOB or lightheadedness with hypotension: No Has patient had a PCN reaction causing severe rash involving mucus membranes or skin necrosis: No Has patient had a PCN reaction that required hospitalization: No Has patient had a PCN reaction occurring within the last  10 years: No If all of the above answers are "NO", then may proceed with Cephalosporin use.     Family History :   Family History  Problem Relation Age of Onset  . Heart failure Mother   . Prostate cancer Father   . Prostate cancer Brother    Family history reviewed, noted as above, not pertinent to current presentation.   Home Medications:    Prior to Admission medications   Medication Sig Start Date End Date Taking? Authorizing Provider  carboxymethylcellulose (REFRESH) 1 % ophthalmic solution Place 1 drop into both eyes 2 (two) times daily as needed (dry eyes).     [provider]  clopidogrel (PLAVIX) 75 MG tablet Take 1 tablet (75 mg total)  by mouth daily. 12/04/18   Strader, Fransisco Hertz, PA-C  docusate sodium (COLACE) 100 MG capsule Take 200 mg by mouth 2 (two) times daily as needed for mild constipation.     [provider]  ELIQUIS 5 MG TABS tablet Take 1 tablet by mouth 2 (two) times daily. 08/12/18   [provider]  fexofenadine (ALLEGRA) 180 MG tablet Take 180 mg by mouth daily.      [provider]  fluticasone (FLONASE) 50 MCG/ACT nasal spray Place 2 sprays into both nostrils daily.     [provider]  losartan (COZAAR) 50 MG tablet  10/07/18   [provider]  metFORMIN (GLUCOPHAGE-XR) 500 MG 24 hr tablet Take 1 tablet (500 mg total) by mouth 2 (two) times daily with a meal. 05/10/18   Johnson, Clanford L, MD  metoprolol succinate (TOPROL-XL) 100 MG 24 hr tablet Take 100 mg by mouth daily. Take with or immediately following a meal.    [provider]  Multiple Vitamin (MULTIVITAMIN) tablet Take 1 tablet by mouth daily.      [provider]  nitroGLYCERIN (NITROSTAT) 0.4 MG SL tablet Place 0.4 mg under the tongue every 5 (five) minutes as needed for chest pain.    de Stanford Scotland, MD  pantoprazole (PROTONIX) 40 MG tablet Take 1 tablet (40 mg total) by mouth daily. 04/02/18   Strader, Fransisco Hertz, PA-C  rosuvastatin (CRESTOR) 20 MG tablet Take 1 tablet (20 mg total) by mouth every evening. 09/06/18   Herminio Commons, MD  ursodiol (ACTIGALL) 300 MG capsule Take 300 mg by mouth 2 (two) times daily.     [provider]    Physical Exam:    Physical Exam: Vitals:   09/29/19 2016 09/29/19 2019  BP:  (!) 173/63  Pulse:  66  Resp:  18  Temp:  97.8 F (36.6 C)  TempSrc:  Oral  SpO2:  95%  Weight: 72.6 kg   Height: 5' 5"  (1.651 m)     Constitutional: NAD, calm, comfortable Vitals:   09/29/19 2016 09/29/19 2019  BP:  (!) 173/63  Pulse:  66  Resp:  18  Temp:  97.8 F (36.6 C)  TempSrc:  Oral  SpO2:  95%  Weight: 72.6 kg   Height: 5' 5"  (1.651 m)     Eyes: PERRL, lids and conjunctivae normal ENMT: Mucous membranes are moist. Posterior pharynx clear of any exudate or lesions.Normal dentition.  Neck: normal, supple, no masses, no thyromegaly Respiratory: clear to auscultation bilaterally, no wheezing, no crackles. Normal respiratory effort. No accessory muscle use.  Cardiovascular: Regular rate and rhythm, no murmurs / rubs / gallops. No extremity edema. 2+ pedal pulses. No carotid bruits.  Abdomen: no tenderness, no masses  palpated. No hepatosplenomegaly. Bowel sounds positive.  Musculoskeletal: no clubbing / cyanosis. No joint deformity upper and lower extremities. Good ROM, no contractures. Normal muscle tone.  Skin: no rashes, lesions, ulcers. No induration Neurologic: CN 2-12 grossly intact. Sensation intact, DTR normal.  Strength appears just slightly decreased on left upper and left lower extremity. Psychiatric: Normal judgment and insight.  Patient is not oriented to time, knows she is in the hospital but cannot state the name.. Normal mood.    Decubitus Ulcers: Not present on admission Catheters and tubes: None  Data Review:    Labs on Admission: I have personally reviewed following labs and imaging studies  CBC: Recent Labs  Lab 09/29/19 2106 09/29/19 2151  WBC 6.9  --   NEUTROABS 4.1  --   HGB 12.8 12.6  HCT 38.7 37.0  MCV 95.1  --   PLT 187  --    Basic Metabolic Panel: Recent Labs  Lab 09/29/19 2106 09/29/19 2151  NA 138 140  K 3.9 3.8  CL 103 102  CO2 25  --   GLUCOSE 218* 208*  BUN 20 20  CREATININE 0.57 0.50  CALCIUM 9.4  --    GFR: Estimated Creatinine Clearance: 47.6 mL/min (by C-G formula based on SCr of 0.5 mg/dL). Liver Function Tests: Recent Labs  Lab 09/29/19 2106  AST 13*  ALT 13  ALKPHOS 94  BILITOT 0.3  PROT 7.3  ALBUMIN 4.3   No results for input(s): LIPASE, AMYLASE in the last 168 hours. No results for input(s): AMMONIA in the last 168 hours. Coagulation Profile: Recent  Labs  Lab 09/29/19 2106  INR 1.1   Cardiac Enzymes: No results for input(s): CKTOTAL, CKMB, CKMBINDEX, TROPONINI in the last 168 hours. BNP (last 3 results) No results for input(s): PROBNP in the last 8760 hours. HbA1C: No results for input(s): HGBA1C in the last 72 hours. CBG: No results for input(s): GLUCAP in the last 168 hours. Lipid Profile: No results for input(s): CHOL, HDL, LDLCALC, TRIG, CHOLHDL, LDLDIRECT in the last 72 hours. Thyroid Function Tests: No results for input(s): TSH, T4TOTAL, FREET4, T3FREE, THYROIDAB in the last 72 hours. Anemia Panel: No results for input(s): VITAMINB12, FOLATE, FERRITIN, TIBC, IRON, RETICCTPCT in the last 72 hours. Urine analysis:    Component Value Date/Time   COLORURINE YELLOW 09/04/2018 0137   APPEARANCEUR HAZY (A) 09/04/2018 0137   LABSPEC 1.010 09/04/2018 0137   PHURINE 8.0 09/04/2018 0137   GLUCOSEU NEGATIVE 09/04/2018 0137   HGBUR NEGATIVE 09/04/2018 [place] NEGATIVE 09/04/2018 0137   KETONESUR NEGATIVE 09/04/2018 0137   PROTEINUR NEGATIVE 09/04/2018 0137   NITRITE NEGATIVE 09/04/2018 0137   LEUKOCYTESUR NEGATIVE 09/04/2018 7253     Imaging Results:      Radiological Exams on Admission: CT HEAD WO CONTRAST  Result Date: 09/29/2019 CLINICAL DATA:  Slurred speech EXAM: CT HEAD WITHOUT CONTRAST TECHNIQUE: Contiguous axial images were obtained from the base of the skull through the vertex without intravenous contrast. COMPARISON:  CT 09/05/2018 FINDINGS: Brain: No acute territorial infarction, hemorrhage, or intracranial mass. Atrophy. Extensive hypodensity in the white matter consistent with chronic small vessel ischemic change. Cerebellar atrophy. Chronic lacunar infarcts within the thalamus and bilateral white matter. Chronic appearing infarct in the left sub insula. Stable ventricle size. Vascular: No hyperdense vessels. Vertebral and carotid vascular calcification Skull: Normal. Negative for fracture or focal  lesion. Sinuses/Orbits: No acute finding. Mucous retention cysts in the maxillary sinuses. Other: None IMPRESSION: 1. No CT evidence for  acute intracranial abnormality. 2. Atrophy and extensive chronic small vessel ischemic change of the white matter. Electronically Signed   By: Donavan Foil M.D.   On: 09/29/2019 20:42      [facility] Ginette Otto MD [facility]  If 7PM-7AM, please contact night-coverage   09/29/2019, 10:15 PM

## 2019-09-29 NOTE — ED Provider Notes (Signed)
Emergency Department Provider Note   I have reviewed the triage vital signs and the nursing notes.   HISTORY  Chief Complaint Weakness (altered mental status)   HPI [name] is a 86 y.o. female with past history of stroke, CAD, HTN, HLD, and DM presents emergency department by EMS with acute onset speech disturbance.  Patient states that she been feeling well for the day when she was being driven back to her house by her daughter.  Patient states that she suddenly began having trouble with her speech.  She was getting sounds out but states that they were nonsense type sounds and incomprehensible.  She felt she knew what she wanted to say but could not get it to come out properly.  This lasted for several minutes and EMS was called.  On their arrival, they report return to normal speech.  Patient feels like her speech is back to normal and she is able to get her words out.  She never had weakness, numbness, difficulty swallowing, or change in vision. No CP or heart palpitations.     Past Medical History:  Diagnosis Date  . Allergic rhinitis   . Allergy to IVP dye   . Arthritis    "probably in my hands" (02/26/2018)  . Colonic polyp   . Coronary artery disease    a. cath 02/2018 PCI with DES x1 to the distal RCA and DES x1 to the proximal LAD. b. NSTEMI 09/2018 s/p DES to dRCA and DES to prox LAD just beyond stented segments, residual moderate OM2 disease for medical therapy.  . CVA (cerebral vascular accident) ([place])   . Dementia ([place])   . GERD (gastroesophageal reflux disease)   . History of blood transfusion    "don't remember why or when" (02/26/2018)  . Hyperlipidemia   . Hypertension   . Ischemic cardiomyopathy    a. EF 40-45% by echo 09/2018.  . Moderate aortic stenosis   . Moderate mitral regurgitation   . Nonalcoholic hepatosteatosis   . PAF (paroxysmal atrial fibrillation) ([place])   . TIA (transient ischemic attack) 2000s   "I've had ~ 3" (02/26/2018)  . Type II  diabetes mellitus [facility])     Patient Active Problem List   Diagnosis Date Noted  . Stroke ([place]) 09/06/2018  . PAF (paroxysmal atrial fibrillation) ([place]) 09/06/2018  . Moderate aortic stenosis 09/06/2018  . Moderate mitral regurgitation 09/06/2018  . Disorientation 09/06/2018  . Ischemic cardiomyopathy   . NSTEMI (non-ST elevated myocardial infarction) ([place])   . Arm pain, anterior, left 09/03/2018  . Angina at rest [facility]) 09/03/2018  . Abnormal EKG   . Elevated troponin   . Hypertensive urgency   . Acute left-sided weakness 05/07/2018  . Nonalcoholic hepatosteatosis 01/77/9390  . Acute CVA (cerebrovascular accident) ([place]) 05/07/2018  . Weakness 05/07/2018  . Unstable angina ([place]) 02/26/2018  . TIA (transient ischemic attack) 02/07/2011  . PFO (patent foramen ovale) 02/07/2011  . Type II diabetes mellitus ([place]) 09/30/2008  . CHANGE IN BOWELS 09/30/2008  . Hyperlipidemia 09/29/2008  . Essential hypertension 09/29/2008  . Coronary atherosclerosis 09/29/2008  . VENTRICULAR HYPERTROPHY, LEFT 09/29/2008  . ALLERGIC RHINITIS, CHRONIC 09/29/2008  . OTHER CHRONIC NONALCOHOLIC LIVER DISEASE 30/02/2329  . LIVER FUNCTION TESTS, ABNORMAL, HX OF 09/29/2008  . COLONIC POLYPS, HX OF 09/29/2008  . GERD (gastroesophageal reflux disease) 09/29/2008    Past Surgical History:  Procedure Laterality Date  . ABDOMINAL HYSTERECTOMY    . APPENDECTOMY  1950s  . CARDIAC CATHETERIZATION  2001   Nonobstructive CAD  . CATARACT EXTRACTION W/ INTRAOCULAR LENS  IMPLANT, BILATERAL Bilateral 2000s  . CORONARY STENT INTERVENTION N/A 02/26/2018   Procedure: CORONARY STENT INTERVENTION;  Surgeon: Martinique, Peter M, MD;  Location: [place] CV LAB;  Service: Cardiovascular;  Laterality: N/A;  rca   . CORONARY STENT INTERVENTION N/A 09/05/2018   Procedure: CORONARY STENT INTERVENTION;  Surgeon: Burnell Blanks, MD;  Location: [place] CV LAB;  Service: Cardiovascular;  Laterality: N/A;  . LEFT HEART  CATH AND CORONARY ANGIOGRAPHY N/A 02/26/2018   Procedure: LEFT HEART CATH AND CORONARY ANGIOGRAPHY;  Surgeon: Martinique, Peter M, MD;  Location: [place] CV LAB;  Service: Cardiovascular;  Laterality: N/A;  . LEFT HEART CATH AND CORONARY ANGIOGRAPHY N/A 09/05/2018   Procedure: LEFT HEART CATH AND CORONARY ANGIOGRAPHY;  Surgeon: Burnell Blanks, MD;  Location: [place] CV LAB;  Service: Cardiovascular;  Laterality: N/A;  . TONSILLECTOMY  1943    Allergies Ivp dye [iodinated diagnostic agents], Sulfonamide derivatives, Lipitor [atorvastatin calcium], Zetia [ezetimibe], and Penicillins  Family History  Problem Relation Age of Onset  . Heart failure Mother   . Prostate cancer Father   . Prostate cancer Brother     Social History Social History   Tobacco Use  . Smoking status: Former Smoker    Packs/day: 2.00    Years: 40.00    Pack years: 80.00    Types: Cigarettes    Quit date: 06/05/1992    Years since quitting: 27.3  . Smokeless tobacco: Former Systems developer    Types: Chew    Quit date: 12/03/1992  . Tobacco comment: used tobacco to help quit smoking  Substance Use Topics  . Alcohol use: Never  . Drug use: Never    Review of Systems  Constitutional: No fever/chills Eyes: No visual changes. ENT: No sore throat. Cardiovascular: Denies chest pain. Respiratory: Denies shortness of breath. Gastrointestinal: No abdominal pain.  No nausea, no vomiting.  No diarrhea.  No constipation. Genitourinary: Negative for dysuria. Musculoskeletal: Negative for back pain. Skin: Negative for rash. Neurological: Negative for headaches, focal weakness or numbness. Positive speech disturbance (resolved).   10-point ROS otherwise negative.  ____________________________________________   PHYSICAL EXAM:  VITAL SIGNS: ED Triage Vitals  Enc Vitals Group     BP 09/29/19 2019 (!) 173/63     Pulse Rate 09/29/19 2019 66     Resp 09/29/19 2019 18     Temp 09/29/19 2019 97.8 F (36.6 C)      Temp Source 09/29/19 2019 Oral     SpO2 09/29/19 2019 95 %     Weight 09/29/19 2016 160 lb (72.6 kg)     Height 09/29/19 2016 5' 5"  (1.651 m)   Constitutional: Alert and oriented. Well appearing and in no acute distress. Eyes: Conjunctivae are normal. PERRL.  Head: Atraumatic. Nose: No congestion/rhinnorhea. Mouth/Throat: Mucous membranes are moist. Neck: No stridor.  Cardiovascular: Normal rate, regular rhythm. Good peripheral circulation. Grossly normal heart sounds.   Respiratory: Normal respiratory effort.  No retractions. Lungs CTAB. Gastrointestinal: Soft and nontender. No distention.  Musculoskeletal: No gross deformities of extremities. Neurologic:  Normal speech and language. No gross focal neurologic deficits are appreciated. No facial asymmetry. No pronator drift. Normal sensation. No facial asymmetry.  Skin:  Skin is warm, dry and intact. No rash noted.  ____________________________________________   LABS (all labs ordered are listed, but only abnormal results are displayed)  Labs Reviewed  COMPREHENSIVE METABOLIC PANEL - Abnormal; Notable for the following  components:      Result Value   Glucose, Bld 218 (*)    AST 13 (*)    All other components within normal limits  HEMOGLOBIN A1C - Abnormal; Notable for the following components:   Hgb A1c MFr Bld 7.6 (*)    All other components within normal limits  BASIC METABOLIC PANEL - Abnormal; Notable for the following components:   Glucose, Bld 164 (*)    All other components within normal limits  I-STAT CHEM 8, ED - Abnormal; Notable for the following components:   Glucose, Bld 208 (*)    All other components within normal limits  RESPIRATORY PANEL BY RT PCR (FLU A&B, COVID)  ETHANOL  PROTIME-INR  APTT  CBC  DIFFERENTIAL  LIPID PANEL  CBC  RAPID URINE DRUG SCREEN, HOSP PERFORMED  URINALYSIS, ROUTINE W REFLEX MICROSCOPIC   ____________________________________________  EKG   EKG  Interpretation  Date/Time:  Monday September 29 2019 20:16:53 EDT Ventricular Rate:  71 PR Interval:    QRS Duration: 108 QT Interval:  377 QTC Calculation: 410 R Axis:   -28 Text Interpretation: Sinus rhythm Inferior infarct, old Lateral leads are also involved No STEMI Confirmed by Nanda Quinton [phone]) on 09/29/2019 8:25:43 PM       ____________________________________________  RADIOLOGY  CT HEAD WO CONTRAST  Result Date: 09/29/2019 CLINICAL DATA:  Slurred speech EXAM: CT HEAD WITHOUT CONTRAST TECHNIQUE: Contiguous axial images were obtained from the base of the skull through the vertex without intravenous contrast. COMPARISON:  CT 09/05/2018 FINDINGS: Brain: No acute territorial infarction, hemorrhage, or intracranial mass. Atrophy. Extensive hypodensity in the white matter consistent with chronic small vessel ischemic change. Cerebellar atrophy. Chronic lacunar infarcts within the thalamus and bilateral white matter. Chronic appearing infarct in the left sub insula. Stable ventricle size. Vascular: No hyperdense vessels. Vertebral and carotid vascular calcification Skull: Normal. Negative for fracture or focal lesion. Sinuses/Orbits: No acute finding. Mucous retention cysts in the maxillary sinuses. Other: None IMPRESSION: 1. No CT evidence for acute intracranial abnormality. 2. Atrophy and extensive chronic small vessel ischemic change of the white matter. Electronically Signed   By: Donavan Foil M.D.   On: 09/29/2019 20:42   MR BRAIN WO CONTRAST  Result Date: 09/30/2019 CLINICAL DATA:  Transient ischemic attack (TIA). Additional history provided: Patient with symptoms of aphasia, dysarthria lasting for several minutes, symptoms resolved on arrival to ED, history of 3 previous TIAs, mild left upper and lower extremity weakness on exam. EXAM: MRI HEAD WITHOUT CONTRAST TECHNIQUE: Multiplanar, multiecho pulse sequences of the brain and surrounding structures were obtained without intravenous  contrast. COMPARISON:  Noncontrast head CT 09/29/2019, head CT 09/05/2018, MRI/MRA head 09/03/2018 FINDINGS: Brain: Mild intermittent motion degradation. Again demonstrated is advanced patchy and confluent T2/FLAIR hyperintensity within the cerebral white matter which is nonspecific, but consistent with chronic small vessel ischemic disease. Redemonstrated chronic lacunar infarcts within the cerebral white matter, basal ganglia, thalami and cerebellum. Chronic small vessel ischemic changes are also a again demonstrated within the pons. This includes a chronic infarct within the right pons. Stable, mild generalized parenchymal atrophy. There is no acute infarct. No evidence of intracranial mass. No chronic intracranial blood products. No extra-axial fluid collection. No midline shift. Vascular: Chronically occluded right posterior cerebral artery better appreciated on prior MRA 09/03/2018. Flow voids otherwise preserved within the proximal large arterial vessels. Skull and upper cervical spine: No focal marrow lesion. Incidentally noted right atlantooccipital joint effusion. Sinuses/Orbits: Visualized orbits show no acute finding. Mild ethmoid  sinus mucosal thickening. Small right maxillary sinus mucous retention cyst. No significant mastoid effusion. IMPRESSION: 1. No evidence of acute intracranial abnormality. Specifically, there is no evidence of acute infarct. 2. Advanced chronic small vessel ischemic disease. Redemonstrated chronic lacunar infarcts within the cerebral white matter, basal ganglia, thalami and cerebellum. Unchanged chronic infarct within the right pons. 3. Stable, mild generalized parenchymal atrophy. 4. Mild ethmoid sinus mucosal thickening. Small right maxillary sinus mucous retention cyst. Electronically Signed   By: Kellie Simmering DO   On: 09/30/2019 07:46    ____________________________________________   PROCEDURES  Procedure(s) performed:    Procedures  None ____________________________________________   INITIAL IMPRESSION / ASSESSMENT AND PLAN / ED COURSE  Pertinent labs & imaging results that were available during my care of the patient were reviewed by me and considered in my medical decision making (see chart for details).   Patient presents emergency department for evaluation of acute onset speech disturbance which is resolved.  Patient had an episode of garbled speech concerning for TIA.  She is neuro intact at this time.  She does have complicated past history which increase her risk for TIA/strokes. ABCD2 score of 5 (moderate risk). Have ordered CT head along with labs and COVID test although patient is without COVID symptoms. Suspect TIA admit.   Discussed patient's case with [facility] to request admission. Patient and family (if present) updated with plan. Care transferred to [facility] service.  I reviewed all nursing notes, vitals, pertinent old records, EKGs, labs, imaging (as available).  ____________________________________________  FINAL CLINICAL IMPRESSION(S) / ED DIAGNOSES  Final diagnoses:  TIA (transient ischemic attack)     MEDICATIONS GIVEN DURING THIS VISIT:  Medications  nitroGLYCERIN (NITROSTAT) SL tablet 0.4 mg (has no administration in time range)  rosuvastatin (CRESTOR) tablet 20 mg (has no administration in time range)  pantoprazole (PROTONIX) EC tablet 40 mg (has no administration in time range)  ursodiol (ACTIGALL) capsule 300 mg (has no administration in time range)  clopidogrel (PLAVIX) tablet 75 mg (has no administration in time range)  apixaban (ELIQUIS) tablet 5 mg (has no administration in time range)  loratadine (CLARITIN) tablet 10 mg (has no administration in time range)   stroke: mapping our early stages of recovery book (has no administration in time range)  sodium chloride flush (NS) 0.9 % injection 3 mL (3 mLs Intravenous Not Given 09/30/19 0630)  sodium chloride flush (NS) 0.9 %  injection 3 mL (has no administration in time range)  0.9 %  sodium chloride infusion (has no administration in time range)  polyethylene glycol (MIRALAX / GLYCOLAX) packet 17 g (has no administration in time range)  bisacodyl (DULCOLAX) suppository 10 mg (has no administration in time range)  ondansetron (ZOFRAN) tablet 4 mg (has no administration in time range)    Or  ondansetron (ZOFRAN) injection 4 mg (has no administration in time range)    Note:  This document was prepared using Dragon voice recognition software and may include unintentional dictation errors.  Nanda Quinton, MD, [facility] Emergency Medicine    Breck Hollinger, [facility], MD 09/30/19 [phone]

## 2019-09-30 ENCOUNTER — Observation Stay (HOSPITAL_BASED_OUTPATIENT_CLINIC_OR_DEPARTMENT_OTHER): Payer: Medicare Other

## 2019-09-30 ENCOUNTER — Observation Stay (HOSPITAL_COMMUNITY): Payer: Medicare Other

## 2019-09-30 DIAGNOSIS — I69322 Dysarthria following cerebral infarction: Secondary | ICD-10-CM | POA: Diagnosis not present

## 2019-09-30 DIAGNOSIS — I340 Nonrheumatic mitral (valve) insufficiency: Secondary | ICD-10-CM | POA: Diagnosis not present

## 2019-09-30 DIAGNOSIS — G459 Transient cerebral ischemic attack, unspecified: Secondary | ICD-10-CM | POA: Diagnosis not present

## 2019-09-30 DIAGNOSIS — I350 Nonrheumatic aortic (valve) stenosis: Secondary | ICD-10-CM | POA: Diagnosis not present

## 2019-09-30 LAB — BASIC METABOLIC PANEL
Anion gap: 12 (ref 5–15)
BUN: 15 mg/dL (ref 8–23)
CO2: 27 mmol/L (ref 22–32)
Calcium: 9.7 mg/dL (ref 8.9–10.3)
Chloride: 102 mmol/L (ref 98–111)
Creatinine, Ser: 0.49 mg/dL (ref 0.44–1.00)
GFR calc Af Amer: >60 mL/min (ref >60)
GFR calc non Af Amer: >60 mL/min (ref >60)
Glucose, Bld: 164 mg/dL — ABNORMAL HIGH (ref 70–99)
Potassium: 4.1 mmol/L (ref 3.5–5.1)
Sodium: 141 mmol/L (ref 135–145)

## 2019-09-30 LAB — CBC
HCT: 42.1 % (ref 36.0–46.0)
Hemoglobin: 13.8 g/dL (ref 12.0–15.0)
MCH: 30.8 pg (ref 26.0–34.0)
MCHC: 32.8 g/dL (ref 30.0–36.0)
MCV: 94 fL (ref 80.0–100.0)
Platelets: 190 10*3/uL (ref 150–400)
RBC: 4.48 MIL/uL (ref 3.87–5.11)
RDW: 11.9 % (ref 11.5–15.5)
WBC: 7.4 10*3/uL (ref 4.0–10.5)
nRBC: 0 % (ref 0.0–0.2)

## 2019-09-30 LAB — ECHOCARDIOGRAM COMPLETE
Height: 65 in
Weight: 2560 oz

## 2019-09-30 LAB — HEMOGLOBIN A1C
Hgb A1c MFr Bld: 7.6 % — ABNORMAL HIGH (ref 4.8–5.6)
Mean Plasma Glucose: 171.42 mg/dL

## 2019-09-30 LAB — LIPID PANEL
Cholesterol: 116 mg/dL (ref 0–200)
HDL: 45 mg/dL (ref >40)
LDL Cholesterol: 50 mg/dL (ref 0–99)
Total CHOL/HDL Ratio: 2.6 RATIO
Triglycerides: 107 mg/dL (ref <150)
VLDL: 21 mg/dL (ref 0–40)

## 2019-09-30 MED ORDER — ONDANSETRON HCL 4 MG PO TABS: 4.0000 mg | ORAL_TABLET | Freq: Four times a day (QID) | ORAL | Status: DC | PRN

## 2019-09-30 MED ORDER — LORATADINE 10 MG PO TABS
10.0000 mg | ORAL_TABLET | Freq: Every day | ORAL | Status: DC
  Administered 2019-09-30: 10 mg via ORAL
  Filled 2019-09-30 (×2): qty 1

## 2019-09-30 MED ORDER — SODIUM CHLORIDE 0.9 % IV SOLN: 250.0000 mL | INTRAVENOUS | Status: DC | PRN

## 2019-09-30 MED ORDER — ROSUVASTATIN CALCIUM 20 MG PO TABS
20.0000 mg | ORAL_TABLET | Freq: Every evening | ORAL | Status: DC
  Administered 2019-09-30: 17:00:00 20 mg via ORAL
  Filled 2019-09-30 (×2): qty 1

## 2019-09-30 MED ORDER — ENOXAPARIN SODIUM 40 MG/0.4ML ~~LOC~~ SOLN: 40.0000 mg | SUBCUTANEOUS | Status: DC

## 2019-09-30 MED ORDER — CLOPIDOGREL BISULFATE 75 MG PO TABS
75.0000 mg | ORAL_TABLET | Freq: Every morning | ORAL | Status: DC
  Administered 2019-09-30: 75 mg via ORAL
  Filled 2019-09-30 (×2): qty 1

## 2019-09-30 MED ORDER — NITROGLYCERIN 0.4 MG SL SUBL: 0.4000 mg | SUBLINGUAL_TABLET | SUBLINGUAL | Status: DC | PRN

## 2019-09-30 MED ORDER — PANTOPRAZOLE SODIUM 40 MG PO TBEC
40.0000 mg | DELAYED_RELEASE_TABLET | Freq: Every day | ORAL | Status: DC
  Administered 2019-09-30: 40 mg via ORAL
  Filled 2019-09-30 (×2): qty 1

## 2019-09-30 MED ORDER — SODIUM CHLORIDE 0.9% FLUSH
3.0000 mL | Freq: Two times a day (BID) | INTRAVENOUS | Status: DC
  Administered 2019-09-30: 3 mL via INTRAVENOUS

## 2019-09-30 MED ORDER — APIXABAN 5 MG PO TABS
5.0000 mg | ORAL_TABLET | Freq: Two times a day (BID) | ORAL | Status: DC
  Filled 2019-09-30: qty 1

## 2019-09-30 MED ORDER — BISACODYL 10 MG RE SUPP: 10.0000 mg | Freq: Every day | RECTAL | Status: DC | PRN

## 2019-09-30 MED ORDER — ONDANSETRON HCL 4 MG/2ML IJ SOLN: 4.0000 mg | Freq: Four times a day (QID) | INTRAMUSCULAR | Status: DC | PRN

## 2019-09-30 MED ORDER — POLYETHYLENE GLYCOL 3350 17 G PO PACK: 17.0000 g | PACK | Freq: Every day | ORAL | Status: DC | PRN

## 2019-09-30 MED ORDER — SODIUM CHLORIDE 0.9% FLUSH: 3.0000 mL | INTRAVENOUS | Status: DC | PRN

## 2019-09-30 MED ORDER — STROKE: EARLY STAGES OF RECOVERY BOOK
Freq: Once | Status: DC
  Filled 2019-09-30: qty 1

## 2019-09-30 MED ORDER — URSODIOL 300 MG PO CAPS
300.0000 mg | ORAL_CAPSULE | Freq: Two times a day (BID) | ORAL | Status: DC
  Filled 2019-09-30 (×4): qty 1

## 2019-09-30 NOTE — Evaluation (Signed)
Clinical/Bedside Swallow Evaluation Patient Details  Name: [name] MRN: [id number] Date of Birth: December 23, 1927  Today's Date: 09/30/2019 Time: SLP Start Time (ACUTE ONLY): 3491 SLP Stop Time (ACUTE ONLY): 1616 SLP Time Calculation (min) (ACUTE ONLY): 25 min  Past Medical History:  Past Medical History:  Diagnosis Date  . Allergic rhinitis   . Allergy to IVP dye   . Arthritis    "probably in my hands" (02/26/2018)  . Colonic polyp   . Coronary artery disease    a. cath 02/2018 PCI with DES x1 to the distal RCA and DES x1 to the proximal LAD. b. NSTEMI 09/2018 s/p DES to dRCA and DES to prox LAD just beyond stented segments, residual moderate OM2 disease for medical therapy.  . CVA (cerebral vascular accident) ([place])   . Dementia ([place])   . GERD (gastroesophageal reflux disease)   . History of blood transfusion    "don't remember why or when" (02/26/2018)  . Hyperlipidemia   . Hypertension   . Ischemic cardiomyopathy    a. EF 40-45% by echo 09/2018.  . Moderate aortic stenosis   . Moderate mitral regurgitation   . Nonalcoholic hepatosteatosis   . PAF (paroxysmal atrial fibrillation) ([place])   . TIA (transient ischemic attack) 2000s   "I've had ~ 3" (02/26/2018)  . Type II diabetes mellitus ([place])    Past Surgical History:  Past Surgical History:  Procedure Laterality Date  . ABDOMINAL HYSTERECTOMY    . APPENDECTOMY  1950s  . CARDIAC CATHETERIZATION  2001   Nonobstructive CAD  . CATARACT EXTRACTION W/ INTRAOCULAR LENS  IMPLANT, BILATERAL Bilateral 2000s  . CORONARY STENT INTERVENTION N/A 02/26/2018   Procedure: CORONARY STENT INTERVENTION;  Surgeon: Martinique, Peter M, MD;  Location: [place] CV LAB;  Service: Cardiovascular;  Laterality: N/A;  rca   . CORONARY STENT INTERVENTION N/A 09/05/2018   Procedure: CORONARY STENT INTERVENTION;  Surgeon: Burnell Blanks, MD;  Location: [place] CV LAB;  Service: Cardiovascular;  Laterality: N/A;  . LEFT HEART CATH AND CORONARY  ANGIOGRAPHY N/A 02/26/2018   Procedure: LEFT HEART CATH AND CORONARY ANGIOGRAPHY;  Surgeon: Martinique, Peter M, MD;  Location: [place] CV LAB;  Service: Cardiovascular;  Laterality: N/A;  . LEFT HEART CATH AND CORONARY ANGIOGRAPHY N/A 09/05/2018   Procedure: LEFT HEART CATH AND CORONARY ANGIOGRAPHY;  Surgeon: Burnell Blanks, MD;  Location: [place] CV LAB;  Service: Cardiovascular;  Laterality: N/A;  . TONSILLECTOMY  1943   HPI:  [name] is a 86 y.o. female with past history of stroke, CAD, HTN, HLD, and DM presents emergency department by EMS with acute onset speech disturbance.  Patient states that she been feeling well for the day when she was being driven back to her house by her daughter.  Patient states that she suddenly began having trouble with her speech.  She was getting sounds out but states that they were nonsense type sounds and incomprehensible.  She felt she knew what she wanted to say but could not get it to come out properly.  This lasted for several minutes and EMS was called.  On their arrival, they report return to normal speech.  Patient feels like her speech is back to normal and she is able to get her words out.  She never had weakness, numbness, difficulty swallowing, or change in vision. No CP or heart palpitations. Pt had an MBSS April 2020 where D2/HTL was recommended. Pt reports consuming thin liquids and regular textures at  home however she seems to be an unreliable historian.    Assessment / Plan / Recommendation Clinical Impression  Clinical swallowing evaluation completed while Pt was sitting upright in chair; Pt consumed thin liquids, puree textures and solid textures without overt s/sx of aspiration. Pt has a hx of silent aspiration on MBSS completed 09/2018 however she has reportedly been tolerating a regular/thin diet at home. Pt completed the 3 oz water challenge with a straw without difficulty and reports she has "better control" when drinking with a  straw. Recommend initiate D3/mech soft diet and thin liquids; recommend meds to be adminsitered whole with applesauce. ST will f/u X1 to ensure diet tolerance. Thank you, SLP Visit Diagnosis: Dysphagia, unspecified (R13.10)    Aspiration Risk  Mild aspiration risk    Diet Recommendation Dysphagia 3 (Mech soft);Thin liquid   Liquid Administration via: Straw Medication Administration: Whole meds with puree Supervision: Patient able to self feed;Intermittent supervision to cue for compensatory strategies Compensations: Minimize environmental distractions;Slow rate;Small sips/bites;Follow solids with liquid;Multiple dry swallows after each bite/sip Postural Changes: Remain upright for at least 30 minutes after po intake;Seated upright at 90 degrees    Other  Recommendations Oral Care Recommendations: Oral care BID   Follow up Recommendations        Frequency and Duration min 1 x/week  1 week       Prognosis Prognosis for Safe Diet Advancement: Good Barriers to Reach Goals: Cognitive deficits      Swallow Study   General Date of Onset: 09/29/19 HPI: [name] is a 86 y.o. female with past history of stroke, CAD, HTN, HLD, and DM presents emergency department by EMS with acute onset speech disturbance.  Patient states that she been feeling well for the day when she was being driven back to her house by her daughter.  Patient states that she suddenly began having trouble with her speech.  She was getting sounds out but states that they were nonsense type sounds and incomprehensible.  She felt she knew what she wanted to say but could not get it to come out properly.  This lasted for several minutes and EMS was called.  On their arrival, they report return to normal speech.  Patient feels like her speech is back to normal and she is able to get her words out.  She never had weakness, numbness, difficulty swallowing, or change in vision. No CP or heart palpitations. Pt had an MBSS April  2020 where D2/HTL was recommended. Pt reports consuming thin liquids and regular textures at home however she seems to be an unreliable historian.  Type of Study: Bedside Swallow Evaluation Previous Swallow Assessment: MBSS 09/2018 - D2/HTL Diet Prior to this Study: NPO Temperature Spikes Noted: No Respiratory Status: Room air History of Recent Intubation: No Behavior/Cognition: Alert;Cooperative;Pleasant mood Oral Cavity Assessment: Within Functional Limits Oral Care Completed by SLP: Recent completion by staff Oral Cavity - Dentition: Dentures, top;Dentures, bottom Vision: Functional for self-feeding Self-Feeding Abilities: Able to feed self Patient Positioning: Upright in chair Baseline Vocal Quality: Normal Volitional Cough: Strong Volitional Swallow: Able to elicit    Oral/Motor/Sensory Function Overall Oral Motor/Sensory Function: Within functional limits   Ice Chips Ice chips: Within functional limits   Thin Liquid Thin Liquid: Within functional limits    Nectar Thick Nectar Thick Liquid: Not tested   Honey Thick Honey Thick Liquid: Not tested   Puree Puree: Within functional limits   Solid     Solid: Within functional limits  [name], CCC-SLP Speech Language Pathologist  Wende Bushy 09/30/2019,4:21 PM

## 2019-09-30 NOTE — Progress Notes (Signed)
*  PRELIMINARY RESULTS* Echocardiogram 2D Echocardiogram has been performed.  [name] 09/30/2019, 3:15 PM

## 2019-09-30 NOTE — Progress Notes (Signed)
Has shown no deficits with speech or otherwise.  Failed swallow screen. Stated has weakness in left leg which has been there for a while and walks with walker at home.  Daughter said that patient is living with her while patient's husband is in the hospital and will go back home when husband is out of hospitalon Thursday. Daughter stated that someone is with patient and husband in their home , either family or a private sitter to help with their care.

## 2019-09-30 NOTE — Progress Notes (Signed)
Iv removed, discharge instructions reviewed by Donavan Foil.  Daughter to give ride home

## 2019-09-30 NOTE — Discharge Summary (Signed)
Physician Discharge Summary  [name] LMB:[id number] DOB: 02-Feb-1928 DOA: 09/29/2019  PCP: Leeanne Rio, MD  Admit date: 09/29/2019  Discharge date: 09/30/2019  Admitted From:Home  Disposition:  Home  Recommendations for Outpatient Follow-up:  1. Follow up with PCP in 1-2 weeks 2. Continue home medications as prior  Home Health: Yes with PT and OT  Equipment/Devices: None  Discharge Condition: Stable  CODE STATUS: Full  Diet recommendation: Dysphagia 3  Brief/Interim Summary: Per HPI: [name] is a 86 y.o. female with medical history significant of hypertension, hyperlipidemia, diabetes mellitus type 2, coronary artery disease, CHF, GERD, atrial fibrillation on anticoagulation who presented to the ER with speech abnormalities.  Patient has a history of dementia and is mildly confused at baseline.  History is mainly been obtained from the ER record as the patient is unable to provide a detailed history.  Patient was apparently being driven back to her house by her daughter when she started having trouble with speech.  She had word finding difficulty, had some garbled speech and was making some nonsense type sounds which are incomprehensible.  Patient states she felt like she knew what she wanted to say but was not able to get her words out properly.  This lasted for several minutes and resolved by itself.  On arrival to the ER her speech was back to normal.  At the time of my evaluation, the patient has normal speech but has mild confusion. No nausea, vomiting, abdominal pain, diarrhea, dysuria, chest pain, shortness of breath, palpitations, blurring of vision, seizures, syncope.  Patient was admitted with symptoms of aphasia and dysarthria with possible TIA versus CVA.  Brain MRI was performed with no acute findings of CVA and 2D echocardiogram with LVEF 55 to 60% with grade 1 diastolic dysfunction no PFO noted.  Her LDL is 50 and A1c is 7.6%.  She has done quite well and  did have some issues with swallow for which SLP was consulted.  They are currently recommending dysphagia 3 diet and PT has evaluated patient with recommendations with home health PT.  She is stable for discharge at this time with no other recommendations noted.  She does stay with her daughter at home and has plenty of care and support.  She is to continue her usual home medications.  Discharge Diagnoses:  Principal Problem:   TIA (transient ischemic attack) Active Problems:   Type II diabetes mellitus ([place])   Hyperlipidemia   Essential hypertension   Coronary atherosclerosis   GERD (gastroesophageal reflux disease)  Principal discharge diagnosis: Dysarthria likely secondary to TIA.  Discharge Instructions  Discharge Instructions    Diet - low sodium heart healthy   Complete by: As directed    Increase activity slowly   Complete by: As directed      Allergies as of 09/30/2019      Reactions   Ivp Dye [iodinated Diagnostic Agents] Anaphylaxis   Sulfonamide Derivatives Shortness Of Breath   Lipitor [atorvastatin Calcium]    Muscle Aches   Zetia [ezetimibe]    Muscle Aches   Penicillins Rash   As a teenager.      Medication List    TAKE these medications   amLODipine 10 MG tablet Commonly known as: NORVASC Take 10 mg by mouth every evening.   clopidogrel 75 MG tablet Commonly known as: Plavix Take 1 tablet (75 mg total) by mouth daily. What changed: when to take this   Eliquis 5 MG Tabs tablet Generic drug:  apixaban Take 5 mg by mouth 2 (two) times daily.   fexofenadine 180 MG tablet Commonly known as: ALLEGRA Take 180 mg by mouth daily.   losartan 100 MG tablet Commonly known as: COZAAR Take 100 mg by mouth in the morning.   metFORMIN 500 MG 24 hr tablet Commonly known as: GLUCOPHAGE-XR Take 1 tablet (500 mg total) by mouth 2 (two) times daily with a meal.   metoprolol succinate 100 MG 24 hr tablet Commonly known as: TOPROL-XL Take 100 mg by mouth in the  morning and at bedtime. Take with or immediately following a meal.   nitroGLYCERIN 0.4 MG SL tablet Commonly known as: NITROSTAT Place 0.4 mg under the tongue every 5 (five) minutes as needed for chest pain.   pantoprazole 40 MG tablet Commonly known as: PROTONIX Take 1 tablet (40 mg total) by mouth daily.   REFRESH 1 % ophthalmic solution Generic drug: carboxymethylcellulose Place 1 drop into both eyes 2 (two) times daily as needed (dry eyes).   rosuvastatin 20 MG tablet Commonly known as: CRESTOR Take 1 tablet (20 mg total) by mouth every evening.   ursodiol 300 MG capsule Commonly known as: ACTIGALL Take 300 mg by mouth 2 (two) times daily.      Follow-up Information    [facility], [facility] Follow up.   Specialty: [place] Why:  PT Contact information: [place] [address] [phone]        Leeanne Rio, MD Follow up in 1 week(s).   Specialty: Family Medicine Contact information: [place] [address] [phone]          Allergies  Allergen Reactions  . Ivp Dye [Iodinated Diagnostic Agents] Anaphylaxis  . Sulfonamide Derivatives Shortness Of Breath  . Lipitor [Atorvastatin Calcium]     Muscle Aches  . Zetia [Ezetimibe]     Muscle Aches  . Penicillins Rash    As a teenager.      Consultations:  None   Procedures/Studies: CT HEAD WO CONTRAST  Result Date: 09/29/2019 CLINICAL DATA:  Slurred speech EXAM: CT HEAD WITHOUT CONTRAST TECHNIQUE: Contiguous axial images were obtained from the base of the skull through the vertex without intravenous contrast. COMPARISON:  CT 09/05/2018 FINDINGS: Brain: No acute territorial infarction, hemorrhage, or intracranial mass. Atrophy. Extensive hypodensity in the white matter consistent with chronic small vessel ischemic change. Cerebellar atrophy. Chronic lacunar infarcts within the thalamus and bilateral white matter. Chronic appearing infarct in the left sub  insula. Stable ventricle size. Vascular: No hyperdense vessels. Vertebral and carotid vascular calcification Skull: Normal. Negative for fracture or focal lesion. Sinuses/Orbits: No acute finding. Mucous retention cysts in the maxillary sinuses. Other: None IMPRESSION: 1. No CT evidence for acute intracranial abnormality. 2. Atrophy and extensive chronic small vessel ischemic change of the white matter. Electronically Signed   By: Donavan Foil M.D.   On: 09/29/2019 20:42   MR BRAIN WO CONTRAST  Result Date: 09/30/2019 CLINICAL DATA:  Transient ischemic attack (TIA). Additional history provided: Patient with symptoms of aphasia, dysarthria lasting for several minutes, symptoms resolved on arrival to ED, history of 3 previous TIAs, mild left upper and lower extremity weakness on exam. EXAM: MRI HEAD WITHOUT CONTRAST TECHNIQUE: Multiplanar, multiecho pulse sequences of the brain and surrounding structures were obtained without intravenous contrast. COMPARISON:  Noncontrast head CT 09/29/2019, head CT 09/05/2018, MRI/MRA head 09/03/2018 FINDINGS: Brain: Mild intermittent motion degradation. Again demonstrated is advanced patchy and confluent T2/FLAIR  hyperintensity within the cerebral white matter which is nonspecific, but consistent with chronic small vessel ischemic disease. Redemonstrated chronic lacunar infarcts within the cerebral white matter, basal ganglia, thalami and cerebellum. Chronic small vessel ischemic changes are also a again demonstrated within the pons. This includes a chronic infarct within the right pons. Stable, mild generalized parenchymal atrophy. There is no acute infarct. No evidence of intracranial mass. No chronic intracranial blood products. No extra-axial fluid collection. No midline shift. Vascular: Chronically occluded right posterior cerebral artery better appreciated on prior MRA 09/03/2018. Flow voids otherwise preserved within the proximal large arterial vessels. Skull and upper  cervical spine: No focal marrow lesion. Incidentally noted right atlantooccipital joint effusion. Sinuses/Orbits: Visualized orbits show no acute finding. Mild ethmoid sinus mucosal thickening. Small right maxillary sinus mucous retention cyst. No significant mastoid effusion. IMPRESSION: 1. No evidence of acute intracranial abnormality. Specifically, there is no evidence of acute infarct. 2. Advanced chronic small vessel ischemic disease. Redemonstrated chronic lacunar infarcts within the cerebral white matter, basal ganglia, thalami and cerebellum. Unchanged chronic infarct within the right pons. 3. Stable, mild generalized parenchymal atrophy. 4. Mild ethmoid sinus mucosal thickening. Small right maxillary sinus mucous retention cyst. Electronically Signed   By: Kellie Simmering DO   On: 09/30/2019 07:46   US Carotid Bilateral (at [facility] and [facility] only)  Result Date: 09/30/2019 CLINICAL DATA:  86 year old female with a history of TIA EXAM: BILATERAL CAROTID DUPLEX ULTRASOUND TECHNIQUE: Pearline Cables scale imaging, color Doppler and duplex ultrasound were performed of bilateral carotid and vertebral arteries in the neck. COMPARISON:  None. FINDINGS: Criteria: Quantification of carotid stenosis is based on velocity parameters that correlate the residual internal carotid diameter with NASCET-based stenosis levels, using the diameter of the distal internal carotid lumen as the denominator for stenosis measurement. The following velocity measurements were obtained: RIGHT ICA:  Systolic 96 cm/sec, Diastolic 32 cm/sec CCA:  60 cm/sec SYSTOLIC ICA/CCA RATIO:  1.6 ECA:  91 cm/sec LEFT ICA:  Systolic 161 cm/sec, Diastolic 18 cm/sec CCA:  73 cm/sec SYSTOLIC ICA/CCA RATIO:  1.6 ECA:  105 cm/sec Right Brachial SBP: Not acquired Left Brachial SBP: Not acquired RIGHT CAROTID ARTERY: No significant calcifications of the right common carotid artery. Intermediate waveform maintained. Heterogeneous and partially calcified plaque at the right  carotid bifurcation. No significant lumen shadowing. Low resistance waveform of the right ICA. No significant tortuosity. RIGHT VERTEBRAL ARTERY: Antegrade flow with low resistance waveform. LEFT CAROTID ARTERY: No significant calcifications of the left common carotid artery. Intermediate waveform maintained. Heterogeneous and partially calcified plaque at the left carotid bifurcation without significant lumen shadowing. Low resistance waveform of the left ICA. No significant tortuosity. LEFT VERTEBRAL ARTERY:  Antegrade flow with low resistance waveform. IMPRESSION: Color duplex indicates minimal heterogeneous and calcified plaque, with no hemodynamically significant stenosis by duplex criteria in the extracranial cerebrovascular circulation. Signed, Dulcy Fanny. Dellia Nims, RPVI [facility] Electronically Signed   By: Corrie Mckusick D.O.   On: 09/30/2019 11:36   ECHOCARDIOGRAM COMPLETE  Result Date: 09/30/2019    ECHOCARDIOGRAM REPORT   Patient Name:   [name] Date of Exam: 09/30/2019 Medical Rec #:  [id number]     Height:       65.0 in Accession #:    [id number]    Weight:       160.0 lb Date of Birth:  [birth year]     BSA:          1.799 m Patient Age:  86 years      BP:           136/92 mmHg Patient Gender: F             HR:           86 bpm. Exam Location:  [facility] Procedure: 2D Echo Indications:    TIA 435.9 / G45.9  History:        Patient has prior history of Echocardiogram examinations, most                 recent 09/04/2018. TIA and Stroke; Risk Factors:Diabetes,                 Dyslipidemia and Hypertension. VENTRICULAR HYPERTROPHY, PFO                 (patent foramen ovale.  Sonographer:    Leavy Cella RDCS (AE) Referring Phys: [place]  1. Left ventricular ejection fraction, by estimation, is 55 to 60%. The left ventricle has normal function. The left ventricle has no regional wall motion abnormalities.  There is mild left ventricular hypertrophy. Left ventricular diastolic parameters are consistent with Grade I diastolic dysfunction (impaired relaxation).  2. Right ventricular systolic function is normal. The right ventricular size is normal. Tricuspid regurgitation signal is inadequate for assessing PA pressure.  3. Left atrial size was mildly dilated.  4. The mitral valve is grossly normal. Trivial mitral valve regurgitation.  5. The aortic valve is tricuspid. Moderate aortic valve stenosis. Aortic valve area, by VTI measures 1.17 cm. Aortic valve mean gradient measures 11.3 mmHg. Aortic valve Vmax measures 2.24 m/s. Dimentionless index 0.46.  6. The inferior vena cava is normal in size with greater than 50% respiratory variability, suggesting right atrial pressure of 3 mmHg.  7. No obvious PFO visualized. FINDINGS  Left Ventricle: Left ventricular ejection fraction, by estimation, is 55 to 60%. The left ventricle has normal function. The left ventricle has no regional wall motion abnormalities. The left ventricular internal cavity size was normal in size. There is  mild left ventricular hypertrophy. Left ventricular diastolic parameters are consistent with Grade I diastolic dysfunction (impaired relaxation). Right Ventricle: The right ventricular size is normal. No increase in right ventricular wall thickness. Right ventricular systolic function is normal. Tricuspid regurgitation signal is inadequate for assessing PA pressure. Left Atrium: Left atrial size was mildly dilated. Right Atrium: Right atrial size was normal in size. Pericardium: There is no evidence of pericardial effusion. Mitral Valve: The mitral valve is grossly normal. Mild mitral annular calcification. Trivial mitral valve regurgitation. Tricuspid Valve: The tricuspid valve is grossly normal. Tricuspid valve regurgitation is mild. Aortic Valve: The aortic valve is tricuspid. Aortic valve regurgitation is not visualized. Moderate aortic stenosis  is present. Moderate aortic valve annular calcification. Aortic valve mean gradient measures 11.3 mmHg. Aortic valve peak gradient measures 20.1 mmHg. Aortic valve area, by VTI measures 1.17 cm. Pulmonic Valve: The pulmonic valve was grossly normal. Pulmonic valve regurgitation is trivial. Aorta: The aortic root is normal in size and structure. Venous: The inferior vena cava is normal in size with greater than 50% respiratory variability, suggesting right atrial pressure of 3 mmHg. IAS/Shunts: No atrial level shunt detected by color flow Doppler.  LEFT VENTRICLE PLAX 2D LVIDd:         4.29 cm  Diastology LVIDs:         3.03 cm  LV e' lateral:   5.66 cm/s LV PW:  1.20 cm  LV E/e' lateral: 9.2 LV IVS:        1.27 cm  LV e' medial:    4.46 cm/s LVOT diam:     1.80 cm  LV E/e' medial:  11.7 LV SV:         48 LV SV Index:   27 LVOT Area:     2.54 cm  RIGHT VENTRICLE RV S prime:     9.90 cm/s TAPSE (M-mode): 1.3 cm LEFT ATRIUM             Index LA diam:        3.40 cm 1.89 cm/m LA Vol (A2C):   37.7 ml 20.95 ml/m LA Vol (A4C):   60.2 ml 33.46 ml/m LA Biplane Vol: 48.5 ml 26.96 ml/m  AORTIC VALVE AV Area (Vmax):    1.16 cm AV Area (Vmean):   0.99 cm AV Area (VTI):     1.17 cm AV Vmax:           224.33 cm/s AV Vmean:          156.667 cm/s AV VTI:            0.412 m AV Peak Grad:      20.1 mmHg AV Mean Grad:      11.3 mmHg LVOT Vmax:         102.10 cm/s LVOT Vmean:        60.833 cm/s LVOT VTI:          0.190 m LVOT/AV VTI ratio: 0.46  AORTA Ao Root diam: 2.30 cm MITRAL VALVE MV Area (PHT): 2.79 cm     SHUNTS MV Decel Time: 272 msec     Systemic VTI:  0.19 m MV E velocity: 52.00 cm/s   Systemic Diam: 1.80 cm MV A velocity: 100.00 cm/s MV E/A ratio:  0.52 Rozann Lesches MD Electronically signed by Rozann Lesches MD Signature Date/Time: 09/30/2019/4:10:23 PM    Final      Discharge Exam: Vitals:   09/30/19 1241 09/30/19 1630  BP: (!) 136/92 (!) 165/78  Pulse: 86 81  Resp: 16 20  Temp: 97.8 F (36.6  C)   SpO2: 93% 97%   Vitals:   09/30/19 0227 09/30/19 0830 09/30/19 1241 09/30/19 1630  BP:  (!) 142/74 (!) 136/92 (!) 165/78  Pulse:  98 86 81  Resp:  18 16 20   Temp:  98.4 F (36.9 C) 97.8 F (36.6 C)   TempSrc:  Oral Oral   SpO2: 91% 96% 93% 97%  Weight:      Height:        General: Pt is alert, awake, not in acute distress Cardiovascular: RRR, S1/S2 +, no rubs, no gallops Respiratory: CTA bilaterally, no wheezing, no rhonchi Abdominal: Soft, NT, ND, bowel sounds + Extremities: no edema, no cyanosis    The results of significant diagnostics from this hospitalization (including imaging, microbiology, ancillary and laboratory) are listed below for reference.     Microbiology: Recent Results (from the past 240 hour(s))  Respiratory Panel by RT PCR (Flu A&B, Covid) - Nasopharyngeal Swab     Status: None   Collection Time: 09/29/19  8:23 PM   Specimen: Nasopharyngeal Swab  Result Value Ref Range Status   SARS Coronavirus 2 by RT PCR NEGATIVE NEGATIVE Final    Comment: (NOTE) SARS-CoV-2 target nucleic acids are NOT DETECTED. The SARS-CoV-2 RNA is generally detectable in upper respiratoy specimens during the acute phase of infection. The lowest concentration of SARS-CoV-2 viral  copies this assay can detect is 131 copies/mL. A negative result does not preclude SARS-Cov-2 infection and should not be used as the sole basis for treatment or other patient management decisions. A negative result may occur with  improper specimen collection/handling, submission of specimen other than nasopharyngeal swab, presence of viral mutation(s) within the areas targeted by this assay, and inadequate number of viral copies (<131 copies/mL). A negative result must be combined with clinical observations, patient history, and epidemiological information. The expected result is Negative. Fact Sheet for Patients:  PinkCheek.be Fact Sheet for Healthcare Providers:   GravelBags.it This test is not yet ap proved or cleared by the [place] FDA and  has been authorized for detection and/or diagnosis of SARS-CoV-2 by FDA under an Emergency Use Authorization (EUA). This EUA will remain  in effect (meaning this test can be used) for the duration of the COVID-19 declaration under Section 564(b)(1) of the Act, 21 U.S.C. section 360bbb-3(b)(1), unless the authorization is terminated or revoked sooner.    Influenza A by PCR NEGATIVE NEGATIVE Final   Influenza B by PCR NEGATIVE NEGATIVE Final    Comment: (NOTE) The Xpert Xpress SARS-CoV-2/FLU/RSV assay is intended as an aid in  the diagnosis of influenza from Nasopharyngeal swab specimens and  should not be used as a sole basis for treatment. Nasal washings and  aspirates are unacceptable for Xpert Xpress SARS-CoV-2/FLU/RSV  testing. Fact Sheet for Patients: PinkCheek.be Fact Sheet for Healthcare Providers: GravelBags.it This test is not yet approved or cleared by the [place] FDA and  has been authorized for detection and/or diagnosis of SARS-CoV-2 by  FDA under an Emergency Use Authorization (EUA). This EUA will remain  in effect (meaning this test can be used) for the duration of the  Covid-19 declaration under Section 564(b)(1) of the Act, 21  U.S.C. section 360bbb-3(b)(1), unless the authorization is  terminated or revoked. Performed at [facility], [address], [place], [place] [address]      Labs: BNP (last 3 results) No results for input(s): BNP in the last 8760 hours. Basic Metabolic Panel: Recent Labs  Lab 09/29/19 2106 09/29/19 2151 09/30/19 0404  NA 138 140 141  K 3.9 3.8 4.1  CL 103 102 102  CO2 25  --  27  GLUCOSE 218* 208* 164*  BUN 20 20 15   CREATININE 0.57 0.50 0.49  CALCIUM 9.4  --  9.7   Liver Function Tests: Recent Labs  Lab 09/29/19 2106  AST 13*  ALT 13  ALKPHOS 94   BILITOT 0.3  PROT 7.3  ALBUMIN 4.3   No results for input(s): LIPASE, AMYLASE in the last 168 hours. No results for input(s): AMMONIA in the last 168 hours. CBC: Recent Labs  Lab 09/29/19 2106 09/29/19 2151 09/30/19 0404  WBC 6.9  --  7.4  NEUTROABS 4.1  --   --   HGB 12.8 12.6 13.8  HCT 38.7 37.0 42.1  MCV 95.1  --  94.0  PLT 187  --  190   Cardiac Enzymes: No results for input(s): CKTOTAL, CKMB, CKMBINDEX, TROPONINI in the last 168 hours. BNP: Invalid input(s): POCBNP CBG: No results for input(s): GLUCAP in the last 168 hours. D-Dimer No results for input(s): DDIMER in the last 72 hours. Hgb A1c Recent Labs    09/29/19 2106  HGBA1C 7.6*   Lipid Profile Recent Labs    09/30/19 0404  CHOL 116  HDL 45  LDLCALC 50  TRIG 107  CHOLHDL 2.6  Thyroid function studies No results for input(s): TSH, T4TOTAL, T3FREE, THYROIDAB in the last 72 hours.  Invalid input(s): FREET3 Anemia work up No results for input(s): VITAMINB12, FOLATE, FERRITIN, TIBC, IRON, RETICCTPCT in the last 72 hours. Urinalysis    Component Value Date/Time   COLORURINE YELLOW 09/04/2018 0137   APPEARANCEUR HAZY (A) 09/04/2018 0137   LABSPEC 1.010 09/04/2018 0137   PHURINE 8.0 09/04/2018 0137   GLUCOSEU NEGATIVE 09/04/2018 0137   HGBUR NEGATIVE 09/04/2018 0137   BILIRUBINUR NEGATIVE 09/04/2018 0137   KETONESUR NEGATIVE 09/04/2018 0137   PROTEINUR NEGATIVE 09/04/2018 0137   NITRITE NEGATIVE 09/04/2018 0137   LEUKOCYTESUR NEGATIVE 09/04/2018 0137   Sepsis Labs Invalid input(s): PROCALCITONIN,  WBC,  LACTICIDVEN Microbiology Recent Results (from the past 240 hour(s))  Respiratory Panel by RT PCR (Flu A&B, Covid) - Nasopharyngeal Swab     Status: None   Collection Time: 09/29/19  8:23 PM   Specimen: Nasopharyngeal Swab  Result Value Ref Range Status   SARS Coronavirus 2 by RT PCR NEGATIVE NEGATIVE Final    Comment: (NOTE) SARS-CoV-2 target nucleic acids are NOT DETECTED. The SARS-CoV-2  RNA is generally detectable in upper respiratoy specimens during the acute phase of infection. The lowest concentration of SARS-CoV-2 viral copies this assay can detect is 131 copies/mL. A negative result does not preclude SARS-Cov-2 infection and should not be used as the sole basis for treatment or other patient management decisions. A negative result may occur with  improper specimen collection/handling, submission of specimen other than nasopharyngeal swab, presence of viral mutation(s) within the areas targeted by this assay, and inadequate number of viral copies (<131 copies/mL). A negative result must be combined with clinical observations, patient history, and epidemiological information. The expected result is Negative. Fact Sheet for Patients:  PinkCheek.be Fact Sheet for Healthcare Providers:  GravelBags.it This test is not yet ap proved or cleared by the [place] FDA and  has been authorized for detection and/or diagnosis of SARS-CoV-2 by FDA under an Emergency Use Authorization (EUA). This EUA will remain  in effect (meaning this test can be used) for the duration of the COVID-19 declaration under Section 564(b)(1) of the Act, 21 U.S.C. section 360bbb-3(b)(1), unless the authorization is terminated or revoked sooner.    Influenza A by PCR NEGATIVE NEGATIVE Final   Influenza B by PCR NEGATIVE NEGATIVE Final    Comment: (NOTE) The Xpert Xpress SARS-CoV-2/FLU/RSV assay is intended as an aid in  the diagnosis of influenza from Nasopharyngeal swab specimens and  should not be used as a sole basis for treatment. Nasal washings and  aspirates are unacceptable for Xpert Xpress SARS-CoV-2/FLU/RSV  testing. Fact Sheet for Patients: PinkCheek.be Fact Sheet for Healthcare Providers: GravelBags.it This test is not yet approved or cleared by the [place] FDA  and  has been authorized for detection and/or diagnosis of SARS-CoV-2 by  FDA under an Emergency Use Authorization (EUA). This EUA will remain  in effect (meaning this test can be used) for the duration of the  Covid-19 declaration under Section 564(b)(1) of the Act, 21  U.S.C. section 360bbb-3(b)(1), unless the authorization is  terminated or revoked. Performed at [facility], [address] [place], [place] [address]      Time coordinating discharge: 35 minutes  SIGNED:   Rodena Goldmann, DO [facility] 09/30/2019, 6:14 PM  If 7PM-7AM, please contact night-coverage [email or web address]

## 2019-09-30 NOTE — Evaluation (Signed)
Occupational Therapy Evaluation Patient Details Name: [name] MRN: [id number] DOB: Apr 16, 1928 Today's Date: 09/30/2019    History of Present Illness [name] is a 86 y.o. female with past history of stroke, CAD, HTN, HLD, and DM presents emergency department by EMS with acute onset speech disturbance.  Patient states that she been feeling well for the day when she was being driven back to her house by her daughter.  Patient states that she suddenly began having trouble with her speech.  She was getting sounds out but states that they were nonsense type sounds and incomprehensible.  She felt she knew what she wanted to say but could not get it to come out properly.  This lasted for several minutes and EMS was called.  On their arrival, they report return to normal speech.  Patient feels like her speech is back to normal and she is able to get her words out.  She never had weakness, numbness, difficulty swallowing, or change in vision. No CP or heart palpitations.   Clinical Impression   Pt agreeable to OT/PT co-evaluation this am. Pt performing ADLs at baseline functioning, using RW for mobility with supervision. Pt reports family is available to assist 24/7 as needed with ADLs. No further OT services are required at this time as pt is at baseline with her ADLs.     Follow Up Recommendations  No OT follow up;Supervision/Assistance - 24 hour    Equipment Recommendations  None recommended by OT       Precautions / Restrictions Precautions Precautions: Fall Restrictions Weight Bearing Restrictions: No      Mobility Bed Mobility Overal bed mobility: Modified Independent                Transfers                 General transfer comment: Defer to PT note        ADL either performed or assessed with clinical judgement   ADL Overall ADL's : Needs assistance/impaired     Grooming: Wash/dry hands;Min guard;Standing               Lower Body Dressing: Set  up;Sitting/lateral leans   Toilet Transfer: Supervision/safety;Ambulation;RW           Functional mobility during ADLs: Supervision/safety;Rolling walker       Vision Baseline Vision/History: No visual deficits Patient Visual Report: No change from baseline Vision Assessment?: No apparent visual deficits            Pertinent Vitals/Pain Pain Assessment: No/denies pain     Hand Dominance Right   Extremity/Trunk Assessment Upper Extremity Assessment Upper Extremity Assessment: Overall WFL for tasks assessed   Lower Extremity Assessment Lower Extremity Assessment: Defer to PT evaluation   Cervical / Trunk Assessment Cervical / Trunk Assessment: Normal   Communication Communication Communication: No difficulties   Cognition Arousal/Alertness: Awake/alert Behavior During Therapy: WFL for tasks assessed/performed Overall Cognitive Status: Within Functional Limits for tasks assessed                                                Home Living Family/patient expects to be discharged to:: Private residence Living Arrangements: Children(staying with daughter) Available Help at Discharge: Family;Available 24 hours/day Type of Home: House       Home Layout: One level  Bathroom Shower/Tub: Teacher, early years/pre: Standard     Home Equipment: Environmental consultant - 2 wheels;Wheelchair - Biomedical scientist Comments: staying at daughter's while [facility] is [facility]      Prior Functioning/Environment Level of Independence: Needs assistance  Gait / Transfers Assistance Needed: Uses RW for mobility ADL's / Homemaking Assistance Needed: Daughter assisting with ADLs as needed            OT Problem List: Decreased activity tolerance                       Co-evaluation PT/OT/SLP Co-Evaluation/Treatment: Yes Reason for Co-Treatment: Complexity of the patient's impairments (multi-system involvement)   OT goals  addressed during session: ADL's and self-care;Proper use of Adaptive equipment and DME      AM-PAC OT "6 Clicks" Daily Activity     Outcome Measure Help from another person eating meals?: None Help from another person taking care of personal grooming?: None Help from another person toileting, which includes using toliet, bedpan, or urinal?: None Help from another person bathing (including washing, rinsing, drying)?: A Little Help from another person to put on and taking off regular upper body clothing?: A Little Help from another person to put on and taking off regular lower body clothing?: A Little 6 Click Score: 21   End of Session Equipment Utilized During Treatment: Rolling walker  Activity Tolerance: Patient tolerated treatment well Patient left: in chair;with call bell/phone within reach;with chair alarm set  OT Visit Diagnosis: Muscle weakness (generalized) (M62.81)                Time: 1828-8337 OT Time Calculation (min): 17 min Charges:  OT General Charges $OT Visit: 1 Visit OT Evaluation $OT Eval Low Complexity: [place], OTR/L  [phone] 09/30/2019, 9:24 AM

## 2019-09-30 NOTE — Plan of Care (Signed)
  Problem: Acute Rehab PT Goals(only PT should resolve) Goal: Pt Will Go Supine/Side To Sit Outcome: Progressing Flowsheets (Taken 09/30/2019 1104) Pt will go Supine/Side to Sit: Independently Goal: Patient Will Transfer Sit To/From Stand Outcome: Progressing Flowsheets (Taken 09/30/2019 1104) Patient will transfer sit to/from stand: with modified independence Goal: Pt Will Transfer Bed To Chair/Chair To Bed Outcome: Progressing Flowsheets (Taken 09/30/2019 1104) Pt will Transfer Bed to Chair/Chair to Bed: with modified independence Goal: Pt Will Ambulate Outcome: Progressing Flowsheets (Taken 09/30/2019 1104) Pt will Ambulate:  > 125 feet  with supervision  with rolling walker   11:05 AM, 09/30/19 Lonell Grandchild, MPT Physical Therapist with [facility] [phone] office [phone] mobile phone

## 2019-09-30 NOTE — TOC Initial Note (Signed)
Transition of Care [facility]) - Initial/Assessment Note    Patient Details  Name: [name] MRN: [id number] Date of Birth: [birth year]  Transition of Care [facility]) CM/SW Contact:    Boneta Lucks, RN Phone Number: 09/30/2019, 3:40 PM  Clinical Narrative:    Patient admitted for TIA, lives at home with her husband. PT recommending HHPT. Patient not ready for discharge. Referral given to [employer] with [facility] [place].     No PCP listed, per patient she goes to Dr Chapman Fitch at [facility] in [place].            Expected Discharge Plan: [place] Barriers to Discharge: Continued Medical Work up   Patient Goals and CMS Choice Patient states their goals for this hospitalization and ongoing recovery are:: to go home. CMS Medicare.gov Compare Post Acute Care list provided to:: Patient Choice offered to / list presented to : Patient  Expected Discharge Plan and Services Expected Discharge Plan: [place]: PT [place]: [facility] Date [place]: 09/30/19 Time [place]: [place] Representative spoke with at [place]: [place]  Prior Living Arrangements/Services       Do you feel safe going back to the place where you live?: No      Need for Family Participation in Patient Care: Yes (Comment) Care giver support system in place?: Yes (comment)   Criminal Activity/Legal Involvement Pertinent to Current Situation/Hospitalization: No - Comment as needed  Activities of Daily Living Home Assistive Devices/Equipment: Walker (specify type) ADL Screening (condition at time of admission) Patient's cognitive ability adequate to safely complete daily activities?: Yes Is the patient deaf or have difficulty hearing?: Yes Does the patient have difficulty seeing, even when wearing glasses/contacts?: No Does the patient have difficulty concentrating, remembering, or making decisions?:  Yes Patient able to express need for assistance with ADLs?: Yes Does the patient have difficulty dressing or bathing?: Yes Independently performs ADLs?: No Communication: Independent Dressing (OT): Independent Grooming: Independent Feeding: Independent Bathing: Needs assistance Is this a change from baseline?: Pre-admission baseline Toileting: Independent In/Out Bed: Independent Walks in Home: Independent with device (comment) Does the patient have difficulty walking or climbing stairs?: Yes Weakness of Legs: Left Weakness of Arms/Hands: None  Permission Sought/Granted     Emotional Assessment     Affect (typically observed): Accepting   Alcohol / Substance Use: Not Applicable Psych Involvement: No (comment)  Admission diagnosis:  TIA (transient ischemic attack) [G45.9] Patient Active Problem List   Diagnosis Date Noted  . Stroke ([place]) 09/06/2018  . PAF (paroxysmal atrial fibrillation) ([place]) 09/06/2018  . Moderate aortic stenosis 09/06/2018  . Moderate mitral regurgitation 09/06/2018  . Disorientation 09/06/2018  . Ischemic cardiomyopathy   . NSTEMI (non-ST elevated myocardial infarction) ([place])   . Arm pain, anterior, left 09/03/2018  . Angina at rest [facility]) 09/03/2018  . Abnormal EKG   . Elevated troponin   . Hypertensive urgency   . Acute left-sided weakness 05/07/2018  . Nonalcoholic hepatosteatosis 02/54/2706  . Acute CVA (cerebrovascular accident) ([place]) 05/07/2018  . Weakness 05/07/2018  . Unstable angina ([place]) 02/26/2018  . TIA (transient ischemic attack) 02/07/2011  . PFO (patent foramen ovale) 02/07/2011  . Type II diabetes mellitus ([place]) 09/30/2008  . CHANGE IN BOWELS 09/30/2008  . Hyperlipidemia 09/29/2008  . Essential hypertension 09/29/2008  . Coronary atherosclerosis 09/29/2008  . VENTRICULAR HYPERTROPHY, LEFT 09/29/2008  . ALLERGIC  RHINITIS, CHRONIC 09/29/2008  . OTHER CHRONIC NONALCOHOLIC LIVER DISEASE 15/99/6895  . LIVER FUNCTION TESTS,  ABNORMAL, HX OF 09/29/2008  . COLONIC POLYPS, HX OF 09/29/2008  . GERD (gastroesophageal reflux disease) 09/29/2008   PCP:  Leeanne Rio, MD Pharmacy:   [facility] [place] [address] [place] [address] Phone: [phone] Fax: [phone]  [facility] [place], [place] - [address] [place] [address] Phone: [phone] Fax: [phone]

## 2019-09-30 NOTE — Evaluation (Signed)
Physical Therapy Evaluation Patient Details Name: [name] MRN: [id number] DOB: 1928/02/22 Today's Date: 09/30/2019   History of Present Illness  [name] is a 86 y.o. female with past history of stroke, CAD, HTN, HLD, and DM presents emergency department by EMS with acute onset speech disturbance.  Patient states that she been feeling well for the day when she was being driven back to her house by her daughter.  Patient states that she suddenly began having trouble with her speech.  She was getting sounds out but states that they were nonsense type sounds and incomprehensible.  She felt she knew what she wanted to say but could not get it to come out properly.  This lasted for several minutes and EMS was called.  On their arrival, they report return to normal speech.  Patient feels like her speech is back to normal and she is able to get her words out.  She never had weakness, numbness, difficulty swallowing, or change in vision. No CP or heart palpitations.    Clinical Impression  Patient functioning near baseline for functional mobility and gait.  Patient demonstrates slightly labored movement for sitting up at bedside, sit to stands, transfers, able to ambulate in hallway without loss of balance and tolerated sitting up in chair after therapy.  Patient will benefit from continued physical therapy in hospital and recommended venue below to increase strength, balance, endurance for safe ADLs and gait.     Follow Up Recommendations Home health PT;Supervision for mobility/OOB;Supervision - Intermittent    Equipment Recommendations  None recommended by PT    Recommendations for Other Services       Precautions / Restrictions Precautions Precautions: Fall Restrictions Weight Bearing Restrictions: No      Mobility  Bed Mobility Overal bed mobility: Modified Independent                Transfers Overall transfer level: Needs assistance Equipment used: Rolling walker (2  wheeled) Transfers: Sit to/from [employer] Transfers Sit to Stand: Supervision;Min guard Stand pivot transfers: Supervision;Min guard       General transfer comment: increased time, labored movement  Ambulation/Gait Ambulation/Gait assistance: Supervision;Min guard Gait Distance (Feet): 85 Feet Assistive device: Rolling walker (2 wheeled) Gait Pattern/deviations: Decreased step length - left;Decreased stance time - right;Decreased stride length Gait velocity: decreased   General Gait Details: slightly labored cadence without loss of balance, limited secondary to fatigue  Stairs            Wheelchair Mobility    Modified Rankin (Stroke Patients Only)       Balance Overall balance assessment: Needs assistance Sitting-balance support: Feet supported;No upper extremity supported Sitting balance-Leahy Scale: Good Sitting balance - Comments: seated at EOB   Standing balance support: During functional activity;Bilateral upper extremity supported Standing balance-Leahy Scale: Fair Standing balance comment: fair/good using RW                             Pertinent Vitals/Pain Pain Assessment: 0-10 Pain Score: 6  Pain Location: frontal headache Pain Descriptors / Indicators: Headache Pain Intervention(s): Limited activity within patient's tolerance;Monitored during session    Home Living Family/patient expects to be discharged to:: Private residence Living Arrangements: Children Available Help at Discharge: Family;Available 24 hours/day Type of Home: House Home Access: Stairs to enter Entrance Stairs-Rails: None Entrance Stairs-Number of Steps: 1 step onto porch, and 2 steps into house Home Layout: One level Home Equipment: Environmental consultant -  2 wheels;Wheelchair - Sport and exercise psychologist Comments: staying at daughter's while husband is in hospital at [facility], information provided by patient describes her Daughter's home    Prior Function Level of  Independence: Needs assistance   Gait / Transfers Assistance Needed: household ambulator using RW  ADL's / Homemaking Assistance Needed: Daughter assisting with ADLs as needed        Hand Dominance   Dominant Hand: Right    Extremity/Trunk Assessment   Upper Extremity Assessment Upper Extremity Assessment: Defer to OT evaluation    Lower Extremity Assessment Lower Extremity Assessment: Generalized weakness    Cervical / Trunk Assessment Cervical / Trunk Assessment: Normal  Communication   Communication: No difficulties  Cognition Arousal/Alertness: Awake/alert Behavior During Therapy: WFL for tasks assessed/performed Overall Cognitive Status: Within Functional Limits for tasks assessed                                        General Comments      Exercises     Assessment/Plan    PT Assessment Patient needs continued PT services  PT Problem List Decreased strength;Decreased activity tolerance;Decreased balance;Decreased mobility       PT Treatment Interventions Gait training;Stair training;Functional mobility training;Therapeutic activities;Therapeutic exercise;Patient/family education    PT Goals (Current goals can be found in the Care Plan section)  Acute Rehab PT Goals Patient Stated Goal: return home with family to assist PT Goal Formulation: With patient Time For Goal Achievement: 10/03/19 Potential to Achieve Goals: Good    Frequency Min 3X/week   Barriers to discharge        Co-evaluation PT/OT/SLP Co-Evaluation/Treatment: Yes Reason for Co-Treatment: Complexity of the patient's impairments (multi-system involvement) PT goals addressed during session: Mobility/safety with mobility;Balance;Proper use of DME OT goals addressed during session: ADL's and self-care;Proper use of Adaptive equipment and DME       AM-PAC PT "6 Clicks" Mobility  Outcome Measure Help needed turning from your back to your side while in a flat bed  without using bedrails?: None Help needed moving from lying on your back to sitting on the side of a flat bed without using bedrails?: None Help needed moving to and from a bed to a chair (including a wheelchair)?: A Little Help needed standing up from a chair using your arms (e.g., wheelchair or bedside chair)?: A Little Help needed to walk in hospital room?: A Little Help needed climbing 3-5 steps with a railing? : A Little 6 Click Score: 20    End of Session   Activity Tolerance: Patient tolerated treatment well;Patient limited by fatigue Patient left: in chair;with call bell/phone within reach;with chair alarm set Nurse Communication: Mobility status PT Visit Diagnosis: Unsteadiness on feet (R26.81);Other abnormalities of gait and mobility (R26.89);Muscle weakness (generalized) (M62.81)    Time: 7782-4235 PT Time Calculation (min) (ACUTE ONLY): 22 min   Charges:   PT Evaluation $PT Eval Moderate Complexity: 1 Mod PT Treatments $Gait Training: 8-22 mins        11:03 AM, 09/30/19 Lonell Grandchild, MPT Physical Therapist with [facility] 336 [phone] office [phone] mobile phone

## 2019-09-30 NOTE — Discharge Instructions (Signed)
Transient Ischemic Attack  A transient ischemic attack (TIA) is a "warning stroke" that causes stroke-like symptoms that go away quickly. A TIA does not cause lasting damage to the brain. But having a TIA is a sign that you may be at risk for a stroke. Lifestyle changes and medical treatments can help prevent a stroke. It is important to know the symptoms of a TIA and what to do. Get help right away, even if your symptoms go away. The symptoms of a TIA are the same as those of a stroke. They can happen fast, and they usually go away within minutes or hours. They can include:  Weakness or loss of feeling in your face, arm, or leg. This often happens on one side of your body.  Trouble walking.  Trouble moving your arms or legs.  Trouble talking or understanding what people are saying.  Trouble seeing.  Seeing two of one object (double vision).  Feeling dizzy.  Feeling confused.  Loss of balance or coordination.  Feeling sick to your stomach (nauseous) and throwing up (vomiting).  A very bad headache for no reason. What increases the risk? Certain things may make you more likely to have a TIA. Some of these are things that you can change, such as:  Being very overweight (obese).  Using products that contain nicotine or tobacco, such as cigarettes and e-cigarettes.  Taking birth control pills.  Not being active.  Drinking too much alcohol.  Using drugs. Other risk factors include:  Having an irregular heartbeat (atrial fibrillation).  Being African American or Hispanic.  Having had blood clots, stroke, TIA, or heart attack in the past.  Being a woman with a history of high blood pressure in pregnancy (preeclampsia).  Being over the age of 56.  Being female.  Having family history of stroke.  Having the following diseases or conditions: ? High blood pressure. ? High cholesterol. ? Diabetes. ? Heart disease. ? Sickle cell disease. ? Sleep apnea. ? Migraine  headache. ? Long-term (chronic) diseases that cause soreness and swelling (inflammation). ? Disorders that affect how your blood clots. Follow these instructions at home: Medicines   Take over-the-counter and prescription medicines only as told by your doctor.  If you were told to take aspirin or another medicine to thin your blood, take it exactly as told by your doctor. ? Taking too much of the medicine can cause bleeding. ? Taking too little of the medicine may not work to treat the problem. Eating and drinking   Eat 5 or more servings of fruits and vegetables each day.  Follow instructions from your doctor about your diet. You may need to follow a certain diet to help lower your risk of having a stroke. You may need to: ? Eat a diet that is low in fat and salt. ? Eat foods that contain a lot of fiber. ? Limit the amount of carbohydrates and sugar in your diet.  Limit alcohol intake to 1 drink a day for nonpregnant women and 2 drinks a day for men. One drink equals 12 oz of beer, 5 oz of wine, or 1 oz of hard liquor. General instructions  Keep a healthy weight.  Stay active. Try to get at least 30 minutes of activity on all or most days.  Find out if you have a condition called sleep apnea. Get treatment if needed.  Do not use any products that contain nicotine or tobacco, such as cigarettes and e-cigarettes. If you need help quitting,  ask your doctor.  Do not abuse drugs.  Keep all follow-up visits as told by your doctor. This is important. Get help right away if:  You have any signs of stroke. "BE FAST" is an easy way to remember the main warning signs: ? B - Balance. Signs are dizziness, sudden trouble walking, or loss of balance. ? E - Eyes. Signs are trouble seeing or a sudden change in how you see. ? F - Face. Signs are sudden weakness or loss of feeling of the face, or the face or eyelid drooping on one side. ? A - Arms. Signs are weakness or loss of feeling in an  arm. This happens suddenly and usually on one side of the body. ? S - Speech. Signs are sudden trouble speaking, slurred speech, or trouble understanding what people say. ? T - Time. Time to call emergency services. Write down what time symptoms started.  You have other signs of stroke, such as: ? A sudden, very bad headache with no known cause. ? Feeling sick to your stomach (nausea). ? Throwing up (vomiting). ? Jerky movements that you cannot control (seizure). These symptoms may be an emergency. Do not wait to see if the symptoms will go away. Get medical help right away. Call your local emergency services (911 in the [place] Do not drive yourself to the hospital. Summary  A transient ischemic attack (TIA) is a "warning stroke" that causes stroke-like symptoms that go away quickly.  A TIA is a medical emergency. Get help right away, even if your symptoms go away.  A TIA does not cause lasting damage to the brain.  Having a TIA is a sign that you may be at risk for a stroke. Lifestyle changes and medical treatments can help prevent a stroke. This information is not intended to replace advice given to you by your health care provider. Make sure you discuss any questions you have with your health care provider. Document Revised: 02/15/2018 Document Reviewed: 08/23/2016 Elsevier Patient Education  [place].

## 2019-10-09 ENCOUNTER — Telehealth: Payer: Self-pay

## 2019-10-09 NOTE — Telephone Encounter (Signed)
  Patient Consent for Virtual Visit         [name] has provided verbal consent on 10/09/2019 for a virtual visit (video or telephone).   CONSENT FOR VIRTUAL VISIT FOR:  [name]  By participating in this virtual visit I agree to the following:  I hereby voluntarily request, consent and authorize [facility] and its employed or contracted physicians, physician assistants, nurse practitioners or other licensed health care professionals (the Practitioner), to provide me with telemedicine health care services (the "Services") as deemed necessary by the treating Practitioner. I acknowledge and consent to receive the Services by the Practitioner via telemedicine. I understand that the telemedicine visit will involve communicating with the Practitioner through live audiovisual communication technology and the disclosure of certain medical information by electronic transmission. I acknowledge that I have been given the opportunity to request an in-person assessment or other available alternative prior to the telemedicine visit and am voluntarily participating in the telemedicine visit.  I understand that I have the right to withhold or withdraw my consent to the use of telemedicine in the course of my care at any time, without affecting my right to future care or treatment, and that the Practitioner or I may terminate the telemedicine visit at any time. I understand that I have the right to inspect all information obtained and/or recorded in the course of the telemedicine visit and may receive copies of available information for a reasonable fee.  I understand that some of the potential risks of receiving the Services via telemedicine include:  Marland Kitchen Delay or interruption in medical evaluation due to technological equipment failure or disruption; . Information transmitted may not be sufficient (e.g. poor resolution of images) to allow for appropriate medical decision making by the Practitioner;  and/or  . In rare instances, security protocols could fail, causing a breach of personal health information.  Furthermore, I acknowledge that it is my responsibility to provide information about my medical history, conditions and care that is complete and accurate to the best of my ability. I acknowledge that Practitioner's advice, recommendations, and/or decision may be based on factors not within their control, such as incomplete or inaccurate data provided by me or distortions of diagnostic images or specimens that may result from electronic transmissions. I understand that the practice of medicine is not an exact science and that Practitioner makes no warranties or guarantees regarding treatment outcomes. I acknowledge that a copy of this consent can be made available to me via my patient portal ([facility]), or I can request a printed copy by calling the office of [facility].    I understand that my insurance will be billed for this visit.   I have read or had this consent read to me. . I understand the contents of this consent, which adequately explains the benefits and risks of the Services being provided via telemedicine.  . I have been provided ample opportunity to ask questions regarding this consent and the Services and have had my questions answered to my satisfaction. . I give my informed consent for the services to be provided through the use of telemedicine in my medical care

## 2019-10-14 ENCOUNTER — Telehealth (INDEPENDENT_AMBULATORY_CARE_PROVIDER_SITE_OTHER): Payer: Medicare Other | Admitting: Cardiovascular Disease

## 2019-10-14 ENCOUNTER — Encounter: Payer: Self-pay | Admitting: Cardiovascular Disease

## 2019-10-14 VITALS — BP 148/60 | HR 67 | Ht 61.0 in | Wt 157.0 lb

## 2019-10-14 DIAGNOSIS — I480 Paroxysmal atrial fibrillation: Secondary | ICD-10-CM

## 2019-10-14 DIAGNOSIS — I10 Essential (primary) hypertension: Secondary | ICD-10-CM

## 2019-10-14 DIAGNOSIS — I350 Nonrheumatic aortic (valve) stenosis: Secondary | ICD-10-CM

## 2019-10-14 DIAGNOSIS — I25118 Atherosclerotic heart disease of native coronary artery with other forms of angina pectoris: Secondary | ICD-10-CM

## 2019-10-14 DIAGNOSIS — I639 Cerebral infarction, unspecified: Secondary | ICD-10-CM

## 2019-10-14 DIAGNOSIS — E785 Hyperlipidemia, unspecified: Secondary | ICD-10-CM

## 2019-10-14 NOTE — Progress Notes (Signed)
Virtual Visit via Telephone Note   This visit type was conducted due to national recommendations for restrictions regarding the COVID-19 Pandemic (e.g. social distancing) in an effort to limit this patient's exposure and mitigate transmission in our community.  Due to her co-morbid illnesses, this patient is at least at moderate risk for complications without adequate follow up.  This format is felt to be most appropriate for this patient at this time.  The patient did not have access to video technology/had technical difficulties with video requiring transitioning to audio format only (telephone).  All issues noted in this document were discussed and addressed.  No physical exam could be performed with this format.  Please refer to the patient's chart for her  consent to telehealth for [facility].   The patient was identified using 2 identifiers.  Date:  10/14/2019   ID:  [name], DOB [birth year], MRN [id number]  Patient Location: Home Provider Location: Office  PCP:  Leeanne Rio, MD  Cardiologist:  Kate Sable, MD  Electrophysiologist:  None   Evaluation Performed:  Follow-Up Visit  Chief Complaint:  CAD, CHF, AS  History of Present Illness:    [name] is a 86 y.o. female with past medical history of CAD (s/p DES to distal RCA and DES to proximal LAD in 02/2018, DES to mid-LAD and DES to dRCA in 09/2018), moderate AS, PAF, HTN, HLD, Type 2 DM, and prior CVA (occurring in 05/2018 with repeat CVA in 08/2018), and dementia.  In summary, she sustained a non-STEMI in April 2020. Repeat catheterization was performed and showed severe stenosisof themid LAD just beyond the patent stented segmentwhich was treated with PCI/DES and severe stenosisof thedistal RCA just beyond the patent stented segment treated with PCI/DES x1. Her hospitalization was complicated by an acute CVA prior to her catheterization and she was discharged on Plavix and Eliquis with ASA being  discontinued given her increased bleeding risk.   She was again hospitalized in late April 2021 for possible TIA with dysarthria at presentation.  Brain MRI showed no acute findings of CVA.  Echocardiogram demonstrated normalization of LV systolic function, LVEF up to 55 to 60% with grade 1 diastolic dysfunction.  Her only complaints relate to memory impairment and hearing loss.  The patient denies any symptoms of chest pain, palpitations, shortness of breath, lightheadedness, dizziness, leg swelling, orthopnea, PND, and syncope.  She uses a walker to ambulate.   Past Medical History:  Diagnosis Date  . Allergic rhinitis   . Allergy to IVP dye   . Arthritis    "probably in my hands" (02/26/2018)  . Colonic polyp   . Coronary artery disease    a. cath 02/2018 PCI with DES x1 to the distal RCA and DES x1 to the proximal LAD. b. NSTEMI 09/2018 s/p DES to dRCA and DES to prox LAD just beyond stented segments, residual moderate OM2 disease for medical therapy.  . CVA (cerebral vascular accident) ([place])   . Dementia ([place])   . GERD (gastroesophageal reflux disease)   . History of blood transfusion    "don't remember why or when" (02/26/2018)  . Hyperlipidemia   . Hypertension   . Ischemic cardiomyopathy    a. EF 40-45% by echo 09/2018.  . Moderate aortic stenosis   . Moderate mitral regurgitation   . Nonalcoholic hepatosteatosis   . PAF (paroxysmal atrial fibrillation) ([place])   . TIA (transient ischemic attack) 2000s   "I've had ~ 3" (02/26/2018)  .  Type II diabetes mellitus ([place])    Past Surgical History:  Procedure Laterality Date  . ABDOMINAL HYSTERECTOMY    . APPENDECTOMY  1950s  . CARDIAC CATHETERIZATION  2001   Nonobstructive CAD  . CATARACT EXTRACTION W/ INTRAOCULAR LENS  IMPLANT, BILATERAL Bilateral 2000s  . CORONARY STENT INTERVENTION N/A 02/26/2018   Procedure: CORONARY STENT INTERVENTION;  Surgeon: Martinique, Peter M, MD;  Location: [facility];  Service: Cardiovascular;   Laterality: N/A;  rca   . CORONARY STENT INTERVENTION N/A 09/05/2018   Procedure: CORONARY STENT INTERVENTION;  Surgeon: Burnell Blanks, MD;  Location: [facility];  Service: Cardiovascular;  Laterality: N/A;  . LEFT HEART CATH AND CORONARY ANGIOGRAPHY N/A 02/26/2018   Procedure: LEFT HEART CATH AND CORONARY ANGIOGRAPHY;  Surgeon: Martinique, Peter M, MD;  Location: [facility];  Service: Cardiovascular;  Laterality: N/A;  . LEFT HEART CATH AND CORONARY ANGIOGRAPHY N/A 09/05/2018   Procedure: LEFT HEART CATH AND CORONARY ANGIOGRAPHY;  Surgeon: Burnell Blanks, MD;  Location: [facility];  Service: Cardiovascular;  Laterality: N/A;  . TONSILLECTOMY  1943     Current Meds  Medication Sig  . amLODipine (NORVASC) 10 MG tablet Take 10 mg by mouth every evening.  . carboxymethylcellulose (REFRESH) 1 % ophthalmic solution Place 1 drop into both eyes 2 (two) times daily as needed (dry eyes).   . clopidogrel (PLAVIX) 75 MG tablet Take 1 tablet (75 mg total) by mouth daily. (Patient taking differently: Take 75 mg by mouth in the morning. )  . ELIQUIS 5 MG TABS tablet Take 5 mg by mouth 2 (two) times daily.   . fexofenadine (ALLEGRA) 180 MG tablet Take 180 mg by mouth daily.    Marland Kitchen losartan (COZAAR) 100 MG tablet Take 100 mg by mouth in the morning.   . metFORMIN (GLUCOPHAGE-XR) 500 MG 24 hr tablet Take 1 tablet (500 mg total) by mouth 2 (two) times daily with a meal.  . metoprolol succinate (TOPROL-XL) 100 MG 24 hr tablet Take 100 mg by mouth in the morning and at bedtime. Take with or immediately following a meal.   . nitroGLYCERIN (NITROSTAT) 0.4 MG SL tablet Place 0.4 mg under the tongue every 5 (five) minutes as needed for chest pain.  . pantoprazole (PROTONIX) 40 MG tablet Take 1 tablet (40 mg total) by mouth daily.  . rosuvastatin (CRESTOR) 20 MG tablet Take 1 tablet (20 mg total) by mouth every evening.  . ursodiol (ACTIGALL) 300 MG capsule Take 300 mg by mouth 2 (two)  times daily.      Allergies:   Ivp dye [iodinated diagnostic agents], Sulfonamide derivatives, Lipitor [atorvastatin calcium], Zetia [ezetimibe], and Penicillins   Social History   Tobacco Use  . Smoking status: Former Smoker    Packs/day: 2.00    Years: 40.00    Pack years: 80.00    Types: Cigarettes    Quit date: 06/05/1992    Years since quitting: 27.3  . Smokeless tobacco: Former Systems developer    Types: Chew    Quit date: 12/03/1992  . Tobacco comment: used tobacco to help quit smoking  Substance Use Topics  . Alcohol use: Never  . Drug use: Never     Family Hx: The patient's family history includes Heart failure in her mother; Prostate cancer in her brother and father.  ROS:   Please see the history of present illness.     All other systems reviewed and are negative.   Prior  CV studies:   The following studies were reviewed today:  Echocardiogram: 09/30/2019 IMPRESSIONS  1. Left ventricular ejection fraction, by estimation, is 55 to 60%. The left ventricle has normal function. The left ventricle has no regional wall motion abnormalities. There is mild left ventricular hypertrophy.  Left ventricular diastolic parameters are consistent with Grade I diastolic dysfunction (impaired relaxation).  2. Right ventricular systolic function is normal. The right ventricular size is normal. Tricuspid regurgitation signal is inadequate for assessing PA pressure.  3. Left atrial size was mildly dilated.  4. The mitral valve is grossly normal. Trivial mitral valve  regurgitation.  5. The aortic valve is tricuspid. Moderate aortic valve stenosis. Aortic valve area, by VTI measures 1.17 cm. Aortic valve mean gradient measures 11.3 mmHg. Aortic valve Vmax measures 2.24 m/s. Dimentionless index 0.46.  6. The inferior vena cava is normal in size with greater than 50% respiratory variability, suggesting right atrial pressure of 3 mmHg.  7. No obvious PFO visualized.     Cardiac  Catheterization: 09/05/2018  Prox RCA to Mid RCA lesion is 30% stenosed.  Mid RCA lesion is 30% stenosed.  Previously placed Mid RCA to Dist RCA stent (unknown type) is widely patent.  Dist RCA lesion is 99% stenosed.  A drug-eluting stent was successfully placed using a STENT SYNERGY DES 3X12.  Post intervention, there is a 0% residual stenosis.  Ost 2nd Mrg lesion is 70% stenosed.  Previously placed Ost LAD to Prox LAD stent (unknown type) is widely patent.  Prox LAD lesion is 90% stenosed.  A drug-eluting stent was successfully placed using a STENT SYNERGY DES 2.75X12.  Post intervention, there is a 0% residual stenosis.  1. Severe stenosis mid LAD just beyond the patent stented segment. Successful PTCA/DES x 1 mid LAD 2. Severe stenosis distal RCA just beyond the patent stented segment. Successful PTCA/DES x 1 distal RCA 3. Moderate stenosis ostium of the small caliber second obtuse marginal branch.    Labs/Other Tests and Data Reviewed:    EKG: I personally reviewed the ECG performed on 09/29/2019 which showed sinus rhythm with old inferior infarct, T wave inversions in high lateral leads, and otherwise diffuse nonspecific T wave abnormalities.  Recent Labs: 09/29/2019: ALT 13 09/30/2019: BUN 15; Creatinine, Ser 0.49; Hemoglobin 13.8; Platelets 190; Potassium 4.1; Sodium 141   Recent Lipid Panel Lab Results  Component Value Date/Time   CHOL 116 09/30/2019 04:04 AM   TRIG 107 09/30/2019 04:04 AM   HDL 45 09/30/2019 04:04 AM   CHOLHDL 2.6 09/30/2019 04:04 AM   LDLCALC 50 09/30/2019 04:04 AM    Wt Readings from Last 3 Encounters:  10/14/19 157 lb (71.2 kg)  09/29/19 160 lb (72.6 kg)  12/03/18 156 lb (70.8 kg)     Objective:    Vital Signs:  BP (!) 148/60   Pulse 67   Ht 5' 1"  (1.549 m)   Wt 157 lb (71.2 kg)   BMI 29.66 kg/m    VITAL SIGNS:  reviewed  ASSESSMENT & PLAN:    1.  Coronary artery disease: Status post drug-eluting stent to the distal RCA  and DES to proximal LAD in 02/2018 with DES to mid-LAD and DES to [place] in 09/2018.  I will discontinue clopidogrel and start aspirin 81 mg daily.  Continue beta-blocker and rosuvastatin.  2.  Paroxysmal atrial fibrillation: Symptomatically stable on Toprol-XL.  Continue Eliquis 5 mg twice daily for anticoagulation.  3.  Aortic stenosis: Moderate in severity by echocardiogram in April 2021.  I will continue to monitor.  4.  Hypertension: Blood pressure is mildly elevated.  This will need continued monitoring.  No changes to therapy today.  5.  Hyperlipidemia: Lipids reviewed above.  Continue rosuvastatin 20 mg daily.  6.  Recurrent CVA: Followed by neurology.  I will discontinue clopidogrel as it has been over 1 year since drug-eluting stent placement.  I will switch to aspirin 81 mg daily.  Continue Eliquis 5 mg twice daily for paroxysmal atrial fibrillation.  Continue rosuvastatin.    COVID-19 Education: The signs and symptoms of COVID-19 were discussed with the patient and how to seek care for testing (follow up with PCP or arrange E-visit).  The importance of social distancing was discussed today.  Time:   Today, I have spent 25 minutes with the patient with telehealth technology discussing the above problems.     Medication Adjustments/Labs and Tests Ordered: Current medicines are reviewed at length with the patient today.  Concerns regarding medicines are outlined above.   Tests Ordered: No orders of the defined types were placed in this encounter.   Medication Changes: No orders of the defined types were placed in this encounter.   Follow Up: Office visit 6 months  Signed, Kate Sable, MD  10/14/2019 12:03 PM    [place]

## 2019-10-14 NOTE — Addendum Note (Signed)
Addended by: Barbarann Ehlers A on: 10/14/2019 12:25 PM   Modules accepted: Orders

## 2019-10-14 NOTE — Patient Instructions (Signed)
Medication Instructions: STOP Plavix   Take Enteric Coated Aspirin 81 mg daily   Labwork: None today  Procedures/Testing: None today  Follow-Up: 6 months Office visit with Dr.Koneswaran  Any Additional Special Instructions Will Be Listed Below (If Applicable).     If you need a refill on your cardiac medications before your next appointment, please call your pharmacy.       Thank you for choosing [facility] !

## 2020-04-01 ENCOUNTER — Encounter: Payer: Self-pay | Admitting: Student

## 2020-04-01 ENCOUNTER — Other Ambulatory Visit: Payer: Self-pay

## 2020-04-01 ENCOUNTER — Ambulatory Visit (INDEPENDENT_AMBULATORY_CARE_PROVIDER_SITE_OTHER): Payer: Medicare Other | Admitting: Student

## 2020-04-01 VITALS — BP 132/68 | HR 74 | Ht 64.0 in | Wt 156.0 lb

## 2020-04-01 DIAGNOSIS — I10 Essential (primary) hypertension: Secondary | ICD-10-CM

## 2020-04-01 DIAGNOSIS — I480 Paroxysmal atrial fibrillation: Secondary | ICD-10-CM | POA: Diagnosis not present

## 2020-04-01 DIAGNOSIS — I2510 Atherosclerotic heart disease of native coronary artery without angina pectoris: Secondary | ICD-10-CM

## 2020-04-01 DIAGNOSIS — Z8679 Personal history of other diseases of the circulatory system: Secondary | ICD-10-CM | POA: Diagnosis not present

## 2020-04-01 DIAGNOSIS — I350 Nonrheumatic aortic (valve) stenosis: Secondary | ICD-10-CM

## 2020-04-01 NOTE — Patient Instructions (Signed)
Medication Instructions:  Your physician recommends that you continue on your current medications as directed. Please refer to the Current Medication list given to you today.  *If you need a refill on your cardiac medications before your next appointment, please call your pharmacy*   Lab Work: NONE   If you have labs (blood work) drawn today and your tests are completely normal, you will receive your results only by: Marland Kitchen MyChart Message (if you have MyChart) OR . A paper copy in the mail If you have any lab test that is abnormal or we need to change your treatment, we will call you to review the results.   Testing/Procedures: NONE    Follow-Up: At [facility], you and your health needs are our priority.  As part of our continuing mission to provide you with exceptional heart care, we have created designated Provider Care Teams.  These Care Teams include your primary Cardiologist (physician) and Advanced Practice Providers (APPs -  Physician Assistants and Nurse Practitioners) who all work together to provide you with the care you need, when you need it.  We recommend signing up for the patient portal called "MyChart".  Sign up information is provided on this After Visit Summary.  MyChart is used to connect with patients for Virtual Visits (Telemedicine).  Patients are able to view lab/test results, encounter notes, upcoming appointments, etc.  Non-urgent messages can be sent to your provider as well.   To learn more about what you can do with MyChart, go to [email or web address].    Your next appointment:   6 month(s)  The format for your next appointment:   In Person  Provider:   Rozann Lesches, MD or Bernerd Pho, PA-C   Other Instructions Thank you for choosing [facility]!

## 2020-04-01 NOTE — Progress Notes (Signed)
Cardiology Office Note    Date:  04/01/2020   ID:  [name], DOB 05-Jan-1928, MRN [id number]  PCP:  Leeanne Rio, MD  Cardiologist: Kate Sable, MD (Inactive)  --> Will switch to Dr. Domenic Polite  Chief Complaint  Patient presents with  . Follow-up    6 month visit    History of Present Illness:    [name] is a 86 y.o. female with past medical history of CAD (s/p DES to distal RCA and DES to proximal LAD in 02/2018, DES to mid-LAD and DES to dRCA in 09/2018), ischemic cardiomyopathy (EF 40-45% by echo in 09/2018, normalized to 55-60% by repeat echo in 09/2019), moderate AS, PAF, HTN, HLD, Type 2 DM, and prior CVA (occurring in 05/2018 with repeat CVA in 08/2018) and dementia who presents to the office today for 49-monthfollow-up.  She most recently had a telehealth visit with Dr. KBronson [name] 10/2019 and had recently been hospitalized for a possible TIA as she had presented with dysarthria but Brain MRI showed no evidence of an acute CVA. She did have a repeat echocardiogram that admission which showed her EF had improved to 55 to 60%. She denied any recent anginal symptoms at the time of her visit. Plavix was discontinued and transitioned to ASA given she was over a year out from her last PCI and was continued on ASA, beta-blocker, statin therapy and Eliquis for anticoagulation.  In talking with the patient today, she reports overall doing well since her last visit. She stays at home but has 24/7 caregivers as her husband has been in and out of the hospital multiple times throughout the year. He is currently at rehab but plans to return home within the next several weeks. She ambulates with a walker and can perform ADL's independently but has help with cooking and cleaning. Family members assist with her medications. She did fall 2-3 months out due to losing her balance but did not hit her head. No associated dizziness or syncope.   She denies any recent chest pain or  dyspnea on exertion. No recent orthopnea, PND, lower extremity edema or palpitations.   Past Medical History:  Diagnosis Date  . Allergic rhinitis   . Allergy to IVP dye   . Arthritis    "probably in my hands" (02/26/2018)  . Colonic polyp   . Coronary artery disease    a. cath 02/2018 PCI with DES x1 to the distal RCA and DES x1 to the proximal LAD. b. NSTEMI 09/2018 s/p DES to dRCA and DES to prox LAD just beyond stented segments, residual moderate OM2 disease for medical therapy.  . CVA (cerebral vascular accident) ([place]   . Dementia ([facility]   . GERD (gastroesophageal reflux disease)   . History of blood transfusion    "don't remember why or when" (02/26/2018)  . Hyperlipidemia   . Hypertension   . Ischemic cardiomyopathy    a. EF 40-45% by echo 09/2018.  . Moderate aortic stenosis   . Moderate mitral regurgitation   . Nonalcoholic hepatosteatosis   . PAF (paroxysmal atrial fibrillation) ([facility]   . TIA (transient ischemic attack) 2000s   "I've had ~ 3" (02/26/2018)  . Type II diabetes mellitus ([facility]     Past Surgical History:  Procedure Laterality Date  . ABDOMINAL HYSTERECTOMY    . APPENDECTOMY  1950s  . CARDIAC CATHETERIZATION  2001   Nonobstructive CAD  . CATARACT EXTRACTION W/ INTRAOCULAR LENS  IMPLANT, BILATERAL Bilateral 2000s  .  CORONARY STENT INTERVENTION N/A 02/26/2018   Procedure: CORONARY STENT INTERVENTION;  Surgeon: Martinique, Peter M, MD;  Location: [facility];  Service: Cardiovascular;  Laterality: N/A;  rca   . CORONARY STENT INTERVENTION N/A 09/05/2018   Procedure: CORONARY STENT INTERVENTION;  Surgeon: Burnell Blanks, MD;  Location: [facility];  Service: Cardiovascular;  Laterality: N/A;  . LEFT HEART CATH AND CORONARY ANGIOGRAPHY N/A 02/26/2018   Procedure: LEFT HEART CATH AND CORONARY ANGIOGRAPHY;  Surgeon: Martinique, Peter M, MD;  Location: [facility];  Service: Cardiovascular;  Laterality: N/A;  . LEFT HEART CATH AND CORONARY  ANGIOGRAPHY N/A 09/05/2018   Procedure: LEFT HEART CATH AND CORONARY ANGIOGRAPHY;  Surgeon: Burnell Blanks, MD;  Location: [facility];  Service: Cardiovascular;  Laterality: N/A;  . TONSILLECTOMY  1943    Current Medications: Outpatient Medications Prior to Visit  Medication Sig Dispense Refill  . amLODipine (NORVASC) 10 MG tablet Take 10 mg by mouth every evening.    Marland Kitchen aspirin EC 81 MG tablet Take 81 mg by mouth daily.    . carboxymethylcellulose (REFRESH) 1 % ophthalmic solution Place 1 drop into both eyes 2 (two) times daily as needed (dry eyes).     Marland Kitchen ELIQUIS 5 MG TABS tablet Take 5 mg by mouth 2 (two) times daily.     . fexofenadine (ALLEGRA) 180 MG tablet Take 180 mg by mouth daily.      Marland Kitchen losartan (COZAAR) 100 MG tablet Take 100 mg by mouth in the morning.     . metFORMIN (GLUCOPHAGE-XR) 500 MG 24 hr tablet Take 1 tablet (500 mg total) by mouth 2 (two) times daily with a meal.    . metoprolol succinate (TOPROL-XL) 100 MG 24 hr tablet Take 100 mg by mouth in the morning and at bedtime. Take with or immediately following a meal.     . nitroGLYCERIN (NITROSTAT) 0.4 MG SL tablet Place 0.4 mg under the tongue every 5 (five) minutes as needed for chest pain.    . pantoprazole (PROTONIX) 40 MG tablet Take 1 tablet (40 mg total) by mouth daily. 90 tablet 3  . rosuvastatin (CRESTOR) 20 MG tablet Take 1 tablet (20 mg total) by mouth every evening. 90 tablet 3  . ursodiol (ACTIGALL) 300 MG capsule Take 300 mg by mouth 2 (two) times daily.      No facility-administered medications prior to visit.     Allergies:   Ivp dye [iodinated diagnostic agents], Sulfonamide derivatives, Lipitor [atorvastatin calcium], Zetia [ezetimibe], and Penicillins   Social History   Socioeconomic History  . Marital status: Married    Spouse name: Not on file  . Number of children: Not on file  . Years of education: Not on file  . Highest education level: Not on file  Occupational History  . Not  on file  Tobacco Use  . Smoking status: Former Smoker    Packs/day: 2.00    Years: 40.00    Pack years: 80.00    Types: Cigarettes    Quit date: 06/05/1992    Years since quitting: 27.8  . Smokeless tobacco: Former Systems developer    Types: Chew    Quit date: 12/03/1992  . Tobacco comment: used tobacco to help quit smoking  Vaping Use  . Vaping Use: Never used  Substance and Sexual Activity  . Alcohol use: Never  . Drug use: Never  . Sexual activity: Not on file  Other Topics Concern  . Not on  file  Social History Narrative  . Not on file   Social Determinants of Health   Financial Resource Strain:   . Difficulty of Paying Living Expenses: Not on file  Food Insecurity:   . Worried About Charity fundraiser in the Last Year: Not on file  . Ran Out of Food in the Last Year: Not on file  Transportation Needs:   . Lack of Transportation (Medical): Not on file  . Lack of Transportation (Non-Medical): Not on file  Physical Activity:   . Days of Exercise per Week: Not on file  . Minutes of Exercise per Session: Not on file  Stress:   . Feeling of Stress : Not on file  Social Connections:   . Frequency of Communication with Friends and Family: Not on file  . Frequency of Social Gatherings with Friends and Family: Not on file  . Attends Religious Services: Not on file  . Active Member of Clubs or Organizations: Not on file  . Attends Archivist Meetings: Not on file  . Marital Status: Not on file     Family History:  The patient's family history includes Heart failure in her mother; Prostate cancer in her brother and father.   Review of Systems:   Please see the history of present illness.     General:  No chills, fever, night sweats or weight changes. Positive for generalized weakness.  Cardiovascular:  No chest pain, dyspnea on exertion, edema, orthopnea, palpitations, paroxysmal nocturnal dyspnea. Dermatological: No rash, lesions/masses Respiratory: No cough,  dyspnea Urologic: No hematuria, dysuria Abdominal:   No nausea, vomiting, diarrhea, bright red blood per rectum, melena, or hematemesis Neurologic:  No visual changes. Positive for memory loss.   All other systems reviewed and are otherwise negative except as noted above.   Physical Exam:    VS:  BP 132/68   Pulse 74   Ht 5' 4"  (1.626 m)   Wt 156 lb (70.8 kg)   SpO2 92%   BMI 26.78 kg/m    General: Well developed, elderly female appearing in no acute distress. Head: Normocephalic, atraumatic. Neck: No carotid bruits. JVD not elevated.  Lungs: Respirations regular and unlabored, without wheezes or rales.  Heart: Regular rate and rhythm. No S3 or S4.  2/6 SEM along RUSB. Abdomen: Appears non-distended. No obvious abdominal masses. Msk:  Strength and tone appear normal for age. No obvious joint deformities or effusions. Extremities: No clubbing or cyanosis. No lower extremity edema.  Distal pedal pulses are 2+ bilaterally. Neuro: Alert and oriented X 3. Moves all extremities spontaneously. No focal deficits noted. Psych:  Responds to questions appropriately with a normal affect. Skin: No rashes or lesions noted  Wt Readings from Last 3 Encounters:  04/01/20 156 lb (70.8 kg)  10/14/19 157 lb (71.2 kg)  09/29/19 160 lb (72.6 kg)     Studies/Labs Reviewed:   EKG:  EKG is not ordered today.    Recent Labs: 09/29/2019: ALT 13 09/30/2019: BUN 15; Creatinine, Ser 0.49; Hemoglobin 13.8; Platelets 190; Potassium 4.1; Sodium 141   Lipid Panel    Component Value Date/Time   CHOL 116 09/30/2019 0404   TRIG 107 09/30/2019 0404   HDL 45 09/30/2019 0404   CHOLHDL 2.6 09/30/2019 0404   VLDL 21 09/30/2019 0404   LDLCALC 50 09/30/2019 0404    Additional studies/ records that were reviewed today include:   Cardiac Catheterization: 09/2018  Prox RCA to Mid RCA lesion is 30% stenosed.  Mid RCA  lesion is 30% stenosed.  Previously placed Mid RCA to Dist RCA stent (unknown type) is  widely patent.  Dist RCA lesion is 99% stenosed.  A drug-eluting stent was successfully placed using a STENT SYNERGY DES 3X12.  Post intervention, there is a 0% residual stenosis.  Ost 2nd Mrg lesion is 70% stenosed.  Previously placed Ost LAD to Prox LAD stent (unknown type) is widely patent.  Prox LAD lesion is 90% stenosed.  A drug-eluting stent was successfully placed using a STENT SYNERGY DES 2.75X12.  Post intervention, there is a 0% residual stenosis.   1. Severe stenosis mid LAD just beyond the patent stented segment. Successful PTCA/DES x 1 mid LAD 2. Severe stenosis distal RCA just beyond the patent stented segment. Successful PTCA/DES x 1 distal RCA 3. Moderate stenosis ostium of the small caliber second obtuse marginal branch.    Echo: 09/2019 IMPRESSIONS    1. Left ventricular ejection fraction, by estimation, is 55 to 60%. The  left ventricle has normal function. The left ventricle has no regional  wall motion abnormalities. There is mild left ventricular hypertrophy.  Left ventricular diastolic parameters  are consistent with Grade I diastolic dysfunction (impaired relaxation).  2. Right ventricular systolic function is normal. The right ventricular  size is normal. Tricuspid regurgitation signal is inadequate for assessing  PA pressure.  3. Left atrial size was mildly dilated.  4. The mitral valve is grossly normal. Trivial mitral valve  regurgitation.  5. The aortic valve is tricuspid. Moderate aortic valve stenosis. Aortic  valve area, by VTI measures 1.17 cm. Aortic valve mean gradient measures  11.3 mmHg. Aortic valve Vmax measures 2.24 m/s. Dimentionless index 0.46.  6. The inferior vena cava is normal in size with greater than 50%  respiratory variability, suggesting right atrial pressure of 3 mmHg.  7. No obvious PFO visualized.     Assessment:    1. Coronary artery disease involving native coronary artery of native heart without angina  pectoris   2. History of ischemic cardiomyopathy   3. Moderate aortic stenosis   4. PAF (paroxysmal atrial fibrillation) ([place])   5. Essential hypertension      Plan:   In order of problems listed above:  1. CAD - She is s/p DES to distal RCA and DES to proximal LAD in 02/2018 with DES to mid-LAD and DES to dRCA in 09/2018. - She is not overly active at baseline due to leg weakness and utilizes a walker for ambulation. She denies any recent anginal symptoms. - Will continue with current medical therapy including ASA 81 mg daily, Toprol-XL 100 mg twice daily and Crestor 20 mg daily (LDL was at 50 in 09/2019).  2. History of Ischemic Cardiomyopathy - Her EF was previously reduced to 40-45% by echo in 09/2018, normalized to 55-60% by repeat echo in 09/2019. She denies any recent orthopnea, PND or lower extremity edema. Appears euvolemic by examination today. Continue current medication regimen with Losartan 100 mg daily and Toprol-XL 100 mg twice daily.  3. Aortic Stenosis - This was moderate by most recent echocardiogram in 09/2019. Will continue to follow.   4. Paroxysmal Atrial Fibrillation - She denies any recent palpitations and is in normal sinus rhythm by examination today. She remains on Toprol-XL 100 mg twice daily for rate control. - Denies any evidence of active bleeding. Remains on Eliquis 73m BID for anticoagulation. She is on the appropriate dose as her only indication for reduced dosing at this time is her age  as creatinine was stable at 0.72 by labs in 11/2019 and weight is at 70.8 kg.  5. HTN - BP is well controlled at 132/68 during today's visit.  Continue current medication regimen.   Medication Adjustments/Labs and Tests Ordered: Current medicines are reviewed at length with the patient today.  Concerns regarding medicines are outlined above.  Medication changes, Labs and Tests ordered today are listed in the Patient Instructions below. Patient Instructions    Medication Instructions:  Your physician recommends that you continue on your current medications as directed. Please refer to the Current Medication list given to you today.  *If you need a refill on your cardiac medications before your next appointment, please call your pharmacy*   Lab Work: NONE   If you have labs (blood work) drawn today and your tests are completely normal, you will receive your results only by: Marland Kitchen MyChart Message (if you have MyChart) OR . A paper copy in the mail If you have any lab test that is abnormal or we need to change your treatment, we will call you to review the results.   Testing/Procedures: NONE    Follow-Up: At [facility], you and your health needs are our priority.  As part of our continuing mission to provide you with exceptional heart care, we have created designated Provider Care Teams.  These Care Teams include your primary Cardiologist (physician) and Advanced Practice Providers (APPs -  Physician Assistants and Nurse Practitioners) who all work together to provide you with the care you need, when you need it.  We recommend signing up for the patient portal called "MyChart".  Sign up information is provided on this After Visit Summary.  MyChart is used to connect with patients for Virtual Visits (Telemedicine).  Patients are able to view lab/test results, encounter notes, upcoming appointments, etc.  Non-urgent messages can be sent to your provider as well.   To learn more about what you can do with MyChart, go to [email or web address].    Your next appointment:   6 month(s)  The format for your next appointment:   In Person  Provider:   Rozann Lesches, MD or Bernerd Pho, PA-C   Other Instructions Thank you for choosing [facility]!       Signed, Erma Heritage, PA-C  04/01/2020 4:33 PM    [facility] [address] [place], [place] [address] Phone: [phone] Fax: [phone]

## 2020-04-05 IMAGING — CT CT HEAD WITHOUT CONTRAST
4 series · 16 of 47 positions shown, 18 images · non-contrast
Comparison: CT HEAD May 31, 2018

CLINICAL DATA: LEFT arm pain since yesterday. History of stroke and
LEFT-sided weakness.

EXAM:
CT HEAD WITHOUT CONTRAST
TECHNIQUE: Contiguous axial images were obtained from the base of the skull
through the vertex without intravenous contrast.

[Series 2: head trauma wo · axial · 0.44mm/px · z∈[+59,+174]mm · 7 of 31 slices shown, 9 images]
[im 4/31  brain]
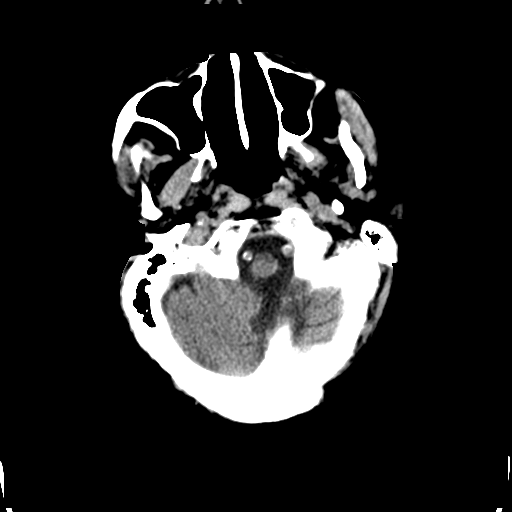
[im 4/31  bone]
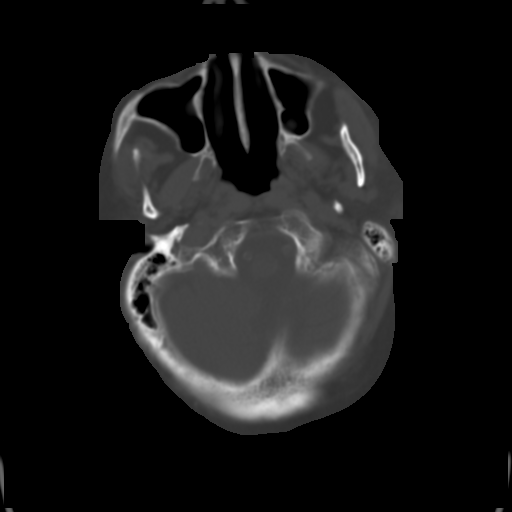
[im 8/31  brain]
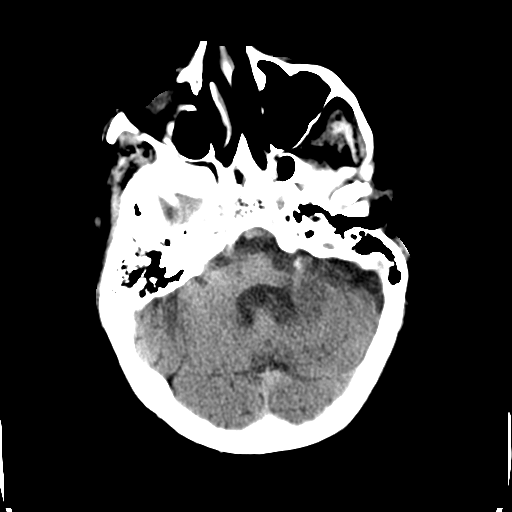
[im 12/31  brain]
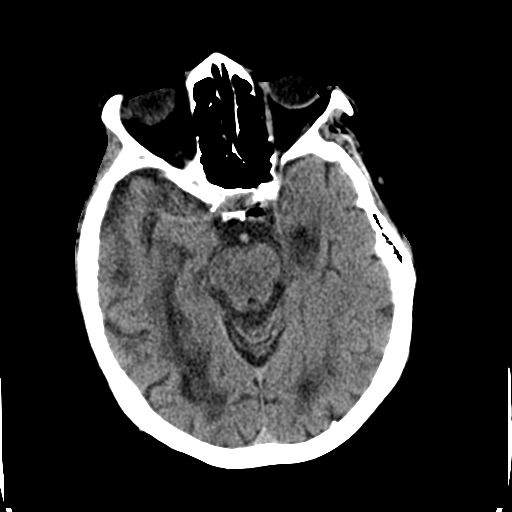
[im 16/31  brain]
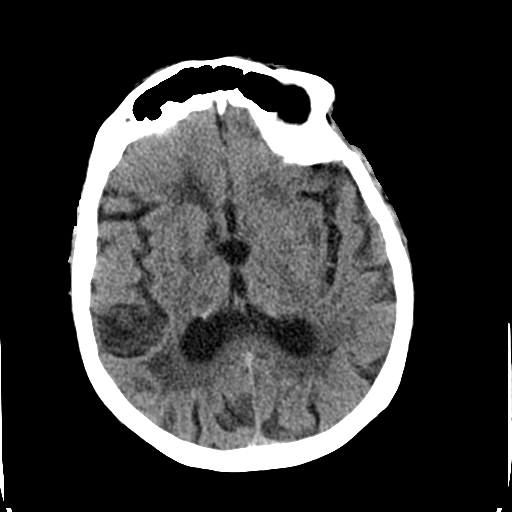
[im 19/31  brain]
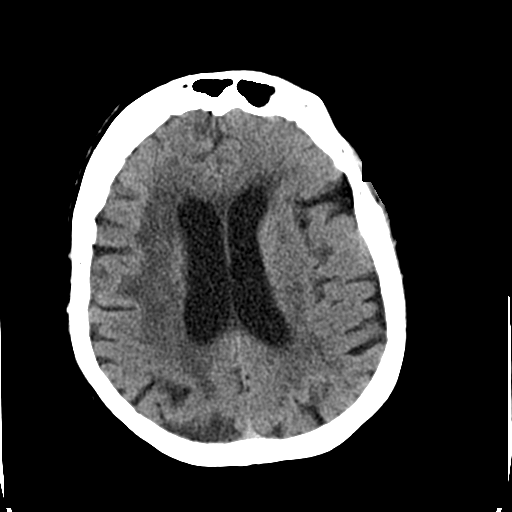
[im 19/31  bone]
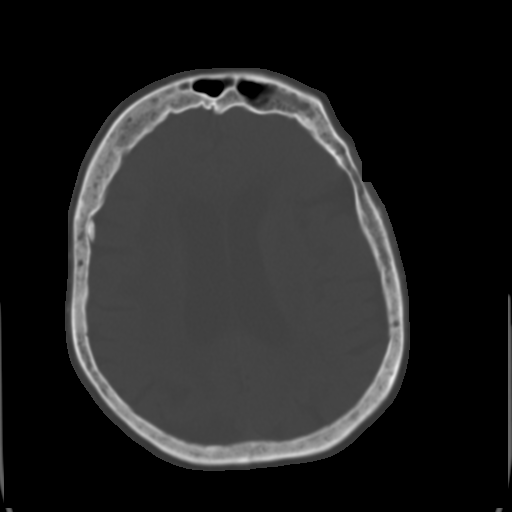
[im 23/31  brain]
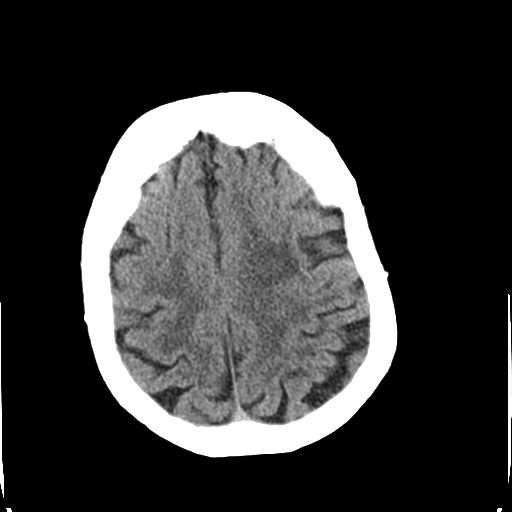
[im 27/31  brain]
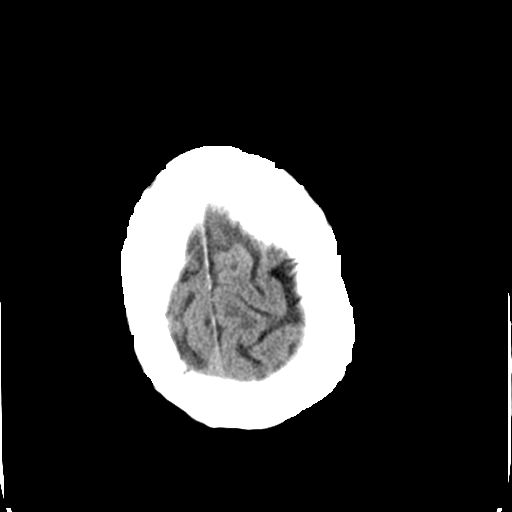

[Series 3: head bone · axial · 0.44mm/px · z∈[+58,+88]mm · 3 of 77 slices shown]
[im 8/77  bone]
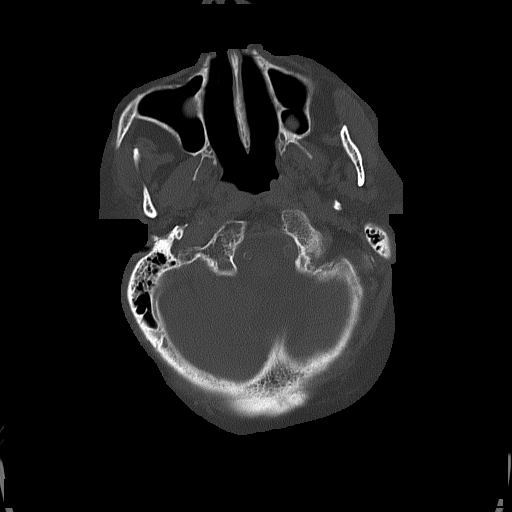
[im 16/77  bone]
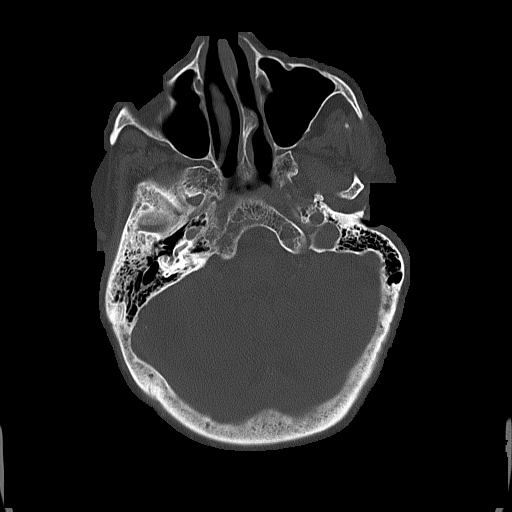
[im 23/77  bone]
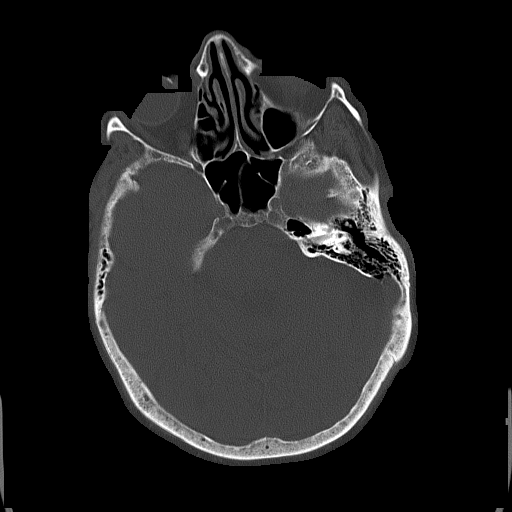

[Series 4: coronal soft tissue · coronal · 0.31mm/px · 3 of 68 slices shown]
[im 23/68  brain]
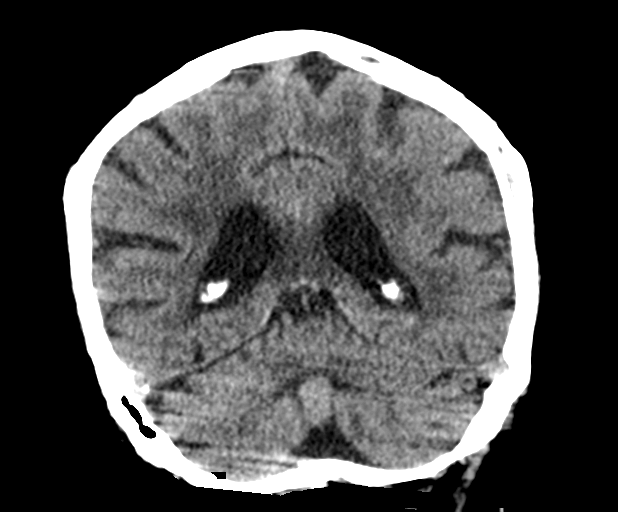
[im 30/68  brain]
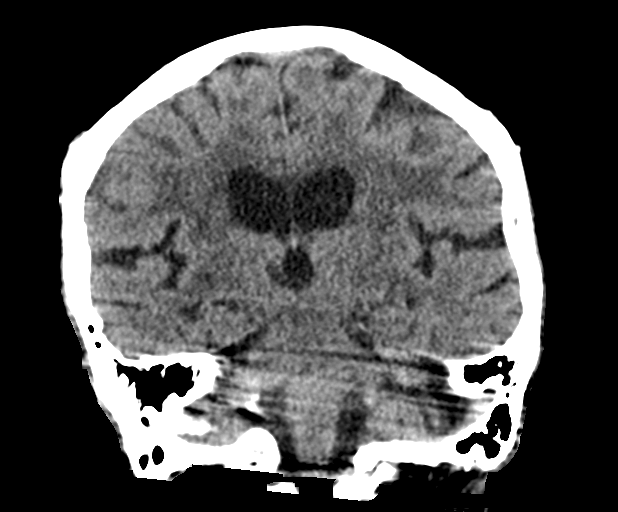
[im 38/68  brain]
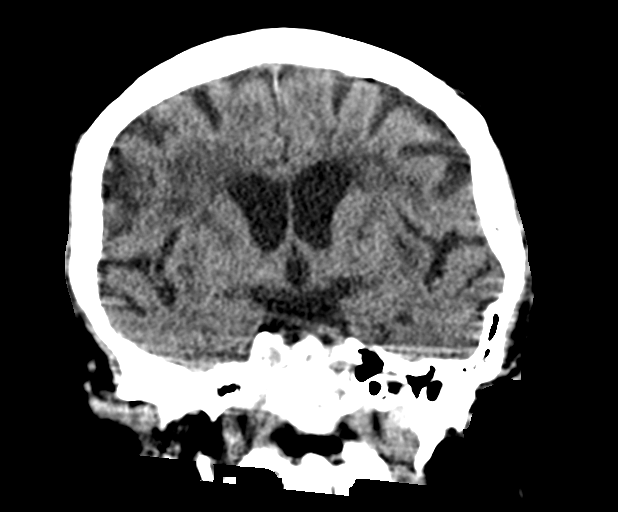

[Series 5: sagittal soft tissue · sagittal · 0.33mm/px · 3 of 57 slices shown]
[im 23/57  brain]
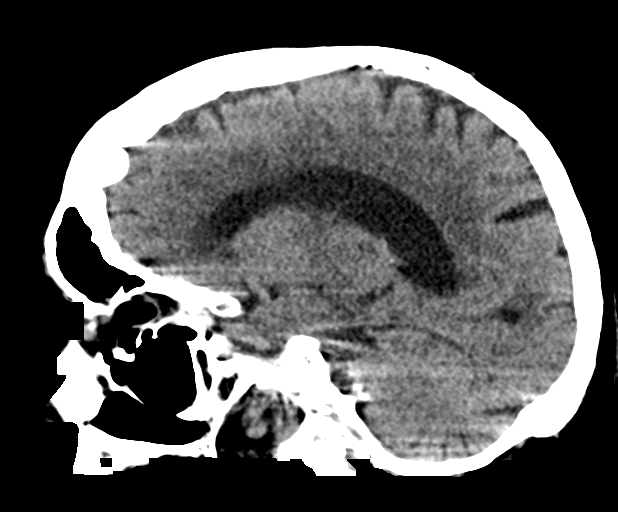
[im 29/57  brain]
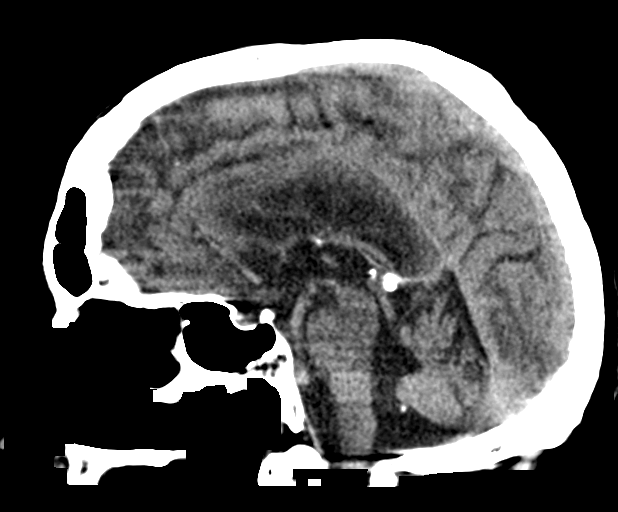
[im 34/57  brain]
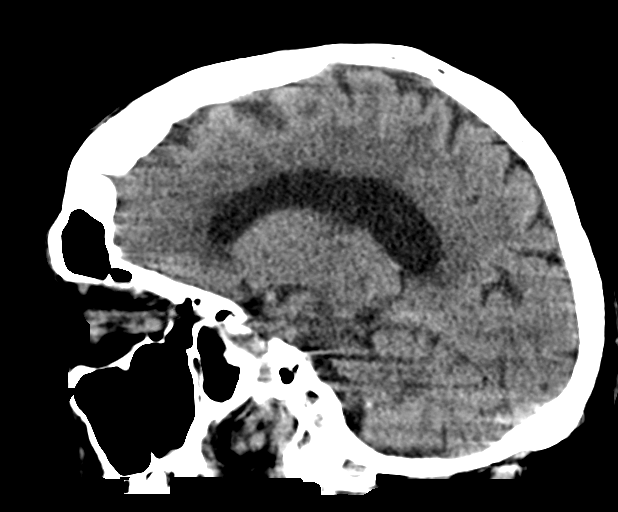

[16 of 47 positions shown; findings below may reference images not displayed]

FINDINGS: Mild motion degraded examination.

BRAIN: No intraparenchymal hemorrhage, mass effect nor midline
shift. No parenchymal brain volume loss for age. No hydrocephalus.
Confluent supratentorial white matter hypodensities. Old basal
ganglia and thalami infarcts. No acute large vascular territory
infarcts. No abnormal extra-axial fluid collections. Basal cisterns
are patent.

VASCULAR: Moderate calcific atherosclerosis of the carotid siphons.

SKULL: No skull fracture. Osteopenia. No significant scalp soft
tissue swelling.

SINUSES/ORBITS: Maxillary mucosal retention cyst. Small RIGHT
mastoid effusion.The included ocular globes and orbital contents are
non-suspicious. Status post bilateral ocular lens implants.

OTHER: None.
IMPRESSION: 1. No acute intracranial process.
2. Stable examination including severe chronic small vessel ischemic
changes and old lacunar infarcts.

## 2020-05-03 ENCOUNTER — Ambulatory Visit: Payer: Medicare Other | Admitting: Cardiovascular Disease

## 2020-05-09 ENCOUNTER — Encounter (HOSPITAL_COMMUNITY): Payer: Self-pay | Admitting: *Deleted

## 2020-05-09 ENCOUNTER — Inpatient Hospital Stay (HOSPITAL_COMMUNITY)
Admission: EM | Admit: 2020-05-09 | Discharge: 2020-05-11 | DRG: 177 | Disposition: A | Discharge status: Home Health | Payer: Medicare Other | Attending: Internal Medicine | Admitting: Internal Medicine

## 2020-05-09 ENCOUNTER — Emergency Department (HOSPITAL_COMMUNITY): Payer: Medicare Other

## 2020-05-09 ENCOUNTER — Other Ambulatory Visit: Payer: Self-pay

## 2020-05-09 DIAGNOSIS — Z7984 Long term (current) use of oral hypoglycemic drugs: Secondary | ICD-10-CM

## 2020-05-09 DIAGNOSIS — Z8673 Personal history of transient ischemic attack (TIA), and cerebral infarction without residual deficits: Secondary | ICD-10-CM

## 2020-05-09 DIAGNOSIS — E860 Dehydration: Secondary | ICD-10-CM | POA: Diagnosis present

## 2020-05-09 DIAGNOSIS — I480 Paroxysmal atrial fibrillation: Secondary | ICD-10-CM | POA: Diagnosis present

## 2020-05-09 DIAGNOSIS — I255 Ischemic cardiomyopathy: Secondary | ICD-10-CM | POA: Diagnosis present

## 2020-05-09 DIAGNOSIS — Z90710 Acquired absence of both cervix and uterus: Secondary | ICD-10-CM

## 2020-05-09 DIAGNOSIS — Z91041 Radiographic dye allergy status: Secondary | ICD-10-CM | POA: Diagnosis not present

## 2020-05-09 DIAGNOSIS — Z955 Presence of coronary angioplasty implant and graft: Secondary | ICD-10-CM | POA: Diagnosis not present

## 2020-05-09 DIAGNOSIS — R32 Unspecified urinary incontinence: Secondary | ICD-10-CM | POA: Diagnosis present

## 2020-05-09 DIAGNOSIS — E785 Hyperlipidemia, unspecified: Secondary | ICD-10-CM | POA: Diagnosis present

## 2020-05-09 DIAGNOSIS — I10 Essential (primary) hypertension: Secondary | ICD-10-CM | POA: Diagnosis present

## 2020-05-09 DIAGNOSIS — Z87891 Personal history of nicotine dependence: Secondary | ICD-10-CM

## 2020-05-09 DIAGNOSIS — K219 Gastro-esophageal reflux disease without esophagitis: Secondary | ICD-10-CM | POA: Diagnosis present

## 2020-05-09 DIAGNOSIS — E1159 Type 2 diabetes mellitus with other circulatory complications: Secondary | ICD-10-CM | POA: Diagnosis not present

## 2020-05-09 DIAGNOSIS — I5022 Chronic systolic (congestive) heart failure: Secondary | ICD-10-CM | POA: Diagnosis present

## 2020-05-09 DIAGNOSIS — I2510 Atherosclerotic heart disease of native coronary artery without angina pectoris: Secondary | ICD-10-CM | POA: Diagnosis present

## 2020-05-09 DIAGNOSIS — U071 COVID-19: Principal | ICD-10-CM | POA: Diagnosis present

## 2020-05-09 DIAGNOSIS — E119 Type 2 diabetes mellitus without complications: Secondary | ICD-10-CM

## 2020-05-09 DIAGNOSIS — E871 Hypo-osmolality and hyponatremia: Secondary | ICD-10-CM | POA: Diagnosis present

## 2020-05-09 DIAGNOSIS — Z7901 Long term (current) use of anticoagulants: Secondary | ICD-10-CM

## 2020-05-09 DIAGNOSIS — E1165 Type 2 diabetes mellitus with hyperglycemia: Secondary | ICD-10-CM | POA: Diagnosis present

## 2020-05-09 DIAGNOSIS — Z79899 Other long term (current) drug therapy: Secondary | ICD-10-CM

## 2020-05-09 DIAGNOSIS — Z7982 Long term (current) use of aspirin: Secondary | ICD-10-CM | POA: Diagnosis not present

## 2020-05-09 DIAGNOSIS — I252 Old myocardial infarction: Secondary | ICD-10-CM | POA: Diagnosis not present

## 2020-05-09 DIAGNOSIS — F0390 Unspecified dementia without behavioral disturbance: Secondary | ICD-10-CM | POA: Diagnosis present

## 2020-05-09 DIAGNOSIS — T380X5A Adverse effect of glucocorticoids and synthetic analogues, initial encounter: Secondary | ICD-10-CM | POA: Diagnosis present

## 2020-05-09 DIAGNOSIS — G9340 Encephalopathy, unspecified: Secondary | ICD-10-CM | POA: Diagnosis not present

## 2020-05-09 DIAGNOSIS — J1282 Pneumonia due to coronavirus disease 2019: Secondary | ICD-10-CM | POA: Diagnosis present

## 2020-05-09 DIAGNOSIS — G9341 Metabolic encephalopathy: Secondary | ICD-10-CM | POA: Diagnosis present

## 2020-05-09 DIAGNOSIS — I110 Hypertensive heart disease with heart failure: Secondary | ICD-10-CM | POA: Diagnosis present

## 2020-05-09 DIAGNOSIS — J9601 Acute respiratory failure with hypoxia: Secondary | ICD-10-CM | POA: Diagnosis present

## 2020-05-09 DIAGNOSIS — I350 Nonrheumatic aortic (valve) stenosis: Secondary | ICD-10-CM | POA: Diagnosis present

## 2020-05-09 DIAGNOSIS — K760 Fatty (change of) liver, not elsewhere classified: Secondary | ICD-10-CM | POA: Diagnosis present

## 2020-05-09 LAB — COMPREHENSIVE METABOLIC PANEL
ALT: 13 U/L (ref 0–44)
AST: 16 U/L (ref 15–41)
Albumin: 3.6 g/dL (ref 3.5–5.0)
Alkaline Phosphatase: 93 U/L (ref 38–126)
Anion gap: 9 (ref 5–15)
BUN: 19 mg/dL (ref 8–23)
CO2: 25 mmol/L (ref 22–32)
Calcium: 8.9 mg/dL (ref 8.9–10.3)
Chloride: 99 mmol/L (ref 98–111)
Creatinine, Ser: 0.7 mg/dL (ref 0.44–1.00)
GFR, Estimated: >60 mL/min (ref >60)
Glucose, Bld: 181 mg/dL — ABNORMAL HIGH (ref 70–99)
Potassium: 3.7 mmol/L (ref 3.5–5.1)
Sodium: 133 mmol/L — ABNORMAL LOW (ref 135–145)
Total Bilirubin: 0.4 mg/dL (ref 0.3–1.2)
Total Protein: 6.8 g/dL (ref 6.5–8.1)

## 2020-05-09 LAB — CBC WITH DIFFERENTIAL/PLATELET
Abs Immature Granulocytes: 0.03 10*3/uL (ref 0.00–0.07)
Basophils Absolute: 0 10*3/uL (ref 0.0–0.1)
Basophils Relative: 0 %
Eosinophils Absolute: 0.1 10*3/uL (ref 0.0–0.5)
Eosinophils Relative: 1 %
HCT: 35.9 % — ABNORMAL LOW (ref 36.0–46.0)
Hemoglobin: 11.8 g/dL — ABNORMAL LOW (ref 12.0–15.0)
Immature Granulocytes: 0 %
Lymphocytes Relative: 17 %
Lymphs Abs: 1.4 10*3/uL (ref 0.7–4.0)
MCH: 31.2 pg (ref 26.0–34.0)
MCHC: 32.9 g/dL (ref 30.0–36.0)
MCV: 95 fL (ref 80.0–100.0)
Monocytes Absolute: 1.2 10*3/uL — ABNORMAL HIGH (ref 0.1–1.0)
Monocytes Relative: 14 %
Neutro Abs: 5.4 10*3/uL (ref 1.7–7.7)
Neutrophils Relative %: 68 %
Platelets: 163 10*3/uL (ref 150–400)
RBC: 3.78 MIL/uL — ABNORMAL LOW (ref 3.87–5.11)
RDW: 12.3 % (ref 11.5–15.5)
WBC: 8.1 10*3/uL (ref 4.0–10.5)
nRBC: 0 % (ref 0.0–0.2)

## 2020-05-09 LAB — URINALYSIS, ROUTINE W REFLEX MICROSCOPIC
Bilirubin Urine: NEGATIVE
Glucose, UA: 50 mg/dL — AB
Hgb urine dipstick: NEGATIVE
Ketones, ur: 5 mg/dL — AB
Leukocytes,Ua: NEGATIVE
Nitrite: NEGATIVE
Protein, ur: 30 mg/dL — AB
Specific Gravity, Urine: 1.021 (ref 1.005–1.030)
pH: 5 (ref 5.0–8.0)

## 2020-05-09 LAB — D-DIMER, QUANTITATIVE: D-Dimer, Quant: 0.61 ug/mL-FEU — ABNORMAL HIGH (ref 0.00–0.50)

## 2020-05-09 LAB — RESP PANEL BY RT-PCR (FLU A&B, COVID) ARPGX2
Influenza A by PCR: NEGATIVE
Influenza B by PCR: NEGATIVE
SARS Coronavirus 2 by RT PCR: POSITIVE — AB

## 2020-05-09 LAB — LACTATE DEHYDROGENASE: LDH: 114 U/L (ref 98–192)

## 2020-05-09 LAB — FIBRINOGEN: Fibrinogen: 739 mg/dL — ABNORMAL HIGH (ref 210–475)

## 2020-05-09 LAB — LACTIC ACID, PLASMA: Lactic Acid, Venous: 1.4 mmol/L (ref 0.5–1.9)

## 2020-05-09 LAB — PROCALCITONIN: Procalcitonin: 0.11 ng/mL

## 2020-05-09 LAB — GLUCOSE, CAPILLARY: Glucose-Capillary: 250 mg/dL — ABNORMAL HIGH (ref 70–99)

## 2020-05-09 LAB — C-REACTIVE PROTEIN: CRP: 16.4 mg/dL — ABNORMAL HIGH (ref <1.0)

## 2020-05-09 LAB — FERRITIN: Ferritin: 88 ng/mL (ref 11–307)

## 2020-05-09 LAB — TRIGLYCERIDES: Triglycerides: 128 mg/dL (ref <150)

## 2020-05-09 MED ORDER — INSULIN DETEMIR 100 UNIT/ML ~~LOC~~ SOLN
0.0750 [IU]/kg | Freq: Two times a day (BID) | SUBCUTANEOUS | Status: DC
  Administered 2020-05-09 – 2020-05-10 (×2): 5 [IU] via SUBCUTANEOUS
  Filled 2020-05-09 (×4): qty 0.05

## 2020-05-09 MED ORDER — ALBUTEROL SULFATE HFA 108 (90 BASE) MCG/ACT IN AERS
INHALATION_SPRAY | RESPIRATORY_TRACT | Status: AC
  Filled 2020-05-09: qty 6.7

## 2020-05-09 MED ORDER — LOSARTAN POTASSIUM 25 MG PO TABS
100.0000 mg | ORAL_TABLET | Freq: Every morning | ORAL | Status: DC
  Administered 2020-05-10: 100 mg via ORAL
  Filled 2020-05-09: qty 4

## 2020-05-09 MED ORDER — PHENAZOPYRIDINE HCL 100 MG PO TABS
95.0000 mg | ORAL_TABLET | Freq: Three times a day (TID) | ORAL | Status: DC
  Administered 2020-05-10 – 2020-05-11 (×5): 100 mg via ORAL
  Filled 2020-05-09 (×5): qty 1

## 2020-05-09 MED ORDER — ACETAMINOPHEN 325 MG PO TABS
650.0000 mg | ORAL_TABLET | Freq: Once | ORAL | Status: AC
  Administered 2020-05-09: 650 mg via ORAL
  Filled 2020-05-09: qty 2

## 2020-05-09 MED ORDER — SODIUM CHLORIDE 0.9 % IV SOLN
1.0000 g | INTRAVENOUS | Status: DC
  Administered 2020-05-10: 1 g via INTRAVENOUS
  Filled 2020-05-09: qty 10

## 2020-05-09 MED ORDER — ACETAMINOPHEN 325 MG PO TABS: 650.0000 mg | ORAL_TABLET | Freq: Four times a day (QID) | ORAL | Status: DC | PRN

## 2020-05-09 MED ORDER — ONDANSETRON HCL 4 MG PO TABS: 4.0000 mg | ORAL_TABLET | Freq: Four times a day (QID) | ORAL | Status: DC | PRN

## 2020-05-09 MED ORDER — ZINC SULFATE 220 (50 ZN) MG PO CAPS
220.0000 mg | ORAL_CAPSULE | Freq: Every day | ORAL | Status: DC
  Administered 2020-05-10 – 2020-05-11 (×2): 220 mg via ORAL
  Filled 2020-05-09 (×2): qty 1

## 2020-05-09 MED ORDER — METHYLPREDNISOLONE SODIUM SUCC 125 MG IJ SOLR
125.0000 mg | Freq: Once | INTRAMUSCULAR | Status: AC
  Administered 2020-05-09: 125 mg via INTRAVENOUS
  Filled 2020-05-09: qty 2

## 2020-05-09 MED ORDER — POLYVINYL ALCOHOL 1.4 % OP SOLN: 1.0000 [drp] | Freq: Two times a day (BID) | OPHTHALMIC | Status: DC | PRN

## 2020-05-09 MED ORDER — GUAIFENESIN-DM 100-10 MG/5ML PO SYRP: 10.0000 mL | ORAL_SOLUTION | ORAL | Status: DC | PRN

## 2020-05-09 MED ORDER — DEXAMETHASONE 4 MG PO TABS
6.0000 mg | ORAL_TABLET | ORAL | Status: DC
  Administered 2020-05-09 – 2020-05-10 (×2): 6 mg via ORAL
  Filled 2020-05-09 (×2): qty 2

## 2020-05-09 MED ORDER — ONDANSETRON HCL 4 MG/2ML IJ SOLN: 4.0000 mg | Freq: Four times a day (QID) | INTRAMUSCULAR | Status: DC | PRN

## 2020-05-09 MED ORDER — ASPIRIN EC 81 MG PO TBEC
81.0000 mg | DELAYED_RELEASE_TABLET | Freq: Every day | ORAL | Status: DC
  Administered 2020-05-10 – 2020-05-11 (×2): 81 mg via ORAL
  Filled 2020-05-09 (×2): qty 1

## 2020-05-09 MED ORDER — AMLODIPINE BESYLATE 5 MG PO TABS
10.0000 mg | ORAL_TABLET | Freq: Every evening | ORAL | Status: DC
  Administered 2020-05-09: 10 mg via ORAL
  Filled 2020-05-09: qty 2

## 2020-05-09 MED ORDER — METOPROLOL SUCCINATE ER 25 MG PO TB24: 100.0000 mg | ORAL_TABLET | Freq: Every day | ORAL | Status: DC

## 2020-05-09 MED ORDER — APIXABAN 5 MG PO TABS
5.0000 mg | ORAL_TABLET | Freq: Two times a day (BID) | ORAL | Status: DC
  Administered 2020-05-09 – 2020-05-11 (×4): 5 mg via ORAL
  Filled 2020-05-09 (×4): qty 1

## 2020-05-09 MED ORDER — INSULIN ASPART 100 UNIT/ML ~~LOC~~ SOLN
0.0000 [IU] | Freq: Every day | SUBCUTANEOUS | Status: DC
  Administered 2020-05-09: 2 [IU] via SUBCUTANEOUS

## 2020-05-09 MED ORDER — LINAGLIPTIN 5 MG PO TABS
5.0000 mg | ORAL_TABLET | Freq: Every day | ORAL | Status: DC
  Administered 2020-05-10 – 2020-05-11 (×2): 5 mg via ORAL
  Filled 2020-05-09 (×2): qty 1

## 2020-05-09 MED ORDER — LACTATED RINGERS IV BOLUS
250.0000 mL | Freq: Once | INTRAVENOUS | Status: AC
  Administered 2020-05-09: 250 mL via INTRAVENOUS

## 2020-05-09 MED ORDER — SODIUM CHLORIDE 0.9 % IV SOLN
100.0000 mg | Freq: Every day | INTRAVENOUS | Status: DC
  Administered 2020-05-10 – 2020-05-11 (×2): 100 mg via INTRAVENOUS
  Filled 2020-05-09 (×2): qty 20

## 2020-05-09 MED ORDER — ROSUVASTATIN CALCIUM 20 MG PO TABS
20.0000 mg | ORAL_TABLET | Freq: Every evening | ORAL | Status: DC
  Administered 2020-05-10: 20 mg via ORAL
  Filled 2020-05-09: qty 1

## 2020-05-09 MED ORDER — LORATADINE 10 MG PO TABS
10.0000 mg | ORAL_TABLET | Freq: Every day | ORAL | Status: DC
  Administered 2020-05-10 – 2020-05-11 (×2): 10 mg via ORAL
  Filled 2020-05-09 (×2): qty 1

## 2020-05-09 MED ORDER — HYDROCOD POLST-CPM POLST ER 10-8 MG/5ML PO SUER: 5.0000 mL | Freq: Two times a day (BID) | ORAL | Status: DC | PRN

## 2020-05-09 MED ORDER — ALBUTEROL SULFATE HFA 108 (90 BASE) MCG/ACT IN AERS
2.0000 | INHALATION_SPRAY | Freq: Four times a day (QID) | RESPIRATORY_TRACT | Status: DC
  Administered 2020-05-10 (×4): 2 via RESPIRATORY_TRACT

## 2020-05-09 MED ORDER — ASCORBIC ACID 500 MG PO TABS
500.0000 mg | ORAL_TABLET | Freq: Every day | ORAL | Status: DC
  Administered 2020-05-10 – 2020-05-11 (×2): 500 mg via ORAL
  Filled 2020-05-09 (×2): qty 1

## 2020-05-09 MED ORDER — PANTOPRAZOLE SODIUM 40 MG PO TBEC
40.0000 mg | DELAYED_RELEASE_TABLET | Freq: Every day | ORAL | Status: DC
  Administered 2020-05-10 – 2020-05-11 (×2): 40 mg via ORAL
  Filled 2020-05-09 (×2): qty 1

## 2020-05-09 MED ORDER — SODIUM CHLORIDE 0.9 % IV SOLN
100.0000 mg | INTRAVENOUS | Status: AC
  Administered 2020-05-09 (×2): 100 mg via INTRAVENOUS
  Filled 2020-05-09 (×2): qty 20

## 2020-05-09 MED ORDER — URSODIOL 300 MG PO CAPS
300.0000 mg | ORAL_CAPSULE | Freq: Two times a day (BID) | ORAL | Status: DC
  Administered 2020-05-09 – 2020-05-11 (×4): 300 mg via ORAL
  Filled 2020-05-09 (×7): qty 1

## 2020-05-09 MED ORDER — INSULIN ASPART 100 UNIT/ML ~~LOC~~ SOLN
0.0000 [IU] | Freq: Three times a day (TID) | SUBCUTANEOUS | Status: DC
  Administered 2020-05-10: 5 [IU] via SUBCUTANEOUS

## 2020-05-09 NOTE — ED Notes (Signed)
Pt placed on 2LNC sats at 94%

## 2020-05-09 NOTE — ED Provider Notes (Signed)
[facility] EMERGENCY DEPARTMENT Provider Note   CSN: [id number] Arrival date & time: 05/09/20  1133     History Chief Complaint  Patient presents with  . Altered Mental Status    [name] is a 86 y.o. female brought to the ED by EMS from home for altered mental status.  Per triage note family reported to EMS that patient was talking out of her head last night and pacing around and "peeing on everything".  Family concerned about a UTI.  Patient is able to tell me her name but disoriented otherwise.  Tells me she is feeling sick.  When asked how long she says I do not know.  Reports coughing once in a while, wet cough.  Does not think she has had a fever.  Has associated nose congestion.  Denies any pain.  Denies sore throat, chest pain, shortness of breath.  No nausea, vomiting, abdominal pain, diarrhea or constipation.  Cannot remember when her last BM was.  Denies dysuria, odor to her urine, frequency.  States "I just do not feel good".  Denies sick contacts.  No travel.  States she got the Covid and flu vaccines.  Has been drinking/eating okay.  States she lives with her mom and dad.  Cannot tell me their names.  Will attempt to contact family for further history. Unknown interventions, modifying factors.   HPI     Past Medical History:  Diagnosis Date  . Allergic rhinitis   . Allergy to IVP dye   . Arthritis    "probably in my hands" (02/26/2018)  . Colonic polyp   . Coronary artery disease    a. cath 02/2018 PCI with DES x1 to the distal RCA and DES x1 to the proximal LAD. b. NSTEMI 09/2018 s/p DES to dRCA and DES to prox LAD just beyond stented segments, residual moderate OM2 disease for medical therapy.  . CVA (cerebral vascular accident) ([place])   . Dementia ([place])   . GERD (gastroesophageal reflux disease)   . History of blood transfusion    "don't remember why or when" (02/26/2018)  . Hyperlipidemia   . Hypertension   . Ischemic cardiomyopathy    a. EF 40-45% by echo  09/2018.  . Moderate aortic stenosis   . Moderate mitral regurgitation   . Nonalcoholic hepatosteatosis   . PAF (paroxysmal atrial fibrillation) ([place])   . TIA (transient ischemic attack) 2000s   "I've had ~ 3" (02/26/2018)  . Type II diabetes mellitus [facility])     Patient Active Problem List   Diagnosis Date Noted  . Pneumonia due to COVID-19 virus 05/09/2020  . Encephalopathy 05/09/2020  . History of stroke 09/06/2018  . PAF (paroxysmal atrial fibrillation) ([place]) 09/06/2018  . Moderate aortic stenosis 09/06/2018  . Moderate mitral regurgitation 09/06/2018  . Disorientation 09/06/2018  . Ischemic cardiomyopathy   . NSTEMI (non-ST elevated myocardial infarction) ([place])   . Arm pain, anterior, left 09/03/2018  . Angina at rest [facility]) 09/03/2018  . Abnormal EKG   . Elevated troponin   . Acute left-sided weakness 05/07/2018  . Nonalcoholic hepatosteatosis 29/19/1660  . Acute CVA (cerebrovascular accident) ([place]) 05/07/2018  . Weakness 05/07/2018  . PFO (patent foramen ovale) 02/07/2011  . Type II diabetes mellitus ([place]) 09/30/2008  . CHANGE IN BOWELS 09/30/2008  . Hyperlipidemia 09/29/2008  . Essential hypertension 09/29/2008  . Coronary atherosclerosis 09/29/2008  . VENTRICULAR HYPERTROPHY, LEFT 09/29/2008  . ALLERGIC RHINITIS, CHRONIC 09/29/2008  . OTHER CHRONIC NONALCOHOLIC LIVER DISEASE 60/09/5995  .  LIVER FUNCTION TESTS, ABNORMAL, HX OF 09/29/2008  . COLONIC POLYPS, HX OF 09/29/2008  . GERD (gastroesophageal reflux disease) 09/29/2008    Past Surgical History:  Procedure Laterality Date  . ABDOMINAL HYSTERECTOMY    . APPENDECTOMY  1950s  . CARDIAC CATHETERIZATION  2001   Nonobstructive CAD  . CATARACT EXTRACTION W/ INTRAOCULAR LENS  IMPLANT, BILATERAL Bilateral 2000s  . CORONARY STENT INTERVENTION N/A 02/26/2018   Procedure: CORONARY STENT INTERVENTION;  Surgeon: Martinique, Peter M, MD;  Location: [place] CV LAB;  Service: Cardiovascular;  Laterality: N/A;  rca   .  CORONARY STENT INTERVENTION N/A 09/05/2018   Procedure: CORONARY STENT INTERVENTION;  Surgeon: Burnell Blanks, MD;  Location: [place] CV LAB;  Service: Cardiovascular;  Laterality: N/A;  . LEFT HEART CATH AND CORONARY ANGIOGRAPHY N/A 02/26/2018   Procedure: LEFT HEART CATH AND CORONARY ANGIOGRAPHY;  Surgeon: Martinique, Peter M, MD;  Location: [place] CV LAB;  Service: Cardiovascular;  Laterality: N/A;  . LEFT HEART CATH AND CORONARY ANGIOGRAPHY N/A 09/05/2018   Procedure: LEFT HEART CATH AND CORONARY ANGIOGRAPHY;  Surgeon: Burnell Blanks, MD;  Location: [place] CV LAB;  Service: Cardiovascular;  Laterality: N/A;  . TONSILLECTOMY  1943     OB History   No obstetric history on file.     Family History  Problem Relation Age of Onset  . Heart failure Mother   . Prostate cancer Father   . Prostate cancer Brother     Social History   Tobacco Use  . Smoking status: Former Smoker    Packs/day: 2.00    Years: 40.00    Pack years: 80.00    Types: Cigarettes    Quit date: 06/05/1992    Years since quitting: 27.9  . Smokeless tobacco: Former Systems developer    Types: Chew    Quit date: 12/03/1992  . Tobacco comment: used tobacco to help quit smoking  Vaping Use  . Vaping Use: Never used  Substance Use Topics  . Alcohol use: Never  . Drug use: Never    Home Medications Prior to Admission medications   Medication Sig Start Date End Date Taking? Authorizing Provider  acetaminophen (TYLENOL) 500 MG tablet Take 500 mg by mouth every 6 (six) hours as needed.   Yes [provider]  amLODipine (NORVASC) 10 MG tablet Take 10 mg by mouth every evening.   Yes [provider]  aspirin EC 81 MG tablet Take 81 mg by mouth daily.   Yes [provider]  carboxymethylcellulose (REFRESH) 1 % ophthalmic solution Place 1 drop into both eyes 2 (two) times daily as needed (dry eyes).    Yes [provider]  ELIQUIS 5 MG TABS tablet Take 5 mg by mouth 2 (two)  times daily.  08/12/18  Yes [provider]  fexofenadine (ALLEGRA) 180 MG tablet Take 180 mg by mouth daily.     Yes [provider]  losartan (COZAAR) 100 MG tablet Take 100 mg by mouth in the morning.  10/07/18  Yes [provider]  metFORMIN (GLUCOPHAGE-XR) 500 MG 24 hr tablet Take 1 tablet (500 mg total) by mouth 2 (two) times daily with a meal. 05/10/18  Yes Johnson, Clanford L, MD  metoprolol succinate (TOPROL-XL) 100 MG 24 hr tablet Take 100 mg by mouth in the morning and at bedtime. Take with or immediately following a meal.    Yes [provider]  nitroGLYCERIN (NITROSTAT) 0.4 MG SL tablet Place 0.4 mg under the tongue  every 5 (five) minutes as needed for chest pain.   Yes de Stanford Scotland, MD  pantoprazole (PROTONIX) 40 MG tablet Take 1 tablet (40 mg total) by mouth daily. 04/02/18  Yes Strader, Fransisco Hertz, PA-C  phenazopyridine (PYRIDIUM) 95 MG tablet Take 95 mg by mouth 3 (three) times daily as needed for pain.   Yes [provider]  rosuvastatin (CRESTOR) 20 MG tablet Take 1 tablet (20 mg total) by mouth every evening. 09/06/18  Yes Herminio Commons, MD  ursodiol (ACTIGALL) 300 MG capsule Take 300 mg by mouth 2 (two) times daily.    Yes [provider]    Allergies    Ivp dye [iodinated diagnostic agents], Sulfonamide derivatives, Lipitor [atorvastatin calcium], Zetia [ezetimibe], and Penicillins  Review of Systems   Review of Systems  Constitutional:       Malaise   HENT: Positive for congestion.   Respiratory: Positive for cough.   Hematological: Bruises/bleeds easily.  All other systems reviewed and are negative.   Physical Exam Updated Vital Signs BP 117/61 (BP Location: Left Arm)   Pulse 82   Temp 98.1 F (36.7 C)   Resp 18   Ht 5' 4"  (1.626 m)   Wt 70.8 kg   SpO2 97%   BMI 26.79 kg/m   Physical Exam Vitals and nursing note reviewed.  Constitutional:      Appearance: She is well-developed.     Comments: Non  toxic in NAD. Appears tired.  Slow to respond, HOH.  HENT:     Head: Normocephalic and atraumatic.     Nose: Rhinorrhea present.     Mouth/Throat:     Comments: Lips dry, MM tacky.  Eyes:     Conjunctiva/sclera: Conjunctivae normal.  Cardiovascular:     Rate and Rhythm: Normal rate and regular rhythm.     Comments: No lower extremity edema.  1+ DP pulses. Pulmonary:     Effort: Pulmonary effort is normal.     Breath sounds: Normal breath sounds.     Comments: SPO2 91%-95% at rest while talking.  Diminished air sounds bibasilar, worse in the left side.  No wheezing, crackles.  Speaking in short sentences.  No increased work of breathing at rest.  SPO2 dropped to 84% with patient sitting at the bed side. Abdominal:     General: Bowel sounds are normal.     Palpations: Abdomen is soft.     Tenderness: There is no abdominal tenderness.     Comments: No G/R/R. No suprapubic or CVA tenderness. Negative Murphy's and McBurney's. Active BS to lower quadrants.   Musculoskeletal:        General: Normal range of motion.     Cervical back: Normal range of motion.  Skin:    General: Skin is warm and dry.     Capillary Refill: Capillary refill takes less than 2 seconds.  Neurological:     Mental Status: She is alert. She is disoriented.     Comments: Sensation to light touch in face, upper and lower extremities intact.  Symmetric strength in upper and lower extremities.  No facial droop.  No signs of neglect.  Appears tired, slow to respond.  Hard of hearing.  Oriented to full name only.  Psychiatric:        Behavior: Behavior normal.     ED Results / Procedures / Treatments   Labs (all labs ordered are listed, but only abnormal results are displayed) Labs Reviewed  RESP PANEL BY RT-PCR (FLU  A&B, COVID) ARPGX2 - Abnormal; Notable for the following components:      Result Value   SARS Coronavirus 2 by RT PCR POSITIVE (*)    All other components within normal limits  URINALYSIS, ROUTINE W  REFLEX MICROSCOPIC - Abnormal; Notable for the following components:   Glucose, UA 50 (*)    Ketones, ur 5 (*)    Protein, ur 30 (*)    Bacteria, UA FEW (*)    All other components within normal limits  CBC WITH DIFFERENTIAL/PLATELET - Abnormal; Notable for the following components:   RBC 3.78 (*)    Hemoglobin 11.8 (*)    HCT 35.9 (*)    Monocytes Absolute 1.2 (*)    All other components within normal limits  COMPREHENSIVE METABOLIC PANEL - Abnormal; Notable for the following components:   Sodium 133 (*)    Glucose, Bld 181 (*)    All other components within normal limits  D-DIMER, QUANTITATIVE (NOT AT [facility]) - Abnormal; Notable for the following components:   D-Dimer, Quant 0.61 (*)    All other components within normal limits  FIBRINOGEN - Abnormal; Notable for the following components:   Fibrinogen 739 (*)    All other components within normal limits  C-REACTIVE PROTEIN - Abnormal; Notable for the following components:   CRP 16.4 (*)    All other components within normal limits  GLUCOSE, CAPILLARY - Abnormal; Notable for the following components:   Glucose-Capillary 250 (*)    All other components within normal limits  CULTURE, BLOOD (ROUTINE X 2)  CULTURE, BLOOD (ROUTINE X 2)  URINE CULTURE  LACTIC ACID, PLASMA  PROCALCITONIN  LACTATE DEHYDROGENASE  FERRITIN  TRIGLYCERIDES  FERRITIN  MAGNESIUM  D-DIMER, QUANTITATIVE (NOT AT [facility])  C-REACTIVE PROTEIN  CBC WITH DIFFERENTIAL/PLATELET    EKG None  Radiology CT Head Wo Contrast  Result Date: 05/09/2020 CLINICAL DATA:  Altered mental status EXAM: CT HEAD WITHOUT CONTRAST CT CERVICAL SPINE WITHOUT CONTRAST TECHNIQUE: Multidetector CT imaging of the head and cervical spine was performed following the standard protocol without intravenous contrast. Multiplanar CT image reconstructions of the cervical spine were also generated. COMPARISON:  11/05/2019 FINDINGS: CT HEAD FINDINGS Brain: No evidence of acute infarction,  hemorrhage, hydrocephalus, extra-axial collection or mass lesion/mass effect. Chronic atrophic and ischemic changes are noted. Vascular: No hyperdense vessel or unexpected calcification. Skull: Normal. Negative for fracture or focal lesion. Sinuses/Orbits: Mild mucosal thickening is noted within the right maxillary antrum. Other: None. CT CERVICAL SPINE FINDINGS Alignment: Within normal limits. Skull base and vertebrae: 7 cervical segments are well visualized. Vertebral body height is well maintained. Multilevel facet hypertrophic changes and osteophytic changes are noted. No anterolisthesis is seen. No compression deformity is noted. No acute facet abnormality is seen. Soft tissues and spinal canal: Surrounding soft tissue structures are within normal limits. Upper chest: Visualized lung apices are within normal limits. Other: None IMPRESSION: CT of the head: Chronic atrophic and ischemic changes without acute abnormality. Mild mucosal thickening in the right maxillary antrum. CT of the cervical spine: Degenerative change without acute abnormality. Electronically Signed   By: Inez Catalina M.D.   On: 05/09/2020 14:38   CT Cervical Spine Wo Contrast  Result Date: 05/09/2020 CLINICAL DATA:  Altered mental status EXAM: CT HEAD WITHOUT CONTRAST CT CERVICAL SPINE WITHOUT CONTRAST TECHNIQUE: Multidetector CT imaging of the head and cervical spine was performed following the standard protocol without intravenous contrast. Multiplanar CT image reconstructions of the cervical spine were also generated. COMPARISON:  11/05/2019 FINDINGS: CT HEAD FINDINGS Brain: No evidence of acute infarction, hemorrhage, hydrocephalus, extra-axial collection or mass lesion/mass effect. Chronic atrophic and ischemic changes are noted. Vascular: No hyperdense vessel or unexpected calcification. Skull: Normal. Negative for fracture or focal lesion. Sinuses/Orbits: Mild mucosal thickening is noted within the right maxillary antrum. Other:  None. CT CERVICAL SPINE FINDINGS Alignment: Within normal limits. Skull base and vertebrae: 7 cervical segments are well visualized. Vertebral body height is well maintained. Multilevel facet hypertrophic changes and osteophytic changes are noted. No anterolisthesis is seen. No compression deformity is noted. No acute facet abnormality is seen. Soft tissues and spinal canal: Surrounding soft tissue structures are within normal limits. Upper chest: Visualized lung apices are within normal limits. Other: None IMPRESSION: CT of the head: Chronic atrophic and ischemic changes without acute abnormality. Mild mucosal thickening in the right maxillary antrum. CT of the cervical spine: Degenerative change without acute abnormality. Electronically Signed   By: Inez Catalina M.D.   On: 05/09/2020 14:38   DG Chest Portable 1 View  Result Date: 05/09/2020 CLINICAL DATA:  Altered mental status EXAM: PORTABLE CHEST 1 VIEW COMPARISON:  11/05/2019 FINDINGS: Cardiac shadow is enlarged. Aortic calcifications are noted. Lungs are well aerated bilaterally. No focal infiltrate or sizable effusion is seen. No bony abnormality is noted. IMPRESSION: No acute abnormality seen. Electronically Signed   By: Inez Catalina M.D.   On: 05/09/2020 14:39    Procedures .Critical Care Performed by: Kinnie Feil, PA-C Authorized by: Kinnie Feil, PA-C   Critical care provider statement:    Critical care time (minutes):  45   Critical care was necessary to treat or prevent imminent or life-threatening deterioration of the following conditions:  Respiratory failure and sepsis   Critical care was time spent personally by me on the following activities:  Discussions with consultants, evaluation of patient's response to treatment, examination of patient, ordering and performing treatments and interventions, ordering and review of laboratory studies, ordering and review of radiographic studies, pulse oximetry, re-evaluation of  patient's condition, obtaining history from patient or surrogate, review of old charts and development of treatment plan with patient or surrogate   I assumed direction of critical care for this patient from another provider in my specialty: no     (including critical care time)  Medications Ordered in ED Medications  remdesivir 100 mg in sodium chloride 0.9 % 100 mL IVPB (has no administration in time range)  aspirin EC tablet 81 mg (has no administration in time range)  amLODipine (NORVASC) tablet 10 mg (10 mg Oral Given 05/09/20 2242)  losartan (COZAAR) tablet 100 mg (has no administration in time range)  metoprolol succinate (TOPROL-XL) 24 hr tablet 100 mg (has no administration in time range)  rosuvastatin (CRESTOR) tablet 20 mg (has no administration in time range)  pantoprazole (PROTONIX) EC tablet 40 mg (has no administration in time range)  ursodiol (ACTIGALL) capsule 300 mg (300 mg Oral Given 05/09/20 2241)  phenazopyridine (PYRIDIUM) tablet 100 mg (has no administration in time range)  apixaban (ELIQUIS) tablet 5 mg (5 mg Oral Given 05/09/20 2242)  loratadine (CLARITIN) tablet 10 mg (has no administration in time range)  polyvinyl alcohol (LIQUIFILM TEARS) 1.4 % ophthalmic solution 1 drop (has no administration in time range)  albuterol (VENTOLIN HFA) 108 (90 Base) MCG/ACT inhaler 2 puff ( Inhalation Canceled Entry 05/09/20 2030)  dexamethasone (DECADRON) tablet 6 mg (6 mg Oral Given 05/09/20 2240)  ondansetron (ZOFRAN) tablet 4 mg (has no administration in  time range)    Or  ondansetron (ZOFRAN) injection 4 mg (has no administration in time range)  guaiFENesin-dextromethorphan (ROBITUSSIN DM) 100-10 MG/5ML syrup 10 mL (has no administration in time range)  chlorpheniramine-HYDROcodone (TUSSIONEX) 10-8 MG/5ML suspension 5 mL (has no administration in time range)  ascorbic acid (VITAMIN C) tablet 500 mg (has no administration in time range)  zinc sulfate capsule 220 mg (has no  administration in time range)  linagliptin (TRADJENTA) tablet 5 mg (has no administration in time range)  insulin detemir (LEVEMIR) injection 5 Units (5 Units Subcutaneous Given 05/09/20 2241)  insulin aspart (novoLOG) injection 0-15 Units (has no administration in time range)  insulin aspart (novoLOG) injection 0-5 Units (2 Units Subcutaneous Given 05/09/20 2243)  cefTRIAXone (ROCEPHIN) 1 g in sodium chloride 0.9 % 100 mL IVPB (has no administration in time range)  acetaminophen (TYLENOL) tablet 650 mg (has no administration in time range)  lactated ringers bolus 250 mL (0 mLs Intravenous Stopped 05/09/20 1403)  acetaminophen (TYLENOL) tablet 650 mg (650 mg Oral Given 05/09/20 1443)  methylPREDNISolone sodium succinate (SOLU-MEDROL) 125 mg/2 mL injection 125 mg (125 mg Intravenous Given 05/09/20 1513)  remdesivir 100 mg in sodium chloride 0.9 % 100 mL IVPB (0 mg Intravenous Stopped 05/09/20 1723)  albuterol (VENTOLIN HFA) 108 (90 Base) MCG/ACT inhaler (  Given 05/09/20 2026)    ED Course  I have reviewed the triage vital signs and the nursing notes.  Pertinent labs & imaging results that were available during my care of the patient were reviewed by me and considered in my medical decision making (see chart for details).  Clinical Course as of May 09 2333  Nancy Fetter May 09, 2020  1317 Hemoglobin(!): 11.8 [CG]  1317 WBC: 8.1 [CG]  1317 Hemoglobin 11.8 Low   13.8  12.6   12.8    [CG]  1318 Sodium(!): 133 [CG]  1318 Glucose(!): 181 [CG]  1318 Anion gap: 9 [CG]  1318 WBC: 8.1 [CG]  1318 Glucose, UA(!): 50 [CG]  1318 Ketones, ur(!): 5 [CG]  1318 Protein(!): 30 [CG]  1318 Bacteria, UA(!): FEW [CG]  1415 Temp(!): 100.6 F (38.1 C) [CG]  1454 SARS Coronavirus 2 by RT PCR(!): POSITIVE [CG]  1454 IMPRESSION: No acute abnormality seen.   DG Chest Portable 1 View [CG]  1454 CT Head Wo Contrast [CG]  1454 IMPRESSION: CT of the head: Chronic atrophic and ischemic changes without acute  abnormality.  Mild mucosal thickening in the right maxillary antrum.  CT of the cervical spine: Degenerative change without acute abnormality.  CT Cervical Spine Wo Contrast [CG]    Clinical Course User Index [CG] Arlean Hopping   MDM Rules/Calculators/A&P                          86 year old female brought to the ED from home for altered mental status.  Patient appears tired, somewhat confused and history/exam is limited.  EMR, triage nurse notes reviewed to obtain more history and assist with MDM.  Family called PCP over the last couple of days for urinary issues, confusion.  Reportedly patient is pacing around the house and urinating "everywhere".  Family reports similar behavior in the past with UTI.  Admitted to the hospital 6 months ago for UTI, encephalopathy.  Labs, imaging ordered as above  Patient has a rectal fever 100.6, hypoxic 85% by sitting on bedside, appears somewhat confused.  BP normal and stable.  Appears clinically dehydrated.  DDx includes  occult infection like UTI, pneumonia, Covid, flu.  She is on anticoagulants, no reported fall but ICH is a possibility.  Encephalopathy, electrolyte abnormality also considered.  ER work-up including lab work and imaging personally visualized and interpreted  Lab work reveals positive Covid, mild anemia but stable hemoglobin 11.8.  Urinalysis with few bacteria, glucosuria, ketonuria and proteinuria but no infection.  Hyperglycemia with normal anion gap.  Imaging is essentially unremarkable.  Chest x-ray without acute findings.  CT head/cervical spine unremarkable.  Patient meets sirs criteria with fever, hypoxia due to Covid illness.  No evidence of endorgan damage, hypotension.  No septic shock.  Will defer antibiotics/IV fluid boluses as this is a viral etiology.  Medicines ordered: Remdesivir, Solu-Medrol, Tylenol, small LR bolus  Patient re-evaluated and stable on [place], no clinical decline. Daughter updated.    Consults in the ED: Hospitalist, patient to be admitted by Dr. Sheran Lawless. Final Clinical Impression(s) / ED Diagnoses Final diagnoses:  COVID-19 virus infection    Rx / DC Orders ED Discharge Orders    None       Arlean Hopping 05/09/20 2334    Milton Ferguson, MD 05/10/20 [phone]

## 2020-05-09 NOTE — ED Notes (Signed)
Report called to 300

## 2020-05-09 NOTE — ED Notes (Signed)
Pt very confused.

## 2020-05-09 NOTE — H&P (Signed)
History and Physical  LATOIA EYSTER BWL:[id number] DOB: 08-25-1927 DOA: 05/09/2020  Referring physician: Carmon Sails, PA-C, ED provider PCP: Leeanne Rio, MD  Outpatient Specialists:   Patient Coming From: home  Chief Complaint: Altered mental status  HPI: JACKQUELINE AQUILAR is a 86 y.o. female with a history of coronary artery disease with ischemic cardiomyopathy and history of NSTEMI and multiple drug-eluting stents, history of stroke, dementia, GERD, hypertension, type 2 diabetes.  Patient presents with confusion, urinary incontinence, fever, cough.  Patient is an unreliable historian and history is obtained by the chart.  Her symptoms started last night and have been gradually worsening.  She was brought to the emergency department by EMS after family called EMS due to her symptoms.  No sick contacts.  Patient is unable to explain why she is here.  Emergency Department Course: Normal white count.  UA has negative leukocytes and nitrites, but few bacteria.  Covid positive  Review of Systems:  Patient unreliable  Past Medical History:  Diagnosis Date  . Allergic rhinitis   . Allergy to IVP dye   . Arthritis    "probably in my hands" (02/26/2018)  . Colonic polyp   . Coronary artery disease    a. cath 02/2018 PCI with DES x1 to the distal RCA and DES x1 to the proximal LAD. b. NSTEMI 09/2018 s/p DES to dRCA and DES to prox LAD just beyond stented segments, residual moderate OM2 disease for medical therapy.  . CVA (cerebral vascular accident) ([place])   . Dementia ([place])   . GERD (gastroesophageal reflux disease)   . History of blood transfusion    "don't remember why or when" (02/26/2018)  . Hyperlipidemia   . Hypertension   . Ischemic cardiomyopathy    a. EF 40-45% by echo 09/2018.  . Moderate aortic stenosis   . Moderate mitral regurgitation   . Nonalcoholic hepatosteatosis   . PAF (paroxysmal atrial fibrillation) ([place])   . TIA (transient ischemic attack) 2000s   "I've had ~  3" (02/26/2018)  . Type II diabetes mellitus ([place])    Past Surgical History:  Procedure Laterality Date  . ABDOMINAL HYSTERECTOMY    . APPENDECTOMY  1950s  . CARDIAC CATHETERIZATION  2001   Nonobstructive CAD  . CATARACT EXTRACTION W/ INTRAOCULAR LENS  IMPLANT, BILATERAL Bilateral 2000s  . CORONARY STENT INTERVENTION N/A 02/26/2018   Procedure: CORONARY STENT INTERVENTION;  Surgeon: Martinique, Peter M, MD;  Location: [facility];  Service: Cardiovascular;  Laterality: N/A;  rca   . CORONARY STENT INTERVENTION N/A 09/05/2018   Procedure: CORONARY STENT INTERVENTION;  Surgeon: Burnell Blanks, MD;  Location: [facility];  Service: Cardiovascular;  Laterality: N/A;  . LEFT HEART CATH AND CORONARY ANGIOGRAPHY N/A 02/26/2018   Procedure: LEFT HEART CATH AND CORONARY ANGIOGRAPHY;  Surgeon: Martinique, Peter M, MD;  Location: [facility];  Service: Cardiovascular;  Laterality: N/A;  . LEFT HEART CATH AND CORONARY ANGIOGRAPHY N/A 09/05/2018   Procedure: LEFT HEART CATH AND CORONARY ANGIOGRAPHY;  Surgeon: Burnell Blanks, MD;  Location: [facility];  Service: Cardiovascular;  Laterality: N/A;  . TONSILLECTOMY  1943   Social History:  reports that she quit smoking about 27 years ago. Her smoking use included cigarettes. She has a 80.00 pack-year smoking history. She quit smokeless tobacco use about 27 years ago.  Her smokeless tobacco use included chew. She reports that she does not drink alcohol and does not use drugs. Patient lives  at home with family members  Allergies  Allergen Reactions  . Ivp Dye [Iodinated Diagnostic Agents] Anaphylaxis  . Sulfonamide Derivatives Shortness Of Breath  . Lipitor [Atorvastatin Calcium]     Muscle Aches  . Zetia [Ezetimibe]     Muscle Aches  . Penicillins Rash    As a teenager.      Family History  Problem Relation Age of Onset  . Heart failure Mother   . Prostate cancer Father   . Prostate cancer Brother        Prior to Admission medications   Medication Sig Start Date End Date Taking? Authorizing Provider  acetaminophen (TYLENOL) 500 MG tablet Take 500 mg by mouth every 6 (six) hours as needed.   Yes [provider]  amLODipine (NORVASC) 10 MG tablet Take 10 mg by mouth every evening.   Yes [provider]  aspirin EC 81 MG tablet Take 81 mg by mouth daily.   Yes [provider]  carboxymethylcellulose (REFRESH) 1 % ophthalmic solution Place 1 drop into both eyes 2 (two) times daily as needed (dry eyes).    Yes [provider]  ELIQUIS 5 MG TABS tablet Take 5 mg by mouth 2 (two) times daily.  08/12/18  Yes [provider]  fexofenadine (ALLEGRA) 180 MG tablet Take 180 mg by mouth daily.     Yes [provider]  losartan (COZAAR) 100 MG tablet Take 100 mg by mouth in the morning.  10/07/18  Yes [provider]  metFORMIN (GLUCOPHAGE-XR) 500 MG 24 hr tablet Take 1 tablet (500 mg total) by mouth 2 (two) times daily with a meal. 05/10/18  Yes Johnson, Clanford L, MD  metoprolol succinate (TOPROL-XL) 100 MG 24 hr tablet Take 100 mg by mouth in the morning and at bedtime. Take with or immediately following a meal.    Yes [provider]  nitroGLYCERIN (NITROSTAT) 0.4 MG SL tablet Place 0.4 mg under the tongue every 5 (five) minutes as needed for chest pain.   Yes de Stanford Scotland, MD  pantoprazole (PROTONIX) 40 MG tablet Take 1 tablet (40 mg total) by mouth daily. 04/02/18  Yes Strader, Fransisco Hertz, PA-C  phenazopyridine (PYRIDIUM) 95 MG tablet Take 95 mg by mouth 3 (three) times daily as needed for pain.   Yes [provider]  rosuvastatin (CRESTOR) 20 MG tablet Take 1 tablet (20 mg total) by mouth every evening. 09/06/18  Yes Herminio Commons, MD  ursodiol (ACTIGALL) 300 MG capsule Take 300 mg by mouth 2 (two) times daily.    Yes [provider]    Physical Exam: BP (!) 146/70   Pulse 80   Temp 98.2 F (36.8 C)  (Rectal)   Resp (!) 22   Ht 5' 4"  (1.626 m)   Wt 70.8 kg   SpO2 98%   BMI 26.79 kg/m   . General: Elderly female. Awake and alert and oriented x3. No acute cardiopulmonary distress.  Marland Kitchen HEENT: Normocephalic atraumatic.  Right and left ears normal in appearance.  Pupils equal, round, reactive to light. Extraocular muscles are intact. Sclerae anicteric and noninjected.  Moist mucosal membranes. No mucosal lesions.  . Neck: Neck supple without lymphadenopathy. No carotid bruits. No masses palpated.  . Cardiovascular: Regular rate with normal S1-S2 sounds. No murmurs, rubs, gallops auscultated. No JVD.  Marland Kitchen Respiratory: Mild rales diffusely.  No accessory muscle use. . Abdomen: Soft, nontender, nondistended. Active bowel sounds. No masses or hepatosplenomegaly  . Skin: No rashes,  lesions, or ulcerations.  Dry, warm to touch. 2+ dorsalis pedis and radial pulses. . Musculoskeletal: No calf or leg pain. All major joints not erythematous nontender.  No upper or lower joint deformation.  Good ROM.  No contractures  . Psychiatric: Intact judgment and insight. Pleasant and cooperative. . Neurologic: No focal neurological deficits. Strength is 5/5 and symmetric in upper and lower extremities.  Cranial nerves II through XII are grossly intact.           Labs on Admission: I have personally reviewed following labs and imaging studies  CBC: Recent Labs  Lab 05/09/20 1219  WBC 8.1  NEUTROABS 5.4  HGB 11.8*  HCT 35.9*  MCV 95.0  PLT 789   Basic Metabolic Panel: Recent Labs  Lab 05/09/20 1219  NA 133*  K 3.7  CL 99  CO2 25  GLUCOSE 181*  BUN 19  CREATININE 0.70  CALCIUM 8.9   GFR: Estimated Creatinine Clearance: 45.1 mL/min (by C-G formula based on SCr of 0.7 mg/dL). Liver Function Tests: Recent Labs  Lab 05/09/20 1219  AST 16  ALT 13  ALKPHOS 93  BILITOT 0.4  PROT 6.8  ALBUMIN 3.6   No results for input(s): LIPASE, AMYLASE in the last 168 hours. No results for input(s):  AMMONIA in the last 168 hours. Coagulation Profile: No results for input(s): INR, PROTIME in the last 168 hours. Cardiac Enzymes: No results for input(s): CKTOTAL, CKMB, CKMBINDEX, TROPONINI in the last 168 hours. BNP (last 3 results) No results for input(s): PROBNP in the last 8760 hours. HbA1C: No results for input(s): HGBA1C in the last 72 hours. CBG: No results for input(s): GLUCAP in the last 168 hours. Lipid Profile: No results for input(s): CHOL, HDL, LDLCALC, TRIG, CHOLHDL, LDLDIRECT in the last 72 hours. Thyroid Function Tests: No results for input(s): TSH, T4TOTAL, FREET4, T3FREE, THYROIDAB in the last 72 hours. Anemia Panel: No results for input(s): VITAMINB12, FOLATE, FERRITIN, TIBC, IRON, RETICCTPCT in the last 72 hours. Urine analysis:    Component Value Date/Time   COLORURINE YELLOW 05/09/2020 1155   APPEARANCEUR CLEAR 05/09/2020 1155   LABSPEC 1.021 05/09/2020 1155   PHURINE 5.0 05/09/2020 1155   GLUCOSEU 50 (A) 05/09/2020 1155   HGBUR NEGATIVE 05/09/2020 1155   BILIRUBINUR NEGATIVE 05/09/2020 1155   KETONESUR 5 (A) 05/09/2020 1155   PROTEINUR 30 (A) 05/09/2020 1155   NITRITE NEGATIVE 05/09/2020 1155   LEUKOCYTESUR NEGATIVE 05/09/2020 1155   Sepsis Labs: @LABRCNTIP (procalcitonin:4,lacticidven:4) ) Recent Results (from the past 240 hour(s))  Resp Panel by RT-PCR (Flu A&B, Covid) Nasopharyngeal Swab     Status: Abnormal   Collection Time: 05/09/20  1:35 PM   Specimen: Nasopharyngeal Swab; Nasopharyngeal(NP) swabs in vial transport medium  Result Value Ref Range Status   SARS Coronavirus 2 by RT PCR POSITIVE (A) NEGATIVE Final    Comment: RESULT CALLED TO, READ BACK BY AND VERIFIED WITH: NICOLE DAVIS,RN @1449  05/09/2020 KAY (NOTE) SARS-CoV-2 target nucleic acids are DETECTED.  The SARS-CoV-2 RNA is generally detectable in upper respiratory specimens during the acute phase of infection. Positive results are indicative of the presence of the identified  virus, but do not rule out bacterial infection or co-infection with other pathogens not detected by the test. Clinical correlation with patient history and other diagnostic information is necessary to determine patient infection status. The expected result is Negative.  Fact Sheet for Patients: EntrepreneurPulse.com.au  Fact Sheet for Healthcare Providers: IncredibleEmployment.be  This test is not yet approved or cleared by the  [place] Architectural technologist and  has been authorized for detection and/or diagnosis of SARS-CoV-2 by FDA under an Print production planner (EUA).  This EUA will remain in effect (meaning this test can  be used) for the duration of  the COVID-19 declaration under Section 564(b)(1) of the Act, 21 U.S.C. section 360bbb-3(b)(1), unless the authorization is terminated or revoked sooner.     Influenza A by PCR NEGATIVE NEGATIVE Final   Influenza B by PCR NEGATIVE NEGATIVE Final    Comment: (NOTE) The Xpert Xpress SARS-CoV-2/FLU/RSV plus assay is intended as an aid in the diagnosis of influenza from Nasopharyngeal swab specimens and should not be used as a sole basis for treatment. Nasal washings and aspirates are unacceptable for Xpert Xpress SARS-CoV-2/FLU/RSV testing.  Fact Sheet for Patients: EntrepreneurPulse.com.au  Fact Sheet for Healthcare Providers: IncredibleEmployment.be  This test is not yet approved or cleared by the [place] FDA and has been authorized for detection and/or diagnosis of SARS-CoV-2 by FDA under an Emergency Use Authorization (EUA). This EUA will remain in effect (meaning this test can be used) for the duration of the COVID-19 declaration under Section 564(b)(1) of the Act, 21 U.S.C. section 360bbb-3(b)(1), unless the authorization is terminated or revoked.  Performed at [facility], [address] [place], [place] [address]   Blood culture (routine x 2)      Status: None (Preliminary result)   Collection Time: 05/09/20  4:01 PM   Specimen: Right Antecubital; Blood  Result Value Ref Range Status   Specimen Description   Final    RIGHT ANTECUBITAL BOTTLES DRAWN AEROBIC AND ANAEROBIC   Special Requests   Final    Blood Culture adequate volume Performed at [facility], [address], [place], [place] [address]    Culture PENDING  Incomplete   Report Status PENDING  Incomplete  Blood culture (routine x 2)     Status: None (Preliminary result)   Collection Time: 05/09/20  4:36 PM   Specimen: BLOOD RIGHT HAND  Result Value Ref Range Status   Specimen Description   Final    BLOOD RIGHT HAND BOTTLES DRAWN AEROBIC AND ANAEROBIC   Special Requests   Final    Blood Culture adequate volume Performed at [facility], [address] [place], [place] [address]    Culture PENDING  Incomplete   Report Status PENDING  Incomplete     Radiological Exams on Admission: CT Head Wo Contrast  Result Date: 05/09/2020 CLINICAL DATA:  Altered mental status EXAM: CT HEAD WITHOUT CONTRAST CT CERVICAL SPINE WITHOUT CONTRAST TECHNIQUE: Multidetector CT imaging of the head and cervical spine was performed following the standard protocol without intravenous contrast. Multiplanar CT image reconstructions of the cervical spine were also generated. COMPARISON:  11/05/2019 FINDINGS: CT HEAD FINDINGS Brain: No evidence of acute infarction, hemorrhage, hydrocephalus, extra-axial collection or mass lesion/mass effect. Chronic atrophic and ischemic changes are noted. Vascular: No hyperdense vessel or unexpected calcification. Skull: Normal. Negative for fracture or focal lesion. Sinuses/Orbits: Mild mucosal thickening is noted within the right maxillary antrum. Other: None. CT CERVICAL SPINE FINDINGS Alignment: Within normal limits. Skull base and vertebrae: 7 cervical segments are well visualized. Vertebral body height is well maintained. Multilevel facet hypertrophic changes and  osteophytic changes are noted. No anterolisthesis is seen. No compression deformity is noted. No acute facet abnormality is seen. Soft tissues and spinal canal: Surrounding soft tissue structures are within normal limits. Upper chest: Visualized lung apices are within normal limits. Other: None IMPRESSION: CT of the  head: Chronic atrophic and ischemic changes without acute abnormality. Mild mucosal thickening in the right maxillary antrum. CT of the cervical spine: Degenerative change without acute abnormality. Electronically Signed   By: Inez Catalina M.D.   On: 05/09/2020 14:38   CT Cervical Spine Wo Contrast  Result Date: 05/09/2020 CLINICAL DATA:  Altered mental status EXAM: CT HEAD WITHOUT CONTRAST CT CERVICAL SPINE WITHOUT CONTRAST TECHNIQUE: Multidetector CT imaging of the head and cervical spine was performed following the standard protocol without intravenous contrast. Multiplanar CT image reconstructions of the cervical spine were also generated. COMPARISON:  11/05/2019 FINDINGS: CT HEAD FINDINGS Brain: No evidence of acute infarction, hemorrhage, hydrocephalus, extra-axial collection or mass lesion/mass effect. Chronic atrophic and ischemic changes are noted. Vascular: No hyperdense vessel or unexpected calcification. Skull: Normal. Negative for fracture or focal lesion. Sinuses/Orbits: Mild mucosal thickening is noted within the right maxillary antrum. Other: None. CT CERVICAL SPINE FINDINGS Alignment: Within normal limits. Skull base and vertebrae: 7 cervical segments are well visualized. Vertebral body height is well maintained. Multilevel facet hypertrophic changes and osteophytic changes are noted. No anterolisthesis is seen. No compression deformity is noted. No acute facet abnormality is seen. Soft tissues and spinal canal: Surrounding soft tissue structures are within normal limits. Upper chest: Visualized lung apices are within normal limits. Other: None IMPRESSION: CT of the head: Chronic  atrophic and ischemic changes without acute abnormality. Mild mucosal thickening in the right maxillary antrum. CT of the cervical spine: Degenerative change without acute abnormality. Electronically Signed   By: Inez Catalina M.D.   On: 05/09/2020 14:38   DG Chest Portable 1 View  Result Date: 05/09/2020 CLINICAL DATA:  Altered mental status EXAM: PORTABLE CHEST 1 VIEW COMPARISON:  11/05/2019 FINDINGS: Cardiac shadow is enlarged. Aortic calcifications are noted. Lungs are well aerated bilaterally. No focal infiltrate or sizable effusion is seen. No bony abnormality is noted. IMPRESSION: No acute abnormality seen. Electronically Signed   By: Inez Catalina M.D.   On: 05/09/2020 14:39    EKG: Independently reviewed.  Rhythm with old inferior infarct  Assessment/Plan: Principal Problem:   Pneumonia due to COVID-19 virus Active Problems:   Type II diabetes mellitus ([place])   Hyperlipidemia   Essential hypertension   GERD (gastroesophageal reflux disease)   History of stroke   PAF (paroxysmal atrial fibrillation) ([place])   Ischemic cardiomyopathy    This patient was discussed with the ED physician, including pertinent vitals, physical exam findings, labs, and imaging.  We also discussed care given by the ED provider.  1. Covid pneumonia a. Admit new b. Remdesivir, steroids c. Repeat blood work in the morning d. On Eliquis for DVT prophylaxis 2. Altered mental status a. Question secondary to Covid versus UTI b. Urine culture pending c. We will start Rocephin 3. Type 2 diabetes a. Hold Metformin b. Start Januvia c. CBGs before meals and nightly 4. PAF a. Currently rate controlled b. Continue Eliquis 5. Ischemic cardiomyopathy with coronary artery disease 6. History of stroke 7. Hypertension a. Continue home regimen  DVT prophylaxis: Eliquis Consultants: None Code Status: Full code Family Communication: Unavailable Disposition Plan: Pending   Truett Mainland, DO

## 2020-05-09 NOTE — ED Triage Notes (Signed)
Pt brought in by [facility] from home with c/o AMS, urine incontinence, talking out of her head since last night. Family reported to EMS that she was up pacing the floors last night and "peeing on everything". They report this is her usual behavior when she gets a UTI.

## 2020-05-09 NOTE — ED Notes (Addendum)
Attempted to ambulate pt.  Pt sat on the side of the bed and stood sats 84% RA unable to ambulate due to SOB

## 2020-05-09 NOTE — ED Notes (Signed)
Pt very confused

## 2020-05-10 ENCOUNTER — Other Ambulatory Visit: Payer: Self-pay

## 2020-05-10 DIAGNOSIS — U071 COVID-19: Principal | ICD-10-CM

## 2020-05-10 DIAGNOSIS — I255 Ischemic cardiomyopathy: Secondary | ICD-10-CM

## 2020-05-10 DIAGNOSIS — J9601 Acute respiratory failure with hypoxia: Secondary | ICD-10-CM

## 2020-05-10 DIAGNOSIS — J1282 Pneumonia due to coronavirus disease 2019: Secondary | ICD-10-CM

## 2020-05-10 DIAGNOSIS — G9340 Encephalopathy, unspecified: Secondary | ICD-10-CM

## 2020-05-10 DIAGNOSIS — I480 Paroxysmal atrial fibrillation: Secondary | ICD-10-CM

## 2020-05-10 LAB — GLUCOSE, CAPILLARY
Glucose-Capillary: 247 mg/dL — ABNORMAL HIGH (ref 70–99)
Glucose-Capillary: 457 mg/dL — ABNORMAL HIGH (ref 70–99)
Glucose-Capillary: 362 mg/dL — ABNORMAL HIGH (ref 70–99)
Glucose-Capillary: 276 mg/dL — ABNORMAL HIGH (ref 70–99)

## 2020-05-10 LAB — CBC WITH DIFFERENTIAL/PLATELET
Abs Immature Granulocytes: 0.04 10*3/uL (ref 0.00–0.07)
Basophils Absolute: 0 10*3/uL (ref 0.0–0.1)
Basophils Relative: 0 %
Eosinophils Absolute: 0 10*3/uL (ref 0.0–0.5)
Eosinophils Relative: 0 %
HCT: 34.9 % — ABNORMAL LOW (ref 36.0–46.0)
Hemoglobin: 11.1 g/dL — ABNORMAL LOW (ref 12.0–15.0)
Immature Granulocytes: 1 %
Lymphocytes Relative: 15 %
Lymphs Abs: 0.8 10*3/uL (ref 0.7–4.0)
MCH: 30.7 pg (ref 26.0–34.0)
MCHC: 31.8 g/dL (ref 30.0–36.0)
MCV: 96.7 fL (ref 80.0–100.0)
Monocytes Absolute: 0.2 10*3/uL (ref 0.1–1.0)
Monocytes Relative: 3 %
Neutro Abs: 4.4 10*3/uL (ref 1.7–7.7)
Neutrophils Relative %: 81 %
Platelets: 164 10*3/uL (ref 150–400)
RBC: 3.61 MIL/uL — ABNORMAL LOW (ref 3.87–5.11)
RDW: 11.8 % (ref 11.5–15.5)
WBC: 5.5 10*3/uL (ref 4.0–10.5)
nRBC: 0 % (ref 0.0–0.2)

## 2020-05-10 LAB — GLUCOSE, RANDOM: Glucose, Bld: 514 mg/dL (ref 70–99)

## 2020-05-10 LAB — FERRITIN: Ferritin: 99 ng/mL (ref 11–307)

## 2020-05-10 LAB — MAGNESIUM: Magnesium: 1.2 mg/dL — ABNORMAL LOW (ref 1.7–2.4)

## 2020-05-10 LAB — C-REACTIVE PROTEIN: CRP: 15.2 mg/dL — ABNORMAL HIGH (ref <1.0)

## 2020-05-10 LAB — D-DIMER, QUANTITATIVE: D-Dimer, Quant: 0.56 ug/mL-FEU — ABNORMAL HIGH (ref 0.00–0.50)

## 2020-05-10 MED ORDER — ALBUTEROL SULFATE HFA 108 (90 BASE) MCG/ACT IN AERS
2.0000 | INHALATION_SPRAY | Freq: Four times a day (QID) | RESPIRATORY_TRACT | Status: DC
  Administered 2020-05-11: 2 via RESPIRATORY_TRACT

## 2020-05-10 MED ORDER — INSULIN ASPART 100 UNIT/ML ~~LOC~~ SOLN
5.0000 [IU] | Freq: Three times a day (TID) | SUBCUTANEOUS | Status: DC
  Administered 2020-05-10 – 2020-05-11 (×3): 5 [IU] via SUBCUTANEOUS

## 2020-05-10 MED ORDER — INSULIN DETEMIR 100 UNIT/ML ~~LOC~~ SOLN
10.0000 [IU] | Freq: Two times a day (BID) | SUBCUTANEOUS | Status: DC
  Administered 2020-05-10 – 2020-05-11 (×2): 10 [IU] via SUBCUTANEOUS
  Filled 2020-05-10 (×4): qty 0.1

## 2020-05-10 MED ORDER — INSULIN ASPART 100 UNIT/ML ~~LOC~~ SOLN
0.0000 [IU] | Freq: Every day | SUBCUTANEOUS | Status: DC
  Administered 2020-05-10: 3 [IU] via SUBCUTANEOUS

## 2020-05-10 MED ORDER — INSULIN ASPART 100 UNIT/ML ~~LOC~~ SOLN
0.0000 [IU] | Freq: Three times a day (TID) | SUBCUTANEOUS | Status: DC
  Administered 2020-05-10: 20 [IU] via SUBCUTANEOUS
  Administered 2020-05-11: 15 [IU] via SUBCUTANEOUS
  Administered 2020-05-11: 7 [IU] via SUBCUTANEOUS

## 2020-05-10 MED ORDER — MAGNESIUM SULFATE 2 GM/50ML IV SOLN
2.0000 g | Freq: Once | INTRAVENOUS | Status: AC
  Administered 2020-05-10: 2 g via INTRAVENOUS
  Filled 2020-05-10: qty 50

## 2020-05-10 MED ORDER — INSULIN ASPART 100 UNIT/ML ~~LOC~~ SOLN
20.0000 [IU] | Freq: Once | SUBCUTANEOUS | Status: AC
  Administered 2020-05-10: 20 [IU] via SUBCUTANEOUS

## 2020-05-10 MED ORDER — METOPROLOL SUCCINATE ER 25 MG PO TB24
25.0000 mg | ORAL_TABLET | Freq: Every day | ORAL | Status: DC
  Administered 2020-05-10 – 2020-05-11 (×2): 25 mg via ORAL
  Filled 2020-05-10 (×2): qty 1

## 2020-05-10 NOTE — Progress Notes (Signed)
Pt without complaint, IV remdesivir infusing. Tolerating food/fluid without n/v, fed self breakfast and ate 100%. Remains on room air, no SOB or cough. Pt has been assisted to [facility] x2, voiding clear yellow urine. Unsteady on feet, requires assist x1. Update given via phone to pt's daughter Veva Holes.

## 2020-05-10 NOTE — Progress Notes (Signed)
Central tele reported pt had 6 beat run of wide QRS. Upon assessment, pt sleeping, easily awakened. Oriented x4. SaO2 96% on RA, heart rate currently SR 70's with occas PVC. Pt denies any c/o. Blood glucose 247 mg/dl. Chest CTA with diminished breath sounds bilat. MD Tat notified of reported cardiac rhythm.

## 2020-05-10 NOTE — Progress Notes (Signed)
CRITICAL VALUE ALERT  Critical Value:  Glucose 514  Date & Time Notied:  05/10/2020 1341  Provider Notified: Dr. Carles Collet

## 2020-05-10 NOTE — Progress Notes (Addendum)
PROGRESS NOTE  Ana HOVANEC XNA:[id number] DOB: [birth year] DOA: 05/09/2020 PCP: Leeanne Rio, MD  Brief History:  86 year old female with a history of chronic impairment, stroke, ischemic cardiomyopathy with EF 40-45%, coronary artery disease, paroxysmal atrial fibrillation, diabetes mellitus type 2, hyperlipidemia, hypertension presenting with confusion.  Apparently, the patient was confused and pacing around the house aimlessly.  According to patient's family, the patient has urinary continence and was "peeing on everything".  The patient has had some associated nonproductive cough and chest congestion.  The patient herself stated that she had some nausea and vomiting on the day of admission without any frank diarrhea, hematochezia, melena.  In the emergency department, the patient had temperature 100.6 F with soft blood pressures.  Systolic blood pressures were in the upper 90s and low 100s.  BMP showed a sodium 133 and serum creatinine 0.70.  CBC was essentially unremarkable.  COVID-19 RT-PCR was positive.  Chest x-ray showed chronic interstitial markings.  The patient was noted with oxygen saturation of 88% on room air.  She was started on remdesivir and steroids.  Assessment/Plan: Acute respiratory failure with hypoxia -Secondary to COVID-19 pneumonia -Presented with oxygen saturation 85% on room air -Placed on 2 L with saturation 97-98% -Wean oxygen as tolerated -CRP 16.4>> 15.2 -Ferritin 88>> 99 -D-dimer 0.61>> 0.56 -PCT 0.11 -Continue remdesivir and IV steroids -Continue vitamin C and zinc  Acute metabolic encephalopathy -Secondary to COVID-19 infection -UA negative for pyuria -Mental status improving  Uncontrolled diabetes mellitus type 2 with hyperglycemia -09/09/2019 hemoglobin A1c 7.6 -Holding Metformin -Elevated CBGs in part due to steroids -Increase Levemir to 10 mg twice daily -Add NovoLog 5 units with meals -Escalate to resistant dose sliding  scale -Continue Tradjenta for now  Ischemic cardiomyopathy/chronic systolic CHF - Her EF was previously reduced to 40-45% by echo in 09/2018, normalized to 55-60% by repeat echo in 09/2019 -She is clinically euvolemic  Paroxysmal atrial fibrillation -Presently in sinus rhythm -Continue apixaban -Continue metoprolol succinate  Essential hypertension -Holding amlodipine and losartan secondary to soft blood pressure  Hyperlipidemia -Continue statin  Hypomagnesemia -Replete  Moderate aortic stenosis -Noted on echo 09/30/2019 -Outpatient follow-up with cardiology  Hyponatremia -In part secondary to volume depletion and hyperglycemia -Judicious IV fluids      Status is: Inpatient  Remains inpatient appropriate because:Unsafe d/c plan and IV treatments appropriate due to intensity of illness or inability to take PO   Dispo: The patient is from: Home              Anticipated d/c is to: Home              Anticipated d/c date is: 1 day              Patient currently is not medically stable to d/c.        Family Communication:   Son updated 12/6  Consultants:  none  Code Status:  FULL   DVT Prophylaxis:  apixaban   Procedures: As Listed in Progress Note Above  Antibiotics: None       Subjective: Patient denies fevers, chills, headache, chest pain, dyspnea, nausea, vomiting, diarrhea, abdominal pain, dysuria, hematuria, hematochezia, and melena.   Objective: Vitals:   05/10/20 0500 05/10/20 0800 05/10/20 0918 05/10/20 1042  BP: (!) 99/50   (!) 118/51  Pulse: 75 72  93  Resp: 18 16  18   Temp: 98 F (36.7 C)   97.6 F (36.4 C)  TempSrc:    Other (Comment)  SpO2: 97% 96% 94% 95%  Weight:      Height:        Intake/Output Summary (Last 24 hours) at 05/10/2020 1505 Last data filed at 05/10/2020 1300 Gross per 24 hour  Intake 200 ml  Output 401 ml  Net -201 ml   Weight change:  Exam:   General:  Pt is alert, follows commands appropriately,  not in acute distress  HEENT: No icterus, No thrush, No neck mass, [place]/AT  Cardiovascular: RRR, S1/S2, no rubs, no gallops  Respiratory: bibasilar crackles.   Abdomen: Soft/+BS, non tender, non distended, no guarding  Extremities: No edema, No lymphangitis, No petechiae, No rashes, no synovitis   Data Reviewed: I have personally reviewed following labs and imaging studies Basic Metabolic Panel: Recent Labs  Lab 05/09/20 1219 05/10/20 0654 05/10/20 1249  NA 133*  --   --   K 3.7  --   --   CL 99  --   --   CO2 25  --   --   GLUCOSE 181*  --  514*  BUN 19  --   --   CREATININE 0.70  --   --   CALCIUM 8.9  --   --   MG  --  1.2*  --    Liver Function Tests: Recent Labs  Lab 05/09/20 1219  AST 16  ALT 13  ALKPHOS 93  BILITOT 0.4  PROT 6.8  ALBUMIN 3.6   No results for input(s): LIPASE, AMYLASE in the last 168 hours. No results for input(s): AMMONIA in the last 168 hours. Coagulation Profile: No results for input(s): INR, PROTIME in the last 168 hours. CBC: Recent Labs  Lab 05/09/20 1219 05/10/20 0654  WBC 8.1 5.5  NEUTROABS 5.4 4.4  HGB 11.8* 11.1*  HCT 35.9* 34.9*  MCV 95.0 96.7  PLT 163 164   Cardiac Enzymes: No results for input(s): CKTOTAL, CKMB, CKMBINDEX, TROPONINI in the last 168 hours. BNP: Invalid input(s): POCBNP CBG: Recent Labs  Lab 05/09/20 2026 05/10/20 0750 05/10/20 1207  GLUCAP 250* 247* 457*   HbA1C: No results for input(s): HGBA1C in the last 72 hours. Urine analysis:    Component Value Date/Time   COLORURINE YELLOW 05/09/2020 1155   APPEARANCEUR CLEAR 05/09/2020 1155   LABSPEC 1.021 05/09/2020 1155   PHURINE 5.0 05/09/2020 1155   GLUCOSEU 50 (A) 05/09/2020 1155   HGBUR NEGATIVE 05/09/2020 1155   BILIRUBINUR NEGATIVE 05/09/2020 1155   KETONESUR 5 (A) 05/09/2020 1155   PROTEINUR 30 (A) 05/09/2020 1155   NITRITE NEGATIVE 05/09/2020 1155   LEUKOCYTESUR NEGATIVE 05/09/2020 1155   Sepsis  Labs: @LABRCNTIP (procalcitonin:4,lacticidven:4) ) Recent Results (from the past 240 hour(s))  Resp Panel by RT-PCR (Flu A&B, Covid) Nasopharyngeal Swab     Status: Abnormal   Collection Time: 05/09/20  1:35 PM   Specimen: Nasopharyngeal Swab; Nasopharyngeal(NP) swabs in vial transport medium  Result Value Ref Range Status   SARS Coronavirus 2 by RT PCR POSITIVE (A) NEGATIVE Final    Comment: RESULT CALLED TO, READ BACK BY AND VERIFIED WITH: NICOLE DAVIS,RN @1449  05/09/2020 KAY (NOTE) SARS-CoV-2 target nucleic acids are DETECTED.  The SARS-CoV-2 RNA is generally detectable in upper respiratory specimens during the acute phase of infection. Positive results are indicative of the presence of the identified virus, but do not rule out bacterial infection or co-infection with other pathogens not detected by the test. Clinical correlation with patient history and other diagnostic information is necessary  to determine patient infection status. The expected result is Negative.  Fact Sheet for Patients: EntrepreneurPulse.com.au  Fact Sheet for Healthcare Providers: IncredibleEmployment.be  This test is not yet approved or cleared by the [place] FDA and  has been authorized for detection and/or diagnosis of SARS-CoV-2 by FDA under an Emergency Use Authorization (EUA).  This EUA will remain in effect (meaning this test can  be used) for the duration of  the COVID-19 declaration under Section 564(b)(1) of the Act, 21 U.S.C. section 360bbb-3(b)(1), unless the authorization is terminated or revoked sooner.     Influenza A by PCR NEGATIVE NEGATIVE Final   Influenza B by PCR NEGATIVE NEGATIVE Final    Comment: (NOTE) The Xpert Xpress SARS-CoV-2/FLU/RSV plus assay is intended as an aid in the diagnosis of influenza from Nasopharyngeal swab specimens and should not be used as a sole basis for treatment. Nasal washings and aspirates are unacceptable for  Xpert Xpress SARS-CoV-2/FLU/RSV testing.  Fact Sheet for Patients: EntrepreneurPulse.com.au  Fact Sheet for Healthcare Providers: IncredibleEmployment.be  This test is not yet approved or cleared by the [place] FDA and has been authorized for detection and/or diagnosis of SARS-CoV-2 by FDA under an Emergency Use Authorization (EUA). This EUA will remain in effect (meaning this test can be used) for the duration of the COVID-19 declaration under Section 564(b)(1) of the Act, 21 U.S.C. section 360bbb-3(b)(1), unless the authorization is terminated or revoked.  Performed at [facility], [address] [place], [place] [address]   Blood culture (routine x 2)     Status: None (Preliminary result)   Collection Time: 05/09/20  4:01 PM   Specimen: Right Antecubital; Blood  Result Value Ref Range Status   Specimen Description   Final    RIGHT ANTECUBITAL BOTTLES DRAWN AEROBIC AND ANAEROBIC   Special Requests Blood Culture adequate volume  Final   Culture   Final    NO GROWTH < 24 HOURS Performed at [facility], [address] [place], [place] [address]    Report Status PENDING  Incomplete  Blood culture (routine x 2)     Status: None (Preliminary result)   Collection Time: 05/09/20  4:36 PM   Specimen: BLOOD RIGHT HAND  Result Value Ref Range Status   Specimen Description   Final    BLOOD RIGHT HAND BOTTLES DRAWN AEROBIC AND ANAEROBIC   Special Requests Blood Culture adequate volume  Final   Culture   Final    NO GROWTH < 24 HOURS Performed at [facility], [address], [place], [place] [address]    Report Status PENDING  Incomplete     Scheduled Meds: . albuterol  2 puff Inhalation Q6H  . apixaban  5 mg Oral BID  . vitamin C  500 mg Oral Daily  . aspirin EC  81 mg Oral Daily  . dexamethasone  6 mg Oral Q24H  . insulin aspart  0-20 Units Subcutaneous TID WC  . insulin aspart  0-5 Units Subcutaneous QHS  . insulin aspart  5 Units  Subcutaneous TID WC  . insulin detemir  10 Units Subcutaneous BID  . linagliptin  5 mg Oral Daily  . loratadine  10 mg Oral Daily  . metoprolol succinate  25 mg Oral Daily  . pantoprazole  40 mg Oral Daily  . phenazopyridine  100 mg Oral TID WC  . rosuvastatin  20 mg Oral QPM  . ursodiol  300 mg Oral BID  . zinc sulfate  220 mg Oral Daily  Continuous Infusions: . cefTRIAXone (ROCEPHIN)  IV    . magnesium sulfate bolus IVPB    . remdesivir 100 mg in NS 100 mL 100 mg (05/10/20 1057)    Procedures/Studies: CT Head Wo Contrast  Result Date: 05/09/2020 CLINICAL DATA:  Altered mental status EXAM: CT HEAD WITHOUT CONTRAST CT CERVICAL SPINE WITHOUT CONTRAST TECHNIQUE: Multidetector CT imaging of the head and cervical spine was performed following the standard protocol without intravenous contrast. Multiplanar CT image reconstructions of the cervical spine were also generated. COMPARISON:  11/05/2019 FINDINGS: CT HEAD FINDINGS Brain: No evidence of acute infarction, hemorrhage, hydrocephalus, extra-axial collection or mass lesion/mass effect. Chronic atrophic and ischemic changes are noted. Vascular: No hyperdense vessel or unexpected calcification. Skull: Normal. Negative for fracture or focal lesion. Sinuses/Orbits: Mild mucosal thickening is noted within the right maxillary antrum. Other: None. CT CERVICAL SPINE FINDINGS Alignment: Within normal limits. Skull base and vertebrae: 7 cervical segments are well visualized. Vertebral body height is well maintained. Multilevel facet hypertrophic changes and osteophytic changes are noted. No anterolisthesis is seen. No compression deformity is noted. No acute facet abnormality is seen. Soft tissues and spinal canal: Surrounding soft tissue structures are within normal limits. Upper chest: Visualized lung apices are within normal limits. Other: None IMPRESSION: CT of the head: Chronic atrophic and ischemic changes without acute abnormality. Mild mucosal  thickening in the right maxillary antrum. CT of the cervical spine: Degenerative change without acute abnormality. Electronically Signed   By: Inez Catalina M.D.   On: 05/09/2020 14:38   CT Cervical Spine Wo Contrast  Result Date: 05/09/2020 CLINICAL DATA:  Altered mental status EXAM: CT HEAD WITHOUT CONTRAST CT CERVICAL SPINE WITHOUT CONTRAST TECHNIQUE: Multidetector CT imaging of the head and cervical spine was performed following the standard protocol without intravenous contrast. Multiplanar CT image reconstructions of the cervical spine were also generated. COMPARISON:  11/05/2019 FINDINGS: CT HEAD FINDINGS Brain: No evidence of acute infarction, hemorrhage, hydrocephalus, extra-axial collection or mass lesion/mass effect. Chronic atrophic and ischemic changes are noted. Vascular: No hyperdense vessel or unexpected calcification. Skull: Normal. Negative for fracture or focal lesion. Sinuses/Orbits: Mild mucosal thickening is noted within the right maxillary antrum. Other: None. CT CERVICAL SPINE FINDINGS Alignment: Within normal limits. Skull base and vertebrae: 7 cervical segments are well visualized. Vertebral body height is well maintained. Multilevel facet hypertrophic changes and osteophytic changes are noted. No anterolisthesis is seen. No compression deformity is noted. No acute facet abnormality is seen. Soft tissues and spinal canal: Surrounding soft tissue structures are within normal limits. Upper chest: Visualized lung apices are within normal limits. Other: None IMPRESSION: CT of the head: Chronic atrophic and ischemic changes without acute abnormality. Mild mucosal thickening in the right maxillary antrum. CT of the cervical spine: Degenerative change without acute abnormality. Electronically Signed   By: Inez Catalina M.D.   On: 05/09/2020 14:38   DG Chest Portable 1 View  Result Date: 05/09/2020 CLINICAL DATA:  Altered mental status EXAM: PORTABLE CHEST 1 VIEW COMPARISON:  11/05/2019  FINDINGS: Cardiac shadow is enlarged. Aortic calcifications are noted. Lungs are well aerated bilaterally. No focal infiltrate or sizable effusion is seen. No bony abnormality is noted. IMPRESSION: No acute abnormality seen. Electronically Signed   By: Inez Catalina M.D.   On: 05/09/2020 14:39    Orson Eva, DO  [facility]  If 7PM-7AM, please contact night-coverage [email or web address] Password [email or web address] 05/10/2020, 3:05 PM   LOS: 1 day

## 2020-05-11 DIAGNOSIS — J9601 Acute respiratory failure with hypoxia: Secondary | ICD-10-CM

## 2020-05-11 DIAGNOSIS — I10 Essential (primary) hypertension: Secondary | ICD-10-CM

## 2020-05-11 LAB — CBC WITH DIFFERENTIAL/PLATELET
Abs Immature Granulocytes: 0.08 10*3/uL — ABNORMAL HIGH (ref 0.00–0.07)
Basophils Absolute: 0 10*3/uL (ref 0.0–0.1)
Basophils Relative: 0 %
Eosinophils Absolute: 0 10*3/uL (ref 0.0–0.5)
Eosinophils Relative: 0 %
HCT: 35.4 % — ABNORMAL LOW (ref 36.0–46.0)
Hemoglobin: 11.8 g/dL — ABNORMAL LOW (ref 12.0–15.0)
Immature Granulocytes: 1 %
Lymphocytes Relative: 8 %
Lymphs Abs: 1.1 10*3/uL (ref 0.7–4.0)
MCH: 31.2 pg (ref 26.0–34.0)
MCHC: 33.3 g/dL (ref 30.0–36.0)
MCV: 93.7 fL (ref 80.0–100.0)
Monocytes Absolute: 0.4 10*3/uL (ref 0.1–1.0)
Monocytes Relative: 3 %
Neutro Abs: 12.6 10*3/uL — ABNORMAL HIGH (ref 1.7–7.7)
Neutrophils Relative %: 88 %
Platelets: 209 10*3/uL (ref 150–400)
RBC: 3.78 MIL/uL — ABNORMAL LOW (ref 3.87–5.11)
RDW: 11.9 % (ref 11.5–15.5)
WBC: 14.3 10*3/uL — ABNORMAL HIGH (ref 4.0–10.5)
nRBC: 0 % (ref 0.0–0.2)

## 2020-05-11 LAB — URINE CULTURE: Culture: NO GROWTH

## 2020-05-11 LAB — COMPREHENSIVE METABOLIC PANEL
ALT: 13 U/L (ref 0–44)
AST: 15 U/L (ref 15–41)
Albumin: 3.4 g/dL — ABNORMAL LOW (ref 3.5–5.0)
Alkaline Phosphatase: 83 U/L (ref 38–126)
Anion gap: 12 (ref 5–15)
BUN: 27 mg/dL — ABNORMAL HIGH (ref 8–23)
CO2: 23 mmol/L (ref 22–32)
Calcium: 9.3 mg/dL (ref 8.9–10.3)
Chloride: 101 mmol/L (ref 98–111)
Creatinine, Ser: 0.71 mg/dL (ref 0.44–1.00)
GFR, Estimated: >60 mL/min (ref >60)
Glucose, Bld: 241 mg/dL — ABNORMAL HIGH (ref 70–99)
Potassium: 3.9 mmol/L (ref 3.5–5.1)
Sodium: 136 mmol/L (ref 135–145)
Total Bilirubin: 0.4 mg/dL (ref 0.3–1.2)
Total Protein: 6.5 g/dL (ref 6.5–8.1)

## 2020-05-11 LAB — MAGNESIUM: Magnesium: 1.8 mg/dL (ref 1.7–2.4)

## 2020-05-11 LAB — FERRITIN: Ferritin: 117 ng/mL (ref 11–307)

## 2020-05-11 LAB — C-REACTIVE PROTEIN: CRP: 8.1 mg/dL — ABNORMAL HIGH (ref <1.0)

## 2020-05-11 LAB — GLUCOSE, CAPILLARY
Glucose-Capillary: 248 mg/dL — ABNORMAL HIGH (ref 70–99)
Glucose-Capillary: 308 mg/dL — ABNORMAL HIGH (ref 70–99)

## 2020-05-11 LAB — D-DIMER, QUANTITATIVE: D-Dimer, Quant: 0.49 ug/mL-FEU (ref 0.00–0.50)

## 2020-05-11 LAB — HEMOGLOBIN A1C
Hgb A1c MFr Bld: 8.2 % — ABNORMAL HIGH (ref 4.8–5.6)
Mean Plasma Glucose: 188.64 mg/dL

## 2020-05-11 MED ORDER — ALBUTEROL SULFATE HFA 108 (90 BASE) MCG/ACT IN AERS: 2.0000 | INHALATION_SPRAY | Freq: Four times a day (QID) | RESPIRATORY_TRACT | Status: DC | PRN

## 2020-05-11 MED ORDER — "INSULIN SYRINGE-NEEDLE U-100 31G X 1/4"" 1 ML MISC": 0 refills | Status: AC

## 2020-05-11 MED ORDER — INSULIN DETEMIR 100 UNIT/ML ~~LOC~~ SOLN: 10.0000 [IU] | Freq: Two times a day (BID) | SUBCUTANEOUS | 0 refills | Status: AC

## 2020-05-11 MED ORDER — DEXAMETHASONE 6 MG PO TABS: 6.0000 mg | ORAL_TABLET | ORAL | 0 refills | Status: AC

## 2020-05-11 MED ORDER — ALBUTEROL SULFATE HFA 108 (90 BASE) MCG/ACT IN AERS: 2.0000 | INHALATION_SPRAY | Freq: Two times a day (BID) | RESPIRATORY_TRACT | Status: DC

## 2020-05-11 MED ORDER — METOPROLOL SUCCINATE ER 50 MG PO TB24: 25.0000 mg | ORAL_TABLET | Freq: Every day | ORAL | 1 refills | Status: AC

## 2020-05-11 NOTE — TOC Transition Note (Signed)
Transition of Care [facility]) - CM/SW Discharge Note   Patient Details  Name: [name] MRN: [id number] Date of Birth: 03/13/1928  Transition of Care [facility]) CM/SW Contact:  Ihor Gully, LCSW Phone Number: 05/11/2020, 12:07 PM   Clinical Narrative:    Patient discharging. Had [facility] in past. Agreeable to [facility] again for HHPT. Referral made.   Final next level of care: [place] Barriers to Discharge: No Barriers Identified   Patient Goals and CMS Choice        Discharge Placement                       Discharge Plan and Services                          HH Arranged: PT [place] Agency: [place] ([facility]) Date HH Agency Contacted: 05/11/20 Time [place]: 1207 Representative spoke with at [place]: [place] Determinants of Health ([place]) Interventions     Readmission Risk Interventions No flowsheet data found.

## 2020-05-11 NOTE — Progress Notes (Signed)
Patient scheduled for outpatient Remdesivir infusions at 11am on Wednesday 12/8 and Thursday 12/9 at [facility]. Please inform the patient to park at [place], as staff will be escorting the patient through the [place] entrance of the hospital. Appointments take approximately 45 minutes.    There is a wave flag banner located near the entrance on [address]. Turn into this entrance and immediately turn left or right and park in 1 of the 10 designated Covid Infusion Parking spots. There is a phone number on the sign, please call and let the staff know what spot you are in and we will come out and get you. For questions call [phone].  Thanks.

## 2020-05-11 NOTE — Discharge Instructions (Signed)
You are scheduled for an outpatient Remdesivir infusion at 11am on Wednesday 12/8 and Thursday 12/9 at [facility]. Please park at [place], as staff will be escorting you through the [place] entrance of the hospital. Appointments take approximately 45 minutes.    The address for the infusion clinic site is:  --GPS address is [place] - the parking is located near [employer] building where you will see  COVID19 Infusion feather banner marking the entrance to parking.   (see photos below)            --Enter into the 2nd entrance where the "wave, flag banner" is at the road. Turn into this 2nd entrance and immediately turn left to park in 1 of the 5 parking spots.   --Please stay in your car and call the desk for assistance inside [phone].   The day of your visit you should: [place] Kitchen Get plenty of rest the night before and drink plenty of water . Eat a light meal/snack before coming and take your medications as prescribed  . Wear warm, comfortable clothes with a shirt that can roll-up over the elbow (will need IV start).  . Wear a mask  . Consider bringing some activity to help pass the time

## 2020-05-11 NOTE — Discharge Summary (Signed)
Physician Discharge Summary  [name] WLN:[id number] DOB: 10/03/1927 DOA: 05/09/2020  PCP: Leeanne Rio, MD  Admit date: 05/09/2020 Discharge date: 05/11/2020  Admitted From: Home Disposition:  Home   Recommendations for Outpatient Follow-up:  1. Follow up with PCP in 1-2 weeks 2. Please obtain BMP/CBC in one week   Home Health: YES Equipment/Devices: HHPT  Discharge Condition: Stable CODE STATUS: FULL Diet recommendation: Heart Healthy / Carb Modified   Brief/Interim Summary: 86 year old female with a history of chronic impairment, stroke, ischemic cardiomyopathy with EF 40-45%, coronary artery disease, paroxysmal atrial fibrillation, diabetes mellitus type 2, hyperlipidemia, hypertension presenting with confusion.  Apparently, the patient was confused and pacing around the house aimlessly.  According to patient's family, the patient has urinary continence and was "peeing on everything".  The patient has had some associated nonproductive cough and chest congestion.  The patient herself stated that she had some nausea and vomiting on the day of admission without any frank diarrhea, hematochezia, melena.  In the emergency department, the patient had temperature 100.6 F with soft blood pressures.  Systolic blood pressures were in the upper 90s and low 100s.  BMP showed a sodium 133 and serum creatinine 0.70.  CBC was essentially unremarkable.  COVID-19 RT-PCR was positive.  Chest x-ray showed chronic interstitial markings.  The patient was noted with oxygen saturation of 88% on room air.  She was started on remdesivir and steroids.  Discharge Diagnoses:  Acute respiratory failure with hypoxia -Secondary to COVID-19 pneumonia -Presented with oxygen saturation 85% on room air -Placed on 2 L with saturation 97-98% -Wean oxygen as tolerated -CRP 16.4>> 15.2>>8.1 -Ferritin 88>> 99>>117 -D-dimer 0.61>> 0.56>>0.49 -PCT 0.11 -Continue remdesivir and IV steroids -Continue  vitamin C and zinc -outpatient remdesivir infusions set up for 12/8 and 12/9 -d/c home with dexamethasone x 7 more days  Acute metabolic encephalopathy -Secondary to COVID-19 infection -UA negative for pyuria -Mental status improved, back to baseline  Uncontrolled diabetes mellitus type 2 with hyperglycemia -09/09/2019 hemoglobin A1c 7.6 -Holding Metformin--restart after d/c -Elevated CBGs in part due to steroids -Increase Levemir to 10 mg twice daily-->send home with this regimen x 7 days while finishing dexamethasone -Add NovoLog 5 units with meals -Escalate to resistant dose sliding scale -Continue Tradjenta during hospitalization  Ischemic cardiomyopathy/chronic systolic CHF - HerEFwas previously reduced to40-45% by echo in 09/2018, normalized to 55-60% by repeat echo in 09/2019 -She is clinically euvolemic  Paroxysmal atrial fibrillation -Presently in sinus rhythm -Continue apixaban -Continue metoprolol succinate  Essential hypertension -Holding amlodipine and losartan secondary to soft blood pressure -continue metoprolol  Hyperlipidemia -Continue statin  Hypomagnesemia -Repleted  Moderate aortic stenosis -Noted on echo 09/30/2019 -Outpatient follow-up with cardiology  Hyponatremia -In part secondary to volume depletion and hyperglycemia -Judicious IV fluids  Discharge Instructions   Allergies as of 05/11/2020      Reactions   Ivp Dye [iodinated Diagnostic Agents] Anaphylaxis   Sulfonamide Derivatives Shortness Of Breath   Lipitor [atorvastatin Calcium]    Muscle Aches   Zetia [ezetimibe]    Muscle Aches   Penicillins Rash   As a teenager.      Medication List    STOP taking these medications   amLODipine 10 MG tablet Commonly known as: NORVASC   losartan 100 MG tablet Commonly known as: COZAAR     TAKE these medications   acetaminophen 500 MG tablet Commonly known as: TYLENOL Take 500 mg by mouth every 6 (six) hours as needed.     aspirin  EC 81 MG tablet Take 81 mg by mouth daily.   dexamethasone 6 MG tablet Commonly known as: DECADRON Take 1 tablet (6 mg total) by mouth daily.   Eliquis 5 MG Tabs tablet Generic drug: apixaban Take 5 mg by mouth 2 (two) times daily.   fexofenadine 180 MG tablet Commonly known as: ALLEGRA Take 180 mg by mouth daily.   insulin detemir 100 UNIT/ML injection Commonly known as: LEVEMIR Inject 0.1 mLs (10 Units total) into the skin 2 (two) times daily. X 7 days   Insulin Syringe-Needle U-100 31G X 1/4" 1 ML Misc Use to dispense insulin as directed   metFORMIN 500 MG 24 hr tablet Commonly known as: GLUCOPHAGE-XR Take 1 tablet (500 mg total) by mouth 2 (two) times daily with a meal.   metoprolol succinate 50 MG 24 hr tablet Commonly known as: TOPROL-XL Take 0.5 tablets (25 mg total) by mouth daily. Start taking on: May 12, 2020 What changed:   medication strength  how much to take  when to take this  additional instructions   nitroGLYCERIN 0.4 MG SL tablet Commonly known as: NITROSTAT Place 0.4 mg under the tongue every 5 (five) minutes as needed for chest pain.   pantoprazole 40 MG tablet Commonly known as: PROTONIX Take 1 tablet (40 mg total) by mouth daily.   phenazopyridine 95 MG tablet Commonly known as: PYRIDIUM Take 95 mg by mouth 3 (three) times daily as needed for pain.   REFRESH 1 % ophthalmic solution Generic drug: carboxymethylcellulose Place 1 drop into both eyes 2 (two) times daily as needed (dry eyes).   rosuvastatin 20 MG tablet Commonly known as: CRESTOR Take 1 tablet (20 mg total) by mouth every evening.   ursodiol 300 MG capsule Commonly known as: ACTIGALL Take 300 mg by mouth 2 (two) times daily.       Allergies  Allergen Reactions  . Ivp Dye [Iodinated Diagnostic Agents] Anaphylaxis  . Sulfonamide Derivatives Shortness Of Breath  . Lipitor [Atorvastatin Calcium]     Muscle Aches  . Zetia [Ezetimibe]     Muscle Aches   . Penicillins Rash    As a teenager.      Consultations:  none   Procedures/Studies: CT Head Wo Contrast  Result Date: 05/09/2020 CLINICAL DATA:  Altered mental status EXAM: CT HEAD WITHOUT CONTRAST CT CERVICAL SPINE WITHOUT CONTRAST TECHNIQUE: Multidetector CT imaging of the head and cervical spine was performed following the standard protocol without intravenous contrast. Multiplanar CT image reconstructions of the cervical spine were also generated. COMPARISON:  11/05/2019 FINDINGS: CT HEAD FINDINGS Brain: No evidence of acute infarction, hemorrhage, hydrocephalus, extra-axial collection or mass lesion/mass effect. Chronic atrophic and ischemic changes are noted. Vascular: No hyperdense vessel or unexpected calcification. Skull: Normal. Negative for fracture or focal lesion. Sinuses/Orbits: Mild mucosal thickening is noted within the right maxillary antrum. Other: None. CT CERVICAL SPINE FINDINGS Alignment: Within normal limits. Skull base and vertebrae: 7 cervical segments are well visualized. Vertebral body height is well maintained. Multilevel facet hypertrophic changes and osteophytic changes are noted. No anterolisthesis is seen. No compression deformity is noted. No acute facet abnormality is seen. Soft tissues and spinal canal: Surrounding soft tissue structures are within normal limits. Upper chest: Visualized lung apices are within normal limits. Other: None IMPRESSION: CT of the head: Chronic atrophic and ischemic changes without acute abnormality. Mild mucosal thickening in the right maxillary antrum. CT of the cervical spine: Degenerative change without acute abnormality. Electronically Signed   By: Elta Guadeloupe  Lukens M.D.   On: 05/09/2020 14:38   CT Cervical Spine Wo Contrast  Result Date: 05/09/2020 CLINICAL DATA:  Altered mental status EXAM: CT HEAD WITHOUT CONTRAST CT CERVICAL SPINE WITHOUT CONTRAST TECHNIQUE: Multidetector CT imaging of the head and cervical spine was performed  following the standard protocol without intravenous contrast. Multiplanar CT image reconstructions of the cervical spine were also generated. COMPARISON:  11/05/2019 FINDINGS: CT HEAD FINDINGS Brain: No evidence of acute infarction, hemorrhage, hydrocephalus, extra-axial collection or mass lesion/mass effect. Chronic atrophic and ischemic changes are noted. Vascular: No hyperdense vessel or unexpected calcification. Skull: Normal. Negative for fracture or focal lesion. Sinuses/Orbits: Mild mucosal thickening is noted within the right maxillary antrum. Other: None. CT CERVICAL SPINE FINDINGS Alignment: Within normal limits. Skull base and vertebrae: 7 cervical segments are well visualized. Vertebral body height is well maintained. Multilevel facet hypertrophic changes and osteophytic changes are noted. No anterolisthesis is seen. No compression deformity is noted. No acute facet abnormality is seen. Soft tissues and spinal canal: Surrounding soft tissue structures are within normal limits. Upper chest: Visualized lung apices are within normal limits. Other: None IMPRESSION: CT of the head: Chronic atrophic and ischemic changes without acute abnormality. Mild mucosal thickening in the right maxillary antrum. CT of the cervical spine: Degenerative change without acute abnormality. Electronically Signed   By: Inez Catalina M.D.   On: 05/09/2020 14:38   DG Chest Portable 1 View  Result Date: 05/09/2020 CLINICAL DATA:  Altered mental status EXAM: PORTABLE CHEST 1 VIEW COMPARISON:  11/05/2019 FINDINGS: Cardiac shadow is enlarged. Aortic calcifications are noted. Lungs are well aerated bilaterally. No focal infiltrate or sizable effusion is seen. No bony abnormality is noted. IMPRESSION: No acute abnormality seen. Electronically Signed   By: Inez Catalina M.D.   On: 05/09/2020 14:39        Discharge Exam: Vitals:   05/11/20 0744 05/11/20 0825  BP: 130/73   Pulse: 84   Resp: 17   Temp: 98 F (36.7 C)   SpO2:  95% 96%   Vitals:   05/10/20 1943 05/11/20 0628 05/11/20 0744 05/11/20 0825  BP:  (!) 143/71 130/73   Pulse:  93 84   Resp:  18 17   Temp:  97.6 F (36.4 C) 98 F (36.7 C)   TempSrc:  Oral Oral   SpO2: 91% 98% 95% 96%  Weight:      Height:        General: Pt is alert, awake, not in acute distress Cardiovascular: RRR, S1/S2 +, no rubs, no gallops Respiratory: CTA bilaterally, no wheezing, no rhonchi Abdominal: Soft, NT, ND, bowel sounds + Extremities: no edema, no cyanosis   The results of significant diagnostics from this hospitalization (including imaging, microbiology, ancillary and laboratory) are listed below for reference.    Significant Diagnostic Studies: CT Head Wo Contrast  Result Date: 05/09/2020 CLINICAL DATA:  Altered mental status EXAM: CT HEAD WITHOUT CONTRAST CT CERVICAL SPINE WITHOUT CONTRAST TECHNIQUE: Multidetector CT imaging of the head and cervical spine was performed following the standard protocol without intravenous contrast. Multiplanar CT image reconstructions of the cervical spine were also generated. COMPARISON:  11/05/2019 FINDINGS: CT HEAD FINDINGS Brain: No evidence of acute infarction, hemorrhage, hydrocephalus, extra-axial collection or mass lesion/mass effect. Chronic atrophic and ischemic changes are noted. Vascular: No hyperdense vessel or unexpected calcification. Skull: Normal. Negative for fracture or focal lesion. Sinuses/Orbits: Mild mucosal thickening is noted within the right maxillary antrum. Other: None. CT CERVICAL SPINE FINDINGS Alignment: Within normal  limits. Skull base and vertebrae: 7 cervical segments are well visualized. Vertebral body height is well maintained. Multilevel facet hypertrophic changes and osteophytic changes are noted. No anterolisthesis is seen. No compression deformity is noted. No acute facet abnormality is seen. Soft tissues and spinal canal: Surrounding soft tissue structures are within normal limits. Upper chest:  Visualized lung apices are within normal limits. Other: None IMPRESSION: CT of the head: Chronic atrophic and ischemic changes without acute abnormality. Mild mucosal thickening in the right maxillary antrum. CT of the cervical spine: Degenerative change without acute abnormality. Electronically Signed   By: Inez Catalina M.D.   On: 05/09/2020 14:38   CT Cervical Spine Wo Contrast  Result Date: 05/09/2020 CLINICAL DATA:  Altered mental status EXAM: CT HEAD WITHOUT CONTRAST CT CERVICAL SPINE WITHOUT CONTRAST TECHNIQUE: Multidetector CT imaging of the head and cervical spine was performed following the standard protocol without intravenous contrast. Multiplanar CT image reconstructions of the cervical spine were also generated. COMPARISON:  11/05/2019 FINDINGS: CT HEAD FINDINGS Brain: No evidence of acute infarction, hemorrhage, hydrocephalus, extra-axial collection or mass lesion/mass effect. Chronic atrophic and ischemic changes are noted. Vascular: No hyperdense vessel or unexpected calcification. Skull: Normal. Negative for fracture or focal lesion. Sinuses/Orbits: Mild mucosal thickening is noted within the right maxillary antrum. Other: None. CT CERVICAL SPINE FINDINGS Alignment: Within normal limits. Skull base and vertebrae: 7 cervical segments are well visualized. Vertebral body height is well maintained. Multilevel facet hypertrophic changes and osteophytic changes are noted. No anterolisthesis is seen. No compression deformity is noted. No acute facet abnormality is seen. Soft tissues and spinal canal: Surrounding soft tissue structures are within normal limits. Upper chest: Visualized lung apices are within normal limits. Other: None IMPRESSION: CT of the head: Chronic atrophic and ischemic changes without acute abnormality. Mild mucosal thickening in the right maxillary antrum. CT of the cervical spine: Degenerative change without acute abnormality. Electronically Signed   By: Inez Catalina M.D.   On:  05/09/2020 14:38   DG Chest Portable 1 View  Result Date: 05/09/2020 CLINICAL DATA:  Altered mental status EXAM: PORTABLE CHEST 1 VIEW COMPARISON:  11/05/2019 FINDINGS: Cardiac shadow is enlarged. Aortic calcifications are noted. Lungs are well aerated bilaterally. No focal infiltrate or sizable effusion is seen. No bony abnormality is noted. IMPRESSION: No acute abnormality seen. Electronically Signed   By: Inez Catalina M.D.   On: 05/09/2020 14:39     Microbiology: Recent Results (from the past 240 hour(s))  Urine culture     Status: None   Collection Time: 05/09/20 12:03 PM   Specimen: Urine, Clean Catch  Result Value Ref Range Status   Specimen Description   Final    URINE, CLEAN CATCH Performed at [facility], [address] [place], [place] [address]    Special Requests   Final    NONE Performed at [facility], [address] [place], [place] [address]    Culture   Final    NO GROWTH Performed at [facility], [place] [address] [place], [place] [address]    Report Status 05/11/2020 FINAL  Final  Resp Panel by RT-PCR (Flu A&B, Covid) Nasopharyngeal Swab     Status: Abnormal   Collection Time: 05/09/20  1:35 PM   Specimen: Nasopharyngeal Swab; Nasopharyngeal(NP) swabs in vial transport medium  Result Value Ref Range Status   SARS Coronavirus 2 by RT PCR POSITIVE (A) NEGATIVE Final    Comment: RESULT CALLED TO, READ BACK BY AND VERIFIED WITH:  NICOLE DAVIS,RN @1449  05/09/2020 KAY (NOTE) SARS-CoV-2 target nucleic acids are DETECTED.  The SARS-CoV-2 RNA is generally detectable in upper respiratory specimens during the acute phase of infection. Positive results are indicative of the presence of the identified virus, but do not rule out bacterial infection or co-infection with other pathogens not detected by the test. Clinical correlation with patient history and other diagnostic information is necessary to determine patient infection status. The expected result is  Negative.  Fact Sheet for Patients: EntrepreneurPulse.com.au  Fact Sheet for Healthcare Providers: IncredibleEmployment.be  This test is not yet approved or cleared by the [place] FDA and  has been authorized for detection and/or diagnosis of SARS-CoV-2 by FDA under an Emergency Use Authorization (EUA).  This EUA will remain in effect (meaning this test can  be used) for the duration of  the COVID-19 declaration under Section 564(b)(1) of the Act, 21 U.S.C. section 360bbb-3(b)(1), unless the authorization is terminated or revoked sooner.     Influenza A by PCR NEGATIVE NEGATIVE Final   Influenza B by PCR NEGATIVE NEGATIVE Final    Comment: (NOTE) The Xpert Xpress SARS-CoV-2/FLU/RSV plus assay is intended as an aid in the diagnosis of influenza from Nasopharyngeal swab specimens and should not be used as a sole basis for treatment. Nasal washings and aspirates are unacceptable for Xpert Xpress SARS-CoV-2/FLU/RSV testing.  Fact Sheet for Patients: EntrepreneurPulse.com.au  Fact Sheet for Healthcare Providers: IncredibleEmployment.be  This test is not yet approved or cleared by the [place] FDA and has been authorized for detection and/or diagnosis of SARS-CoV-2 by FDA under an Emergency Use Authorization (EUA). This EUA will remain in effect (meaning this test can be used) for the duration of the COVID-19 declaration under Section 564(b)(1) of the Act, 21 U.S.C. section 360bbb-3(b)(1), unless the authorization is terminated or revoked.  Performed at [facility], [address] [place], [place] [address]   Blood culture (routine x 2)     Status: None (Preliminary result)   Collection Time: 05/09/20  4:01 PM   Specimen: Right Antecubital; Blood  Result Value Ref Range Status   Specimen Description   Final    RIGHT ANTECUBITAL BOTTLES DRAWN AEROBIC AND ANAEROBIC   Special Requests Blood  Culture adequate volume  Final   Culture   Final    NO GROWTH < 24 HOURS Performed at [facility], [address], [place], [place] [address]    Report Status PENDING  Incomplete  Blood culture (routine x 2)     Status: None (Preliminary result)   Collection Time: 05/09/20  4:36 PM   Specimen: BLOOD RIGHT HAND  Result Value Ref Range Status   Specimen Description   Final    BLOOD RIGHT HAND BOTTLES DRAWN AEROBIC AND ANAEROBIC   Special Requests Blood Culture adequate volume  Final   Culture   Final    NO GROWTH < 24 HOURS Performed at [facility], [address] [place], [place] [address]    Report Status PENDING  Incomplete     Labs: Basic Metabolic Panel: Recent Labs  Lab 05/09/20 1219 05/10/20 0654 05/10/20 1249 05/11/20 0442  NA 133*  --   --  136  K 3.7  --   --  3.9  CL 99  --   --  101  CO2 25  --   --  23  GLUCOSE 181*  --  514* 241*  BUN 19  --   --  27*  CREATININE 0.70  --   --  0.71  CALCIUM 8.9  --   --  9.3  MG  --  1.2*  --  1.8   Liver Function Tests: Recent Labs  Lab 05/09/20 1219 05/11/20 0442  AST 16 15  ALT 13 13  ALKPHOS 93 83  BILITOT 0.4 0.4  PROT 6.8 6.5  ALBUMIN 3.6 3.4*   No results for input(s): LIPASE, AMYLASE in the last 168 hours. No results for input(s): AMMONIA in the last 168 hours. CBC: Recent Labs  Lab 05/09/20 1219 05/10/20 0654 05/11/20 0442  WBC 8.1 5.5 14.3*  NEUTROABS 5.4 4.4 12.6*  HGB 11.8* 11.1* 11.8*  HCT 35.9* 34.9* 35.4*  MCV 95.0 96.7 93.7  PLT 163 164 209   Cardiac Enzymes: No results for input(s): CKTOTAL, CKMB, CKMBINDEX, TROPONINI in the last 168 hours. BNP: Invalid input(s): POCBNP CBG: Recent Labs  Lab 05/10/20 0750 05/10/20 1207 05/10/20 1647 05/10/20 2104 05/11/20 0754  GLUCAP 247* 457* 362* 276* 248*    Time coordinating discharge:  36 minutes  Signed:  Orson Eva, DO [facility] Pager: [phone] 05/11/2020, 10:42 AM

## 2020-05-11 NOTE — Evaluation (Signed)
Physical Therapy Evaluation Patient Details Name: [name] MRN: [id number] DOB: 02-24-1928 Today's Date: 05/11/2020   History of Present Illness  [name] is a 86 y.o. female with a history of coronary artery disease with ischemic cardiomyopathy and history of NSTEMI and multiple drug-eluting stents, history of stroke, dementia, GERD, hypertension, type 2 diabetes.  Patient presents with confusion, urinary incontinence, fever, cough.  Patient is an unreliable historian and history is obtained by the chart.  Her symptoms started last night and have been gradually worsening.  She was brought to the emergency department by EMS after family called EMS due to her symptoms.  No sick contacts.  Patient is unable to explain why she is here.    Clinical Impression  Patient functioning near baseline for functional mobility and gait, unsteady on feet and requires assistance to prevent loss of balance when making turns using RW.  Patient put back to bed after therapy.  Plan:  Patient to be discharged home today and discharged from physical therapy to care of nursing for ambulation as tolerated for length of stay.     Follow Up Recommendations Home health PT;Supervision for mobility/OOB;Supervision - Intermittent    Equipment Recommendations  None recommended by PT    Recommendations for Other Services       Precautions / Restrictions Precautions Precautions: Fall Restrictions Weight Bearing Restrictions: No      Mobility  Bed Mobility Overal bed mobility: Needs Assistance Bed Mobility: Supine to Sit;Sit to Supine     Supine to sit: Min guard Sit to supine: Min guard   General bed mobility comments: increased time, labored movement    Transfers Overall transfer level: Needs assistance Equipment used: Rolling walker (2 wheeled);None Transfers: Sit to/from [employer] to Stand: Min assist Stand pivot transfers: Min assist       General transfer comment:  has to lean on nearby objects for support when not using AD  Ambulation/Gait Ambulation/Gait assistance: Min assist Gait Distance (Feet): 35 Feet Assistive device: Rolling walker (2 wheeled) Gait Pattern/deviations: Decreased step length - left;Decreased stance time - right;Decreased stride length Gait velocity: decreased   General Gait Details: slow labored unsteady cadence, difficulty making turns, occasional stumbling requiring tactile assistance to avoid loss of balance  Stairs            Wheelchair Mobility    Modified Rankin (Stroke Patients Only)       Balance Overall balance assessment: Needs assistance Sitting-balance support: Feet supported Sitting balance-Leahy Scale: Good Sitting balance - Comments: seated at EOB   Standing balance support: During functional activity;Bilateral upper extremity supported Standing balance-Leahy Scale: Fair Standing balance comment: using RW                             Pertinent Vitals/Pain Pain Assessment: No/denies pain    Home Living Family/patient expects to be discharged to:: Private residence Living Arrangements: Spouse/significant other Available Help at Discharge: Family;Available 24 hours/day Type of Home: House Home Access: Stairs to enter Entrance Stairs-Rails: None Entrance Stairs-Number of Steps: 1 step onto porch, and 2 steps into house Home Layout: One level Home Equipment: Environmental consultant - 2 wheels;Wheelchair - Brewing technologist      Prior Function Level of Independence: Needs assistance   Gait / Transfers Assistance Needed: household ambulator using RW  ADL's / Homemaking Assistance Needed: Daughter assisting with ADLs as needed, has caregivers 24/7 per patient  Hand Dominance   Dominant Hand: Right    Extremity/Trunk Assessment   Upper Extremity Assessment Upper Extremity Assessment: Generalized weakness    Lower Extremity Assessment Lower Extremity Assessment: Generalized  weakness    Cervical / Trunk Assessment Cervical / Trunk Assessment: Kyphotic  Communication   Communication: No difficulties  Cognition Arousal/Alertness: Awake/alert Behavior During Therapy: WFL for tasks assessed/performed Overall Cognitive Status: Within Functional Limits for tasks assessed                                        General Comments      Exercises     Assessment/Plan    PT Assessment All further PT needs can be met in the next venue of care  PT Problem List Decreased strength;Decreased activity tolerance;Decreased balance;Decreased mobility       PT Treatment Interventions      PT Goals (Current goals can be found in the Care Plan section)  Acute Rehab PT Goals Patient Stated Goal: return home with family and caregivers to assist PT Goal Formulation: With patient Time For Goal Achievement: 05/11/20 Potential to Achieve Goals: Good    Frequency     Barriers to discharge        Co-evaluation               AM-PAC PT "6 Clicks" Mobility  Outcome Measure Help needed turning from your back to your side while in a flat bed without using bedrails?: None Help needed moving from lying on your back to sitting on the side of a flat bed without using bedrails?: A Little Help needed moving to and from a bed to a chair (including a wheelchair)?: A Little Help needed standing up from a chair using your arms (e.g., wheelchair or bedside chair)?: A Little Help needed to walk in hospital room?: A Lot Help needed climbing 3-5 steps with a railing? : A Lot 6 Click Score: 17    End of Session   Activity Tolerance: Patient tolerated treatment well;Patient limited by fatigue Patient left: in bed;with call bell/phone within reach Nurse Communication: Mobility status PT Visit Diagnosis: Unsteadiness on feet (R26.81);Other abnormalities of gait and mobility (R26.89);Muscle weakness (generalized) (M62.81)    Time: 9924-2683 PT Time Calculation  (min) (ACUTE ONLY): 22 min   Charges:   PT Evaluation $PT Eval Moderate Complexity: 1 Mod PT Treatments $Therapeutic Activity: 8-22 mins        2:17 PM, 05/11/20 Lonell Grandchild, MPT Physical Therapist with [facility] 336 [phone] office [phone] mobile phone

## 2020-05-12 ENCOUNTER — Ambulatory Visit (HOSPITAL_COMMUNITY)
Admit: 2020-05-12 | Discharge: 2020-05-12 | Disposition: A | Discharge status: Home / Self Care | Payer: Medicare Other | Attending: Pulmonary Disease | Admitting: Pulmonary Disease

## 2020-05-12 DIAGNOSIS — U071 COVID-19: Secondary | ICD-10-CM | POA: Diagnosis not present

## 2020-05-12 MED ORDER — EPINEPHRINE 0.3 MG/0.3ML IJ SOAJ: 0.3000 mg | Freq: Once | INTRAMUSCULAR | Status: DC | PRN

## 2020-05-12 MED ORDER — METHYLPREDNISOLONE SODIUM SUCC 125 MG IJ SOLR: 125.0000 mg | Freq: Once | INTRAMUSCULAR | Status: DC | PRN

## 2020-05-12 MED ORDER — FAMOTIDINE IN NACL 20-0.9 MG/50ML-% IV SOLN: 20.0000 mg | Freq: Once | INTRAVENOUS | Status: DC | PRN

## 2020-05-12 MED ORDER — DIPHENHYDRAMINE HCL 50 MG/ML IJ SOLN: 50.0000 mg | Freq: Once | INTRAMUSCULAR | Status: DC | PRN

## 2020-05-12 MED ORDER — SODIUM CHLORIDE 0.9 % IV SOLN
100.0000 mg | Freq: Once | INTRAVENOUS | Status: AC
  Administered 2020-05-12: 100 mg via INTRAVENOUS

## 2020-05-12 MED ORDER — SODIUM CHLORIDE 0.9 % IV SOLN: INTRAVENOUS | Status: DC | PRN

## 2020-05-12 MED ORDER — ALBUTEROL SULFATE HFA 108 (90 BASE) MCG/ACT IN AERS: 2.0000 | INHALATION_SPRAY | Freq: Once | RESPIRATORY_TRACT | Status: DC | PRN

## 2020-05-12 NOTE — Discharge Instructions (Signed)
10 Things You Can Do to Manage Your COVID-19 Symptoms at Home If you have possible or confirmed COVID-19: 1. Stay home from work and school. And stay away from other public places. If you must go out, avoid using any kind of public transportation, ridesharing, or taxis. 2. Monitor your symptoms carefully. If your symptoms get worse, call your healthcare provider immediately. 3. Get rest and stay hydrated. 4. If you have a medical appointment, call the healthcare provider ahead of time and tell them that you have or may have COVID-19. 5. For medical emergencies, call 911 and notify the dispatch personnel that you have or may have COVID-19. 6. Cover your cough and sneezes with a tissue or use the inside of your elbow. 7. Wash your hands often with soap and water for at least 20 seconds or clean your hands with an alcohol-based hand sanitizer that contains at least 60% alcohol. 8. As much as possible, stay in a specific room and away from other people in your home. Also, you should use a separate bathroom, if available. If you need to be around other people in or outside of the home, wear a mask. 9. Avoid sharing personal items with other people in your household, like dishes, towels, and bedding. 10. Clean all surfaces that are touched often, like counters, tabletops, and doorknobs. Use household cleaning sprays or wipes according to the label instructions. cdc.gov/coronavirus 12/04/2018 This information is not intended to replace advice given to you by your health care provider. Make sure you discuss any questions you have with your health care provider. Document Revised: 05/08/2019 Document Reviewed: 05/08/2019 Elsevier Patient Education  2020 Elsevier Inc.  If you have any questions or concerns after the infusion please call the Advanced Practice Provider on call at [phone]. This number is ONLY intended for your use regarding questions or concerns about the infusion post-treatment  side-effects.  Please do not provide this number to others for use. For return to work notes please contact your primary care provider.   If someone you know is interested in receiving treatment please have them call the COVID hotline at [phone].    

## 2020-05-12 NOTE — Progress Notes (Signed)
  Diagnosis: COVID-19  Physician: Dr. Joya Gaskins  Procedure: [facility] Med: remdesivir infusion - Provided patient with remdesivir fact sheet for patients, parents and caregivers prior to infusion.  Complications: No immediate complications noted.  Discharge: Discharged home   [name] 05/12/2020

## 2020-05-12 NOTE — Progress Notes (Signed)
Patient reviewed Fact Sheet for Patients, Parents, and Caregivers for Emergency Use Authorization (EUA) of remdesivir for the Treatment of Coronavirus. Patient also reviewed and is agreeable to the estimated cost of treatment. Patient is agreeable to proceed.    

## 2020-05-13 ENCOUNTER — Ambulatory Visit (HOSPITAL_COMMUNITY)
Admit: 2020-05-13 | Discharge: 2020-05-13 | Disposition: A | Discharge status: Home / Self Care | Payer: Medicare Other | Attending: Pulmonary Disease | Admitting: Pulmonary Disease

## 2020-05-13 DIAGNOSIS — U071 COVID-19: Secondary | ICD-10-CM | POA: Diagnosis present

## 2020-05-13 DIAGNOSIS — J1289 Other viral pneumonia: Secondary | ICD-10-CM | POA: Diagnosis not present

## 2020-05-13 MED ORDER — DIPHENHYDRAMINE HCL 50 MG/ML IJ SOLN: 50.0000 mg | Freq: Once | INTRAMUSCULAR | Status: DC | PRN

## 2020-05-13 MED ORDER — ALBUTEROL SULFATE HFA 108 (90 BASE) MCG/ACT IN AERS: 2.0000 | INHALATION_SPRAY | Freq: Once | RESPIRATORY_TRACT | Status: DC | PRN

## 2020-05-13 MED ORDER — SODIUM CHLORIDE 0.9 % IV SOLN: INTRAVENOUS | Status: DC | PRN

## 2020-05-13 MED ORDER — METHYLPREDNISOLONE SODIUM SUCC 125 MG IJ SOLR: 125.0000 mg | Freq: Once | INTRAMUSCULAR | Status: DC | PRN

## 2020-05-13 MED ORDER — EPINEPHRINE 0.3 MG/0.3ML IJ SOAJ: 0.3000 mg | Freq: Once | INTRAMUSCULAR | Status: DC | PRN

## 2020-05-13 MED ORDER — FAMOTIDINE IN NACL 20-0.9 MG/50ML-% IV SOLN: 20.0000 mg | Freq: Once | INTRAVENOUS | Status: DC | PRN

## 2020-05-13 MED ORDER — SODIUM CHLORIDE 0.9 % IV SOLN
100.0000 mg | Freq: Once | INTRAVENOUS | Status: AC
  Administered 2020-05-13: 100 mg via INTRAVENOUS

## 2020-05-13 NOTE — Progress Notes (Signed)
  Diagnosis: COVID-19  Physician: Dr. Joya Gaskins  Procedure: [facility] Med: remdesivir infusion - Provided patient with remdesivir fact sheet for patients, parents and caregivers prior to infusion.  Complications: No immediate complications noted.  Discharge: Discharged home   [name] 05/13/2020

## 2020-05-13 NOTE — Progress Notes (Signed)
Patient reviewed Fact Sheet for Patients, Parents, and Caregivers for Emergency Use Authorization (EUA) of remdesivir for the Treatment of Coronavirus. Patient also reviewed and is agreeable to the estimated cost of treatment. Patient is agreeable to proceed.    

## 2020-05-13 NOTE — Discharge Instructions (Signed)
10 Things You Can Do to Manage Your COVID-19 Symptoms at Home If you have possible or confirmed COVID-19: 1. Stay home from work and school. And stay away from other public places. If you must go out, avoid using any kind of public transportation, ridesharing, or taxis. 2. Monitor your symptoms carefully. If your symptoms get worse, call your healthcare provider immediately. 3. Get rest and stay hydrated. 4. If you have a medical appointment, call the healthcare provider ahead of time and tell them that you have or may have COVID-19. 5. For medical emergencies, call 911 and notify the dispatch personnel that you have or may have COVID-19. 6. Cover your cough and sneezes with a tissue or use the inside of your elbow. 7. Wash your hands often with soap and water for at least 20 seconds or clean your hands with an alcohol-based hand sanitizer that contains at least 60% alcohol. 8. As much as possible, stay in a specific room and away from other people in your home. Also, you should use a separate bathroom, if available. If you need to be around other people in or outside of the home, wear a mask. 9. Avoid sharing personal items with other people in your household, like dishes, towels, and bedding. 10. Clean all surfaces that are touched often, like counters, tabletops, and doorknobs. Use household cleaning sprays or wipes according to the label instructions. cdc.gov/coronavirus 12/04/2018 This information is not intended to replace advice given to you by your health care provider. Make sure you discuss any questions you have with your health care provider. Document Revised: 05/08/2019 Document Reviewed: 05/08/2019 Elsevier Patient Education  2020 Elsevier Inc.  If you have any questions or concerns after the infusion please call the Advanced Practice Provider on call at [phone]. This number is ONLY intended for your use regarding questions or concerns about the infusion post-treatment  side-effects.  Please do not provide this number to others for use. For return to work notes please contact your primary care provider.   If someone you know is interested in receiving treatment please have them call the COVID hotline at [phone].    

## 2020-05-14 LAB — CULTURE, BLOOD (ROUTINE X 2)
Culture: NO GROWTH
Culture: NO GROWTH
Special Requests: ADEQUATE
Special Requests: ADEQUATE

## 2020-10-27 ENCOUNTER — Emergency Department (HOSPITAL_COMMUNITY): Payer: Medicare Other

## 2020-10-27 ENCOUNTER — Other Ambulatory Visit: Payer: Self-pay

## 2020-10-27 ENCOUNTER — Emergency Department (HOSPITAL_COMMUNITY)
Admission: EM | Admit: 2020-10-27 | Discharge: 2020-10-28 | Disposition: A | Discharge status: Home / Self Care | Payer: Medicare Other | Attending: Emergency Medicine | Admitting: Emergency Medicine

## 2020-10-27 DIAGNOSIS — Z7982 Long term (current) use of aspirin: Secondary | ICD-10-CM | POA: Diagnosis not present

## 2020-10-27 DIAGNOSIS — I2510 Atherosclerotic heart disease of native coronary artery without angina pectoris: Secondary | ICD-10-CM | POA: Insufficient documentation

## 2020-10-27 DIAGNOSIS — F0390 Unspecified dementia without behavioral disturbance: Secondary | ICD-10-CM | POA: Insufficient documentation

## 2020-10-27 DIAGNOSIS — I639 Cerebral infarction, unspecified: Secondary | ICD-10-CM

## 2020-10-27 DIAGNOSIS — Z79899 Other long term (current) drug therapy: Secondary | ICD-10-CM | POA: Insufficient documentation

## 2020-10-27 DIAGNOSIS — I4891 Unspecified atrial fibrillation: Secondary | ICD-10-CM | POA: Insufficient documentation

## 2020-10-27 DIAGNOSIS — Z7984 Long term (current) use of oral hypoglycemic drugs: Secondary | ICD-10-CM | POA: Diagnosis not present

## 2020-10-27 DIAGNOSIS — I10 Essential (primary) hypertension: Secondary | ICD-10-CM | POA: Insufficient documentation

## 2020-10-27 DIAGNOSIS — R2990 Unspecified symptoms and signs involving the nervous system: Secondary | ICD-10-CM

## 2020-10-27 DIAGNOSIS — R4182 Altered mental status, unspecified: Secondary | ICD-10-CM | POA: Diagnosis present

## 2020-10-27 DIAGNOSIS — Z87891 Personal history of nicotine dependence: Secondary | ICD-10-CM | POA: Diagnosis not present

## 2020-10-27 DIAGNOSIS — Z8616 Personal history of COVID-19: Secondary | ICD-10-CM | POA: Diagnosis not present

## 2020-10-27 DIAGNOSIS — E119 Type 2 diabetes mellitus without complications: Secondary | ICD-10-CM | POA: Insufficient documentation

## 2020-10-27 DIAGNOSIS — R29818 Other symptoms and signs involving the nervous system: Secondary | ICD-10-CM | POA: Diagnosis not present

## 2020-10-27 DIAGNOSIS — Z7901 Long term (current) use of anticoagulants: Secondary | ICD-10-CM | POA: Diagnosis not present

## 2020-10-27 LAB — CBC WITH DIFFERENTIAL/PLATELET
Abs Immature Granulocytes: 0.02 10*3/uL (ref 0.00–0.07)
Basophils Absolute: 0 10*3/uL (ref 0.0–0.1)
Basophils Relative: 1 %
Eosinophils Absolute: 0.1 10*3/uL (ref 0.0–0.5)
Eosinophils Relative: 2 %
HCT: 39 % (ref 36.0–46.0)
Hemoglobin: 12.9 g/dL (ref 12.0–15.0)
Immature Granulocytes: 0 %
Lymphocytes Relative: 29 %
Lymphs Abs: 2.4 10*3/uL (ref 0.7–4.0)
MCH: 30.6 pg (ref 26.0–34.0)
MCHC: 33.1 g/dL (ref 30.0–36.0)
MCV: 92.6 fL (ref 80.0–100.0)
Monocytes Absolute: 0.8 10*3/uL (ref 0.1–1.0)
Monocytes Relative: 10 %
Neutro Abs: 5 10*3/uL (ref 1.7–7.7)
Neutrophils Relative %: 58 %
Platelets: 164 10*3/uL (ref 150–400)
RBC: 4.21 MIL/uL (ref 3.87–5.11)
RDW: 12.6 % (ref 11.5–15.5)
WBC: 8.4 10*3/uL (ref 4.0–10.5)
nRBC: 0 % (ref 0.0–0.2)

## 2020-10-27 LAB — URINALYSIS, ROUTINE W REFLEX MICROSCOPIC
Bilirubin Urine: NEGATIVE
Glucose, UA: 150 mg/dL — AB
Hgb urine dipstick: NEGATIVE
Ketones, ur: NEGATIVE mg/dL
Leukocytes,Ua: NEGATIVE
Nitrite: NEGATIVE
Protein, ur: NEGATIVE mg/dL
Specific Gravity, Urine: 1.009 (ref 1.005–1.030)
pH: 6 (ref 5.0–8.0)

## 2020-10-27 LAB — BASIC METABOLIC PANEL
Anion gap: 10 (ref 5–15)
BUN: 21 mg/dL (ref 8–23)
CO2: 24 mmol/L (ref 22–32)
Calcium: 8.6 mg/dL — ABNORMAL LOW (ref 8.9–10.3)
Chloride: 102 mmol/L (ref 98–111)
Creatinine, Ser: 0.63 mg/dL (ref 0.44–1.00)
GFR, Estimated: >60 mL/min (ref >60)
Glucose, Bld: 206 mg/dL — ABNORMAL HIGH (ref 70–99)
Potassium: 3.7 mmol/L (ref 3.5–5.1)
Sodium: 136 mmol/L (ref 135–145)

## 2020-10-27 MED ORDER — HYDRALAZINE HCL 25 MG PO TABS
25.0000 mg | ORAL_TABLET | Freq: Once | ORAL | Status: AC
  Administered 2020-10-27: 25 mg via ORAL
  Filled 2020-10-27: qty 1

## 2020-10-27 NOTE — ED Provider Notes (Signed)
[facility] EMERGENCY DEPARTMENT Provider Note   CSN: [id number] Arrival date & time: 10/27/20  1949     History Chief Complaint  Patient presents with  . Altered Mental Status    [name] is a 87 y.o. female.  Presents to ER with concern for altered mental status.  History limited due to patient's dementia.  Additional history obtained from daughter, chart review, EMS report.  Daughter reports that earlier today patient noted not feeling quite right.  This evening someone else at home noted the patient to have difficulty swallowing and seemed like her speech was abnormal.  Also seemed slightly more confused than normal.  They did not notice any seizure activity.  No numbness or weakness was reported, no vision changes reported.  Patient has a history of CAD, CVA, dementia.  She is on Eliquis and baby aspirin. HPI     Past Medical History:  Diagnosis Date  . Allergic rhinitis   . Allergy to IVP dye   . Arthritis    "probably in my hands" (02/26/2018)  . Colonic polyp   . Coronary artery disease    a. cath 02/2018 PCI with DES x1 to the distal RCA and DES x1 to the proximal LAD. b. NSTEMI 09/2018 s/p DES to dRCA and DES to prox LAD just beyond stented segments, residual moderate OM2 disease for medical therapy.  . CVA (cerebral vascular accident) ([place])   . Dementia ([place])   . GERD (gastroesophageal reflux disease)   . History of blood transfusion    "don't remember why or when" (02/26/2018)  . Hyperlipidemia   . Hypertension   . Ischemic cardiomyopathy    a. EF 40-45% by echo 09/2018.  . Moderate aortic stenosis   . Moderate mitral regurgitation   . Nonalcoholic hepatosteatosis   . PAF (paroxysmal atrial fibrillation) ([place])   . TIA (transient ischemic attack) 2000s   "I've had ~ 3" (02/26/2018)  . Type II diabetes mellitus [facility])     Patient Active Problem List   Diagnosis Date Noted  . Acute respiratory failure with hypoxia ([place]) 05/10/2020  .  Pneumonia due to COVID-19 virus 05/09/2020  . Encephalopathy 05/09/2020  . History of stroke 09/06/2018  . PAF (paroxysmal atrial fibrillation) ([place]) 09/06/2018  . Moderate aortic stenosis 09/06/2018  . Moderate mitral regurgitation 09/06/2018  . Disorientation 09/06/2018  . Ischemic cardiomyopathy   . NSTEMI (non-ST elevated myocardial infarction) ([place])   . Arm pain, anterior, left 09/03/2018  . Angina at rest [facility]) 09/03/2018  . Abnormal EKG   . Elevated troponin   . Acute left-sided weakness 05/07/2018  . Nonalcoholic hepatosteatosis 00/17/4944  . Acute CVA (cerebrovascular accident) ([place]) 05/07/2018  . Weakness 05/07/2018  . PFO (patent foramen ovale) 02/07/2011  . Type II diabetes mellitus ([place]) 09/30/2008  . CHANGE IN BOWELS 09/30/2008  . Hyperlipidemia 09/29/2008  . Essential hypertension 09/29/2008  . Coronary atherosclerosis 09/29/2008  . VENTRICULAR HYPERTROPHY, LEFT 09/29/2008  . ALLERGIC RHINITIS, CHRONIC 09/29/2008  . OTHER CHRONIC NONALCOHOLIC LIVER DISEASE 96/75/9163  . LIVER FUNCTION TESTS, ABNORMAL, HX OF 09/29/2008  . COLONIC POLYPS, HX OF 09/29/2008  . GERD (gastroesophageal reflux disease) 09/29/2008    Past Surgical History:  Procedure Laterality Date  . ABDOMINAL HYSTERECTOMY    . APPENDECTOMY  1950s  . CARDIAC CATHETERIZATION  2001   Nonobstructive CAD  . CATARACT EXTRACTION W/ INTRAOCULAR LENS  IMPLANT, BILATERAL Bilateral 2000s  . CORONARY STENT INTERVENTION N/A 02/26/2018   Procedure: CORONARY STENT INTERVENTION;  Surgeon: Martinique, Peter M, MD;  Location: [place] CV LAB;  Service: Cardiovascular;  Laterality: N/A;  rca   . CORONARY STENT INTERVENTION N/A 09/05/2018   Procedure: CORONARY STENT INTERVENTION;  Surgeon: Burnell Blanks, MD;  Location: [place] CV LAB;  Service: Cardiovascular;  Laterality: N/A;  . LEFT HEART CATH AND CORONARY ANGIOGRAPHY N/A 02/26/2018   Procedure: LEFT HEART CATH AND CORONARY ANGIOGRAPHY;  Surgeon: Martinique,  Peter M, MD;  Location: [place] CV LAB;  Service: Cardiovascular;  Laterality: N/A;  . LEFT HEART CATH AND CORONARY ANGIOGRAPHY N/A 09/05/2018   Procedure: LEFT HEART CATH AND CORONARY ANGIOGRAPHY;  Surgeon: Burnell Blanks, MD;  Location: [place] CV LAB;  Service: Cardiovascular;  Laterality: N/A;  . TONSILLECTOMY  1943     OB History   No obstetric history on file.     Family History  Problem Relation Age of Onset  . Heart failure Mother   . Prostate cancer Father   . Prostate cancer Brother     Social History   Tobacco Use  . Smoking status: Former Smoker    Packs/day: 2.00    Years: 40.00    Pack years: 80.00    Types: Cigarettes    Quit date: 06/05/1992    Years since quitting: 28.4  . Smokeless tobacco: Former Systems developer    Types: Chew    Quit date: 12/03/1992  . Tobacco comment: used tobacco to help quit smoking  Vaping Use  . Vaping Use: Never used  Substance Use Topics  . Alcohol use: Never  . Drug use: Never    Home Medications Prior to Admission medications   Medication Sig Start Date End Date Taking? Authorizing Provider  acetaminophen (TYLENOL) 500 MG tablet Take 500 mg by mouth every 6 (six) hours as needed.    [provider]  aspirin EC 81 MG tablet Take 81 mg by mouth daily.    [provider]  carboxymethylcellulose (REFRESH) 1 % ophthalmic solution Place 1 drop into both eyes 2 (two) times daily as needed (dry eyes).     [provider]  dexamethasone (DECADRON) 6 MG tablet Take 1 tablet (6 mg total) by mouth daily. 05/11/20   Orson Eva, MD  ELIQUIS 5 MG TABS tablet Take 5 mg by mouth 2 (two) times daily.  08/12/18   [provider]  fexofenadine (ALLEGRA) 180 MG tablet Take 180 mg by mouth daily.      [provider]  insulin detemir (LEVEMIR) 100 UNIT/ML injection Inject 0.1 mLs (10 Units total) into the skin 2 (two) times daily. X 7 days 05/11/20   Orson Eva, MD  Insulin Syringe-Needle U-100 31G X  1/4" 1 ML MISC Use to dispense insulin as directed 05/11/20   Tat, Shanon Brow, MD  metFORMIN (GLUCOPHAGE-XR) 500 MG 24 hr tablet Take 1 tablet (500 mg total) by mouth 2 (two) times daily with a meal. 05/10/18   Johnson, Clanford L, MD  metoprolol succinate (TOPROL-XL) 50 MG 24 hr tablet Take 0.5 tablets (25 mg total) by mouth daily. 05/12/20   Orson Eva, MD  nitroGLYCERIN (NITROSTAT) 0.4 MG SL tablet Place 0.4 mg under the tongue every 5 (five) minutes as needed for chest pain.    de Stanford Scotland, MD  pantoprazole (PROTONIX) 40 MG tablet Take 1 tablet (40 mg total) by mouth daily. 04/02/18   Strader, Fransisco Hertz, PA-C  phenazopyridine (PYRIDIUM) 95 MG tablet Take 95 mg by mouth 3 (three) times daily as needed  for pain.    [provider]  rosuvastatin (CRESTOR) 20 MG tablet Take 1 tablet (20 mg total) by mouth every evening. 09/06/18   Herminio Commons, MD  ursodiol (ACTIGALL) 300 MG capsule Take 300 mg by mouth 2 (two) times daily.     [provider]    Allergies    Ivp dye [iodinated diagnostic agents], Sulfonamide derivatives, Lipitor [atorvastatin calcium], Zetia [ezetimibe], and Penicillins  Review of Systems   Review of Systems  Unable to perform ROS: Dementia    Physical Exam Updated Vital Signs BP (!) 203/98   Pulse 100   Temp (!) 97.3 F (36.3 C) (Oral)   Resp 15   Ht 5' 2"  (1.575 m)   Wt 72.6 kg   SpO2 95%   BMI 29.26 kg/m   Physical Exam Vitals and nursing note reviewed.  Constitutional:      General: She is not in acute distress.    Appearance: She is well-developed.  HENT:     Head: Normocephalic and atraumatic.  Eyes:     Conjunctiva/sclera: Conjunctivae normal.  Cardiovascular:     Rate and Rhythm: Normal rate and regular rhythm.     Heart sounds: No murmur heard.   Pulmonary:     Effort: Pulmonary effort is normal. No respiratory distress.     Breath sounds: Normal breath sounds.  Abdominal:     Palpations: Abdomen is soft.     Tenderness:  There is no abdominal tenderness.  Musculoskeletal:     Cervical back: Neck supple.  Skin:    General: Skin is warm and dry.  Neurological:     Mental Status: She is alert.     Comments: Alert, oriented to person but not time or exact location, pleasantly confused, readily following commands, normal strength in upper and lower extremities, normal sensation to light touch intact in upper and lower extremities     ED Results / Procedures / Treatments   Labs (all labs ordered are listed, but only abnormal results are displayed) Labs Reviewed  BASIC METABOLIC PANEL - Abnormal; Notable for the following components:      Result Value   Glucose, Bld 206 (*)    Calcium 8.6 (*)    All other components within normal limits  URINALYSIS, ROUTINE W REFLEX MICROSCOPIC - Abnormal; Notable for the following components:   Color, Urine COLORLESS (*)    Glucose, UA 150 (*)    All other components within normal limits  CBC WITH DIFFERENTIAL/PLATELET    EKG EKG Interpretation  Date/Time:  Wednesday Oct 27 2020 20:09:15 EDT Ventricular Rate:  95 PR Interval:  147 QRS Duration: 96 QT Interval:  368 QTC Calculation: 463 R Axis:   -41 Text Interpretation: Sinus tachycardia Multiform ventricular premature complexes Left axis deviation Confirmed by Madalyn Rob [phone]) on 10/27/2020 8:17:20 PM   Radiology CT Head Wo Contrast  Result Date: 10/27/2020 CLINICAL DATA:  Delirium, altered mental status EXAM: CT HEAD WITHOUT CONTRAST TECHNIQUE: Contiguous axial images were obtained from the base of the skull through the vertex without intravenous contrast. COMPARISON:  09/30/2020 FINDINGS: Brain: Stable region encephalomalacia in the left insula. Additional lacunar type infarcts seen throughout the bilateral basal ganglia left pons and bilateral cerebellar hemispheres. Background of diffuse patchy and confluent areas of white matter hypoattenuation compatible with microvascular angiopathy are similar to  comparison priors. No evidence of acute infarction, hemorrhage, hydrocephalus, extra-axial collection, visible mass lesion or mass effect. Symmetric prominence of the ventricles, cisterns and sulci  compatible with moderate parenchymal volume loss. Vascular: Atherosclerotic calcification of the carotid siphons and intradural vertebral arteries. No hyperdense vessel. Skull: No calvarial fracture or suspicious osseous lesion. Mild bony demineralization. No scalp swelling or hematoma. Sinuses/Orbits: Paranasal sinuses and mastoid air cells are predominantly clear. Prior lens extractions and senescent scleral plaque. Orbits are otherwise unremarkable. Other: None. IMPRESSION: No acute intracranial abnormality. Stable encephalomalacia in the left insula. Additional remote lacunar type infarcts in the bilateral basal ganglia, left pons and cerebellar hemispheres. Background of extensive chronic microvascular angiopathy, moderate parenchymal volume loss and intracranial atherosclerosis. Electronically Signed   By: Lovena Le M.D.   On: 10/27/2020 20:42   DG Chest Portable 1 View  Result Date: 10/27/2020 CLINICAL DATA:  Altered mental status. EXAM: PORTABLE CHEST 1 VIEW COMPARISON:  May 09, 2020. FINDINGS: The heart size and mediastinal contours are within normal limits. Right lung is clear. Minimal left basilar subsegmental atelectasis or scarring is noted. The visualized skeletal structures are unremarkable. IMPRESSION: Minimal left basilar subsegmental atelectasis or scarring. Aortic Atherosclerosis (ICD10-I70.0). Electronically Signed   By: Marijo Conception M.D.   On: 10/27/2020 20:24    Procedures Procedures   Medications Ordered in ED Medications  hydrALAZINE (APRESOLINE) tablet 25 mg (25 mg Oral Given 10/27/20 2155)    ED Course  I have reviewed the triage vital signs and the nursing notes.  Pertinent labs & imaging results that were available during my care of the patient were reviewed by me and  considered in my medical decision making (see chart for details).  Clinical Course as of 10/27/20 2334  Wed Oct 27, 2020  2018 Additional history obtained from daughter Redmond Pulling [RD]  2221 Discussed the case with Sal [RD]    Clinical Course User Index [RD] Lucrezia Starch, MD   MDM Rules/Calculators/A&P                         87 year old lady presents to ER with concern for difficulty swallowing and increased confusion.  Initially on exam patient seemed to be slightly lethargic but steadily improved.  Based on my conversation with daughter, suspect patient is now at baseline.  She did not have any focal neurologic deficits and subsequently no code stroke was called.  CT head negative.  Basic labs stable, UA negative for infection.  I reviewed the case with on-call for neurology, Dr. Milas Gain given concern for possbility of stroke or TIA. He recommended checking MRI but if negative for acute stroke then okay for discharge home. Patient already of ASA and AC. Discussed further with daughter. She will come to bedside.   While awaiting MRI, signed out to oncoming team. Dispo pending reassessment and MRI.    Final Clinical Impression(s) / ED Diagnoses Final diagnoses:  Episode of transient neurologic symptoms    Rx / DC Orders ED Discharge Orders    None       Lucrezia Starch, MD 10/27/20 2334

## 2020-10-27 NOTE — ED Notes (Signed)
Patient transported to MRI 

## 2020-10-27 NOTE — ED Triage Notes (Signed)
BIB [facility] from home. Taking meds at home, family noticed she couldn't swallow and wouldn't follow commands. NIH 0. EMS states that pt has gradually starting to follow commands more.  Hx: strokes, dementia.   154/88 94 heart rate 98%  264 CBG  20 L AC

## 2020-10-27 NOTE — Discharge Instructions (Addendum)
Follow-up with your primary care doctor.  If you have any change in your speech, confusion, numbness, weakness, come back to ER for reassessment.

## 2020-10-27 NOTE — ED Notes (Signed)
Daughter number provided to MRI.

## 2020-10-28 ENCOUNTER — Encounter (HOSPITAL_COMMUNITY): Payer: Self-pay | Admitting: Emergency Medicine

## 2020-10-28 NOTE — ED Provider Notes (Signed)
324 Case d/w with Dr. Lorrin Goodell of neurology.  MRI reviewed. Patient is on maximal therapy.  No additional intervention at this time.  Patient can follow up with her PMD as outpatient.     Ana Vanderberg, MD 10/28/20 [phone]

## 2020-11-17 NOTE — Progress Notes (Signed)
Cardiology Office Note    Date:  11/18/2020   ID:  [name], DOB 1927/12/22, MRN [id number]  PCP:  Ana Rio, MD  Cardiologist: Previously Ana Sable, MD (Inactive)  --> Needs to switch to new MD  Chief Complaint  Patient presents with   Follow-up    6 month visit     History of Present Illness:    [name] is a 87 y.o. female with past medical history of CAD (s/p DES to distal RCA and DES to proximal LAD in 02/2018, DES to mid-LAD and DES to dRCA in 09/2018), ischemic cardiomyopathy (EF 40-45% by echo in 09/2018, normalized to 55-60% by repeat echo in 09/2019), moderate AS, PAF, HTN, HLD, Type 2 DM, and prior CVA (occurring in 05/2018 with repeat CVA in 08/2018) and dementia who presents to the office today for 56-monthfollow-up.  She was last examined by myself in 03/2020 and reported staying at home but had 24/7 caregivers. Was using a walker for ambulation and denied any recent anginal symptoms. She was continued on her current medical therapy at that time.  [facility] the [facility] in the setting of COVID-19 pneumonia. She was treated with Remdesivir along with IV steroids and discharged home with Dexamethasone. [facility] and Brain MRI showed an 8 mm focus involving the subcortical white matter of the posterior right frontal lobe which was suspicious for a subacute small vessel ischemia. Given that her symptoms rapidly improved and she was already on medical therapy, Neurology did not recommend admission at that time.  In talking with the patient and her son today, she reports overall doing well from a cardiac perspective since her last visit. She ambulates with a walker around the house and denies any chest pain or dyspnea on exertion with this. No recent orthopnea, PND or pitting edema. She did have a mechanical fall in 10/2020  leading to ED evaluation but denies any repeat falls since.   Past Medical History:  Diagnosis Date   Allergic rhinitis    Allergy to IVP dye    Arthritis    "probably in my hands" (02/26/2018)   Colonic polyp    Coronary artery disease    a. cath 02/2018 PCI with DES x1 to the distal RCA and DES x1 to the proximal LAD. b. NSTEMI 09/2018 s/p DES to dRCA and DES to prox LAD just beyond stented segments, residual moderate OM2 disease for medical therapy.   CVA (cerebral vascular accident) ([facility]    D[place]    GERD (gastroesophageal reflux disease)    History of blood transfusion    "don't remember why or when" (02/26/2018)   Hyperlipidemia    Hypertension    Ischemic cardiomyopathy    a. EF 40-45% by echo 09/2018.   Moderate aortic stenosis    Moderate mitral regurgitation    Nonalcoholic hepatosteatosis    PAF (paroxysmal atrial fibrillation) (HCC)    TIA (transient ischemic attack) 2000s   "I've had ~ 3" (02/26/2018)   Type II diabetes mellitus ([facility]     Past Surgical History:  Procedure Laterality Date   ABDOMINAL HYSTERECTOMY     APPENDECTOMY  1950s   CARDIAC CATHETERIZATION  2001   Nonobstructive CAD   CATARACT EXTRACTION W/ INTRAOCULAR LENS  IMPLANT, BILATERAL Bilateral 2000s   CORONARY STENT INTERVENTION N/A 02/26/2018   Procedure:  CORONARY STENT INTERVENTION;  Surgeon: Martinique, Peter M, MD;  Location: [place] [facility];  Service: Cardiovascular;  Laterality: N/A;  rca    CORONARY STENT INTERVENTION N/A 09/05/2018   Procedure: CORONARY STENT INTERVENTION;  Surgeon: Burnell Blanks, MD;  Location: [place] [facility];  Service: Cardiovascular;  Laterality: N/A;   LEFT HEART CATH AND CORONARY ANGIOGRAPHY N/A 02/26/2018   Procedure: LEFT HEART CATH AND CORONARY ANGIOGRAPHY;  Surgeon: Martinique, Peter M, MD;  Location: [place] [facility];  Service: Cardiovascular;  Laterality: N/A;   LEFT HEART CATH AND CORONARY ANGIOGRAPHY N/A 09/05/2018   Procedure: LEFT HEART CATH AND  CORONARY ANGIOGRAPHY;  Surgeon: Burnell Blanks, MD;  Location: [place] [facility];  Service: Cardiovascular;  Laterality: N/A;   TONSILLECTOMY  1943    Current Medications: Outpatient Medications Prior to Visit  Medication Sig Dispense Refill   acetaminophen (TYLENOL) 500 MG tablet Take 500 mg by mouth every 6 (six) hours as needed.     aspirin EC 81 MG tablet Take 81 mg by mouth daily.     carboxymethylcellulose 1 % ophthalmic solution Place 1 drop into both eyes 2 (two) times daily as needed (dry eyes).      dexamethasone (DECADRON) 6 MG tablet Take 1 tablet (6 mg total) by mouth daily. 7 tablet 0   ELIQUIS 5 MG TABS tablet Take 5 mg by mouth 2 (two) times daily.      fexofenadine (ALLEGRA) 180 MG tablet Take 180 mg by mouth daily.       insulin detemir (LEVEMIR) 100 UNIT/ML injection Inject 0.1 mLs (10 Units total) into the skin 2 (two) times daily. X 7 days 10 mL 0   Insulin Syringe-Needle U-100 31G X 1/4" 1 ML MISC Use to dispense insulin as directed 100 each 0   metFORMIN (GLUCOPHAGE-XR) 500 MG 24 hr tablet Take 1 tablet (500 mg total) by mouth 2 (two) times daily with a meal.     metoprolol succinate (TOPROL-XL) 50 MG 24 hr tablet Take 0.5 tablets (25 mg total) by mouth daily. 30 tablet 1   nitroGLYCERIN (NITROSTAT) 0.4 MG SL tablet Place 0.4 mg under the tongue every 5 (five) minutes as needed for chest pain.     pantoprazole (PROTONIX) 40 MG tablet Take 1 tablet (40 mg total) by mouth daily. 90 tablet 3   phenazopyridine (PYRIDIUM) 95 MG tablet Take 95 mg by mouth 3 (three) times daily as needed for pain.     rosuvastatin (CRESTOR) 20 MG tablet Take 1 tablet (20 mg total) by mouth every evening. 90 tablet 3   ursodiol (ACTIGALL) 300 MG capsule Take 300 mg by mouth 2 (two) times daily.      No facility-administered medications prior to visit.     Allergies:   Ivp dye [iodinated diagnostic agents], Sulfonamide derivatives, Lipitor [atorvastatin calcium], Zetia [ezetimibe],  and Penicillins   Social History   Socioeconomic History   Marital status: Widowed    Spouse name: Not on file   Number of children: Not on file   Years of education: Not on file   Highest education level: Not on file  Occupational History   Not on file  Tobacco Use   Smoking status: Former    Packs/day: 2.00    Years: 40.00    Pack years: 80.00    Types: Cigarettes    Quit date: 06/05/1992    Years since quitting: 28.4   Smokeless tobacco: Former    Types: Loss adjuster, chartered  Quit date: 12/03/1992   Tobacco comments:    used tobacco to help quit smoking  Vaping Use   Vaping Use: Never used  Substance and Sexual Activity   Alcohol use: Never   Drug use: Never   Sexual activity: Not on file  Other Topics Concern   Not on file  Social History Narrative   Not on file   Social Determinants of Health   Financial Resource Strain: Not on file  Food Insecurity: Not on file  Transportation Needs: Not on file  Physical Activity: Not on file  Stress: Not on file  Social Connections: Not on file     Family History:  The patient's family history includes Heart failure in her mother; Prostate cancer in her brother and father.   Review of Systems:    Please see the history of present illness.     All other systems reviewed and are otherwise negative except as noted above.   Physical Exam:    VS:  BP 134/82   Pulse 81   Ht 5' 2"  (1.575 m)   Wt 162 lb 12.8 oz (73.8 kg)   SpO2 95%   BMI 29.78 kg/m    General: Pleasant, elderly female appearing in no acute distress. Head: Normocephalic, atraumatic. Neck: No carotid bruits. JVD not elevated.  Lungs: Respirations regular and unlabored, without wheezes or rales.  Heart: Regular rate and rhythm. No S3 or S4.  2/6 SEM along RUSB.  Abdomen: Appears non-distended. No obvious abdominal masses. Msk:  Strength and tone appear normal for age. No obvious joint deformities or effusions. Extremities: No clubbing or cyanosis. Trace lower  extremity edema.  Distal pedal pulses are 2+ bilaterally. Neuro: Alert and oriented X 3. Moves all extremities spontaneously. No focal deficits noted. Psych:  Responds to questions appropriately with a normal affect. Skin: No rashes or lesions noted  Wt Readings from Last 3 Encounters:  11/18/20 162 lb 12.8 oz (73.8 kg)  10/27/20 160 lb (72.6 kg)  05/09/20 156 lb 1.4 oz (70.8 kg)      Studies/Labs Reviewed:   EKG:  EKG is not ordered today.  Recent Labs: 05/11/2020: ALT 13; Magnesium 1.8 10/27/2020: BUN 21; Creatinine, Ser 0.63; Hemoglobin 12.9; Platelets 164; Potassium 3.7; Sodium 136   Lipid Panel    Component Value Date/Time   CHOL 116 09/30/2019 0404   TRIG 128 05/09/2020 1552   HDL 45 09/30/2019 0404   CHOLHDL 2.6 09/30/2019 0404   VLDL 21 09/30/2019 0404   LDLCALC 50 09/30/2019 0404    Additional studies/ records that were reviewed today include:   Cardiac Catheterization: 09/2018 Prox RCA to Mid RCA lesion is 30% stenosed. Mid RCA lesion is 30% stenosed. Previously placed Mid RCA to Dist RCA stent (unknown type) is widely patent. Dist RCA lesion is 99% stenosed. A drug-eluting stent was successfully placed using a STENT SYNERGY DES 3X12. Post intervention, there is a 0% residual stenosis. Ost 2nd Mrg lesion is 70% stenosed. Previously placed Ost LAD to Prox LAD stent (unknown type) is widely patent. Prox LAD lesion is 90% stenosed. A drug-eluting stent was successfully placed using a STENT SYNERGY DES 2.75X12. Post intervention, there is a 0% residual stenosis.   1. Severe stenosis mid LAD just beyond the patent stented segment. Successful PTCA/DES x 1 mid LAD 2. Severe stenosis distal RCA just beyond the patent stented segment. Successful PTCA/DES x 1 distal RCA 3. Moderate stenosis ostium of the small caliber second obtuse marginal branch.  Echocardiogram: 09/2019 IMPRESSIONS     1. Left ventricular ejection fraction, by estimation, is 55 to 60%. The   left ventricle has normal function. The left ventricle has no regional  wall motion abnormalities. There is mild left ventricular hypertrophy.  Left ventricular diastolic parameters  are consistent with Grade I diastolic dysfunction (impaired relaxation).   2. Right ventricular systolic function is normal. The right ventricular  size is normal. Tricuspid regurgitation signal is inadequate for assessing  PA pressure.   3. Left atrial size was mildly dilated.   4. The mitral valve is grossly normal. Trivial mitral valve  regurgitation.   5. The aortic valve is tricuspid. Moderate aortic valve stenosis. Aortic  valve area, by VTI measures 1.17 cm. Aortic valve mean gradient measures  11.3 mmHg. Aortic valve Vmax measures 2.24 m/s. Dimentionless index 0.46.   6. The inferior vena cava is normal in size with greater than 50%  respiratory variability, suggesting right atrial pressure of 3 mmHg.   7. No obvious PFO visualized.    Assessment:    1. Coronary artery disease involving native coronary artery of native heart without angina pectoris   2. History of ischemic cardiomyopathy   3. Moderate aortic stenosis   4. PAF (paroxysmal atrial fibrillation) ([place])   5. Essential hypertension      Plan:   In order of problems listed above:  1. CAD - She is s/p DES to distal RCA and DES to proximal LAD in 02/2018 and DES to mid-LAD and DES to dRCA in 09/2018.  - She denies any recent chest pain or dyspnea on exertion. - Continue current medical therapy including ASA 81 mg daily, Toprol-XL 25 mg daily and Crestor 20 mg daily.  2. History of Ischemic Cardiomyopathy - Her EF was previously at 40-45% by echo in 09/2018, normalized to 55-60% by repeat echo in 09/2019. - She does have trace lower extremity edema on examination today but lungs are clear. She is not currently on diuretic therapy and we reviewed elevating her lower extremities when able.  Also recommended limiting sodium  intake.  3. Aortic Stenosis - This was moderate by most recent echocardiogram in 2021 and seems to be in a moderate range by examination today. We discussed possibly obtaining a repeat echocardiogram later this year. I am unsure if she would be a TAVR candidate given her comorbidities and dementia but intervention is not currently indicated at this time.  4. Paroxysmal Atrial Fibrillation - She denies any recent palpitations and is in normal sinus rhythm by examination today.  Remains on Toprol-XL 25 mg daily for rate control and Eliquis 5 mg twice daily for anticoagulation which is the appropriate dose at this time given her age, weight and renal function (creatinine at 0.63 in 10/2020).  5. HTN - BP is well controlled at 134/82 during today's visit. Continue current medication regimen.   Medication Adjustments/Labs and Tests Ordered: Current medicines are reviewed at length with the patient today.  Concerns regarding medicines are outlined above.  Medication changes, Labs and Tests ordered today are listed in the Patient Instructions below. Patient Instructions  Medication Instructions:  Your physician recommends that you continue on your current medications as directed. Please refer to the Current Medication list given to you today.  *If you need a refill on your cardiac medications before your next appointment, please call your pharmacy*   Lab Work: None If you have labs (blood work) drawn today and your tests are completely normal, you will  receive your results only by: [place] (if you have MyChart) OR A paper copy in the mail If you have any lab test that is abnormal or we need to change your treatment, we will call you to review the results.   Follow-Up: At [facility], you and your health needs are our priority.  As part of our continuing mission to provide you with exceptional heart care, we have created designated Provider Care Teams.  These Care Teams include your  primary Cardiologist (physician) and Advanced Practice Providers (APPs -  Physician Assistants and Nurse Practitioners) who all work together to provide you with the care you need, when you need it.  We recommend signing up for the patient portal called "MyChart".  Sign up information is provided on this After Visit Summary.  MyChart is used to connect with patients for Virtual Visits (Telemedicine).  Patients are able to view lab/test results, encounter notes, upcoming appointments, etc.  Non-urgent messages can be sent to your provider as well.   To learn more about what you can do with MyChart, go to [email or web address].    Your next appointment:   6 month(s)  The format for your next appointment:   In Person  Provider:   You may see Dr. Domenic Polite or one of the following Advanced Practice Providers on your designated Care Team:   Bernerd Pho, PA-C  Ermalinda Barrios, PA-C    Signed, Erma Heritage, [place]  11/18/2020 5:23 PM    [facility] [address] [place], [place] [address] Phone: [phone] Fax: [phone]

## 2020-11-18 ENCOUNTER — Ambulatory Visit (INDEPENDENT_AMBULATORY_CARE_PROVIDER_SITE_OTHER): Payer: Medicare Other | Admitting: Student

## 2020-11-18 ENCOUNTER — Other Ambulatory Visit: Payer: Self-pay

## 2020-11-18 ENCOUNTER — Encounter: Payer: Self-pay | Admitting: Student

## 2020-11-18 VITALS — BP 134/82 | HR 81 | Ht 62.0 in | Wt 162.8 lb

## 2020-11-18 DIAGNOSIS — Z8679 Personal history of other diseases of the circulatory system: Secondary | ICD-10-CM

## 2020-11-18 DIAGNOSIS — I2510 Atherosclerotic heart disease of native coronary artery without angina pectoris: Secondary | ICD-10-CM

## 2020-11-18 DIAGNOSIS — I350 Nonrheumatic aortic (valve) stenosis: Secondary | ICD-10-CM

## 2020-11-18 DIAGNOSIS — I480 Paroxysmal atrial fibrillation: Secondary | ICD-10-CM

## 2020-11-18 DIAGNOSIS — I10 Essential (primary) hypertension: Secondary | ICD-10-CM

## 2020-11-18 NOTE — Patient Instructions (Signed)
Medication Instructions:  Your physician recommends that you continue on your current medications as directed. Please refer to the Current Medication list given to you today.  *If you need a refill on your cardiac medications before your next appointment, please call your pharmacy*   Lab Work: None If you have labs (blood work) drawn today and your tests are completely normal, you will receive your results only by: [place] (if you have MyChart) OR A paper copy in the mail If you have any lab test that is abnormal or we need to change your treatment, we will call you to review the results.   Follow-Up: At [facility], you and your health needs are our priority.  As part of our continuing mission to provide you with exceptional heart care, we have created designated Provider Care Teams.  These Care Teams include your primary Cardiologist (physician) and Advanced Practice Providers (APPs -  Physician Assistants and Nurse Practitioners) who all work together to provide you with the care you need, when you need it.  We recommend signing up for the patient portal called "MyChart".  Sign up information is provided on this After Visit Summary.  MyChart is used to connect with patients for Virtual Visits (Telemedicine).  Patients are able to view lab/test results, encounter notes, upcoming appointments, etc.  Non-urgent messages can be sent to your provider as well.   To learn more about what you can do with MyChart, go to [email or web address].    Your next appointment:   6 month(s)  The format for your next appointment:   In Person  Provider:   You may see Dr. Domenic Polite or one of the following Advanced Practice Providers on your designated Care Team:   [place], PA-C  Ermalinda Barrios, [place]

## 2021-04-05 DEATH — deceased

## 2021-05-23 ENCOUNTER — Ambulatory Visit: Payer: Medicare Other | Admitting: Cardiology
